# Patient Record
Sex: Female | Born: 1980 | Race: White | Hispanic: No | Marital: Married | Smoking: Never smoker
Health system: Southern US, Community
[De-identification: ages and names within clinical notes are randomized; demographics above are authoritative.]

## PROBLEM LIST (undated history)

## (undated) DIAGNOSIS — R0982 Postnasal drip: Secondary | ICD-10-CM

## (undated) DIAGNOSIS — E119 Type 2 diabetes mellitus without complications: Secondary | ICD-10-CM

## (undated) DIAGNOSIS — D219 Benign neoplasm of connective and other soft tissue, unspecified: Secondary | ICD-10-CM

## (undated) DIAGNOSIS — M7061 Trochanteric bursitis, right hip: Secondary | ICD-10-CM

## (undated) DIAGNOSIS — R1013 Epigastric pain: Secondary | ICD-10-CM

## (undated) DIAGNOSIS — G4700 Insomnia, unspecified: Secondary | ICD-10-CM

## (undated) DIAGNOSIS — Q782 Osteopetrosis: Secondary | ICD-10-CM

## (undated) DIAGNOSIS — R1012 Left upper quadrant pain: Secondary | ICD-10-CM

## (undated) DIAGNOSIS — J309 Allergic rhinitis, unspecified: Secondary | ICD-10-CM

## (undated) DIAGNOSIS — M545 Low back pain, unspecified: Secondary | ICD-10-CM

## (undated) DIAGNOSIS — R718 Other abnormality of red blood cells: Secondary | ICD-10-CM

## (undated) DIAGNOSIS — S92901A Unspecified fracture of right foot, initial encounter for closed fracture: Secondary | ICD-10-CM

## (undated) DIAGNOSIS — T7840XA Allergy, unspecified, initial encounter: Secondary | ICD-10-CM

## (undated) DIAGNOSIS — E282 Polycystic ovarian syndrome: Secondary | ICD-10-CM

## (undated) DIAGNOSIS — R519 Headache, unspecified: Secondary | ICD-10-CM

## (undated) DIAGNOSIS — Z6841 Body Mass Index (BMI) 40.0 and over, adult: Secondary | ICD-10-CM

## (undated) DIAGNOSIS — R7301 Impaired fasting glucose: Secondary | ICD-10-CM

## (undated) DIAGNOSIS — K219 Gastro-esophageal reflux disease without esophagitis: Secondary | ICD-10-CM

## (undated) HISTORY — DX: Polycystic ovarian syndrome: E28.2

## (undated) HISTORY — DX: Postnasal drip: R09.82

## (undated) HISTORY — DX: Allergic rhinitis, unspecified: J30.9

## (undated) HISTORY — DX: Unspecified fracture of right foot, initial encounter for closed fracture: S92.901A

## (undated) HISTORY — DX: Low back pain, unspecified: M54.50

## (undated) HISTORY — PX: NASAL SINUS SURGERY: SHX719

## (undated) HISTORY — PX: ABDOMINAL HYSTERECTOMY: SHX81

## (undated) HISTORY — DX: Other abnormality of red blood cells: R71.8

## (undated) HISTORY — DX: Body Mass Index (BMI) 40.0 and over, adult: Z684

## (undated) HISTORY — DX: Epigastric pain: R10.13

## (undated) HISTORY — DX: Benign neoplasm of connective and other soft tissue, unspecified: D21.9

## (undated) HISTORY — DX: Left upper quadrant pain: R10.12

## (undated) HISTORY — DX: Morbid (severe) obesity due to excess calories: E66.01

## (undated) HISTORY — DX: Osteopetrosis: Q78.2

## (undated) HISTORY — DX: Trochanteric bursitis, right hip: M70.61

## (undated) HISTORY — DX: Gastro-esophageal reflux disease without esophagitis: K21.9

## (undated) HISTORY — DX: Allergy, unspecified, initial encounter: T78.40XA

## (undated) HISTORY — DX: Impaired fasting glucose: R73.01

## (undated) HISTORY — DX: Low back pain: M54.5

## (undated) HISTORY — DX: Insomnia, unspecified: G47.00

---

## 2014-06-09 ENCOUNTER — Encounter: Payer: Self-pay | Admitting: Family Medicine

## 2014-06-25 ENCOUNTER — Encounter: Payer: Self-pay | Admitting: Family Medicine

## 2014-08-19 ENCOUNTER — Ambulatory Visit: Payer: Self-pay | Admitting: Family Medicine

## 2014-08-29 ENCOUNTER — Ambulatory Visit: Payer: Self-pay | Admitting: Unknown Physician Specialty

## 2015-02-11 ENCOUNTER — Ambulatory Visit: Payer: Self-pay | Admitting: Family Medicine

## 2015-04-08 DIAGNOSIS — G4700 Insomnia, unspecified: Secondary | ICD-10-CM | POA: Insufficient documentation

## 2015-04-08 DIAGNOSIS — M545 Low back pain, unspecified: Secondary | ICD-10-CM | POA: Insufficient documentation

## 2015-04-08 DIAGNOSIS — R7301 Impaired fasting glucose: Secondary | ICD-10-CM | POA: Insufficient documentation

## 2015-04-08 DIAGNOSIS — J309 Allergic rhinitis, unspecified: Secondary | ICD-10-CM | POA: Insufficient documentation

## 2015-04-08 DIAGNOSIS — E282 Polycystic ovarian syndrome: Secondary | ICD-10-CM | POA: Insufficient documentation

## 2015-07-06 ENCOUNTER — Ambulatory Visit (INDEPENDENT_AMBULATORY_CARE_PROVIDER_SITE_OTHER): Payer: 59 | Admitting: Family Medicine

## 2015-07-06 ENCOUNTER — Encounter: Payer: Self-pay | Admitting: Family Medicine

## 2015-07-06 VITALS — BP 105/74 | HR 85 | Wt 238.0 lb

## 2015-07-06 DIAGNOSIS — M545 Low back pain, unspecified: Secondary | ICD-10-CM

## 2015-07-06 DIAGNOSIS — J301 Allergic rhinitis due to pollen: Secondary | ICD-10-CM

## 2015-07-06 DIAGNOSIS — G4700 Insomnia, unspecified: Secondary | ICD-10-CM

## 2015-07-06 DIAGNOSIS — Q782 Osteopetrosis: Secondary | ICD-10-CM

## 2015-07-06 MED ORDER — BUDESONIDE 32 MCG/ACT NA SUSP
1.0000 | Freq: Every day | NASAL | Status: DC
Start: 2015-07-06 — End: 2017-06-08

## 2015-07-06 NOTE — Progress Notes (Signed)
BP 105/74 mmHg  Pulse 85  Wt 238 lb (107.956 kg)  SpO2 99%  LMP  (LMP Unknown)   Subjective:    Patient ID: Connie Sims, female    DOB: 10/14/1981, 34 y.o.   MRN: [id number]  HPI: Connie Sims is a 34 y.o. female  Chief Complaint  Patient presents with  . Insomnia   Not sleeping well; she goes to bed at 3:00 or 3:30 am and then goes to bed; lies there awake after talking to friend in [place]; tried Nyquil sleep aide which did not help; natural homeopathic relief has helped only a little bit; she works night shift, so that's part of the underlying problem; gets up 9 or 10 am; this has been long term, going to bed at 3 am and up at 10 am; just recently in the last week having trouble; total sleep has decreased; her back has been hurting; stayed home from work all last week because of her back; she was in bed most of last week with her back and did not feel safe going to work on 2 hours of sleep a night; she operates machinery at work; ball of pain in the left side; that comes and goes; moves one direction and there it is; lays there and waits for it to go away; she is going to chiropractor on Wednesday; she is not sure that the back pain is causing her to not sleep well; she has a new mattress just purchased in Sept; drinks caffeine; drinks two bottles of pepsi a day, 40 ounces total; she does not get jittery from caffeine; just likes soda; has last around 6 to 7 pm; tried melatonin but did not take at same time of night; not using much OTC medicine; no blood in the urine; pain level at worst 7 out of 10  Obesity; she is down 9 pounds from April, limiting intake some; not doing any walking; still drinking soda; no loose stools; tired right now; not routinely shaky or jittery  Relevant past medical, surgical, family and social history reviewed and updated as indicated. Interim medical history since our last visit reviewed. Allergies and medications reviewed and updated.  Review of Systems  Per  HPI unless specifically indicated above     Objective:    BP 105/74 mmHg  Pulse 85  Wt 238 lb (107.956 kg)  SpO2 99%  LMP  (LMP Unknown)  Wt Readings from Last 3 Encounters:  07/06/15 238 lb (107.956 kg)  04/08/15 247 lb (112.038 kg)    Physical Exam  Constitutional: She appears well-developed and well-nourished. No distress.  HENT:  Head: Normocephalic and atraumatic.  Eyes: EOM are normal. No scleral icterus.  Neck: No thyromegaly present.  Cardiovascular: Normal rate.   No murmur heard. Pulmonary/Chest: Effort normal. No respiratory distress. She has no wheezes.  Abdominal: Soft. She exhibits no distension.  Musculoskeletal: Normal range of motion. She exhibits no edema.       Lumbar back: She exhibits tenderness and pain. She exhibits normal range of motion, no bony tenderness, no swelling, no edema, no deformity and no spasm.  Neurological: She is alert. She displays normal reflexes. She exhibits normal muscle tone. Coordination normal.  Skin: Skin is warm and dry. She is not diaphoretic. No pallor.  Psychiatric: She has a normal mood and affect. Her behavior is normal. Judgment and thought content normal.    No results found for this or any previous visit.    Assessment & Plan:  Problem List Items Addressed This Visit      Respiratory   Allergic rhinitis    Trial of lower dose corticosteroid for symptoms at her request; see ENT        Musculoskeletal and Integument   Osteopetrosis    Noted; added to problem list        Other   Insomnia - Primary    Do not ever drive if tired; try melatonin 3 mg at bedtime exact same time of night for 3 weeks; avoid caffeine for 10 hours prior to bedtime; order sleep study for possible OSA      Relevant Orders   Ambulatory referral to ENT   Low back pain    Use aleve and see chiropractor          Follow up plan: Return in about 1 month (around 08/06/2015).

## 2015-07-06 NOTE — Assessment & Plan Note (Signed)
Trial of lower dose corticosteroid for symptoms at her request; see ENT

## 2015-07-06 NOTE — Assessment & Plan Note (Signed)
Do not ever drive if tired; try melatonin 3 mg at bedtime exact same time of night for 3 weeks; avoid caffeine for 10 hours prior to bedtime; order sleep study for possible OSA

## 2015-07-06 NOTE — Assessment & Plan Note (Signed)
Use aleve and see chiropractor

## 2015-07-06 NOTE — Patient Instructions (Addendum)
I'll recommend no caffeine within 10 hours of your anticipated bed time Try melatonin 3 mg at the exact same time of night for 3 weeks to reset your pineal gland Please do see your chiropractor for back pain I recommend over-the-counter naproxen (Aleve), 220 mg tablets and you can take one or two at a time every twelve hours; take with food, easier on the stomach I recommend no electronics in the bedroom Do not ever operate machinery within 8 hours of using muscle relaxants Proper lifting Keep up weight loss efforts I'll suggest cutting out soda completely or caffeine-free beverages GINA -- note for being out of work all last week and today Never drive when tired We'll have you see Dr. Tami Ribas for possible sleep apnea

## 2015-07-06 NOTE — Assessment & Plan Note (Signed)
Noted added to problem list.

## 2015-07-16 ENCOUNTER — Telehealth: Payer: Self-pay | Admitting: Family Medicine

## 2015-07-16 NOTE — Telephone Encounter (Signed)
Routing to provider  

## 2015-07-16 NOTE — Telephone Encounter (Signed)
Pt didn't want to come in for an appt she just basically wanted meds since we had seen her before for sleeping issues. She would like a call back at the number provided.

## 2015-07-17 MED ORDER — TRAZODONE HCL 50 MG PO TABS: 50.0000 mg | ORAL_TABLET | Freq: Every evening | ORAL | Status: DC | PRN

## 2015-07-17 NOTE — Telephone Encounter (Signed)
Rx for trazodone sent In addition to the medicine, avoid caffeine for 6-10 hours prior to bed, avoid electronics for 2 hours before bed, etc.

## 2015-07-17 NOTE — Telephone Encounter (Signed)
Patient notified

## 2015-07-30 ENCOUNTER — Encounter: Payer: Self-pay | Admitting: Family Medicine

## 2015-07-30 ENCOUNTER — Ambulatory Visit (INDEPENDENT_AMBULATORY_CARE_PROVIDER_SITE_OTHER): Payer: 59 | Admitting: Family Medicine

## 2015-07-30 VITALS — BP 119/78 | HR 66 | Temp 97.7°F | Wt 239.6 lb

## 2015-07-30 DIAGNOSIS — G4700 Insomnia, unspecified: Secondary | ICD-10-CM

## 2015-07-30 MED ORDER — TRAZODONE HCL 100 MG PO TABS: 100.0000 mg | ORAL_TABLET | Freq: Every evening | ORAL | Status: DC | PRN

## 2015-07-30 NOTE — Patient Instructions (Signed)
Insomnia Insomnia is frequent trouble falling and/or staying asleep. Insomnia can be a long term problem or a short term problem. Both are common. Insomnia can be a short term problem when the wakefulness is related to a certain stress or worry. Long term insomnia is often related to ongoing stress during waking hours and/or poor sleeping habits. Overtime, sleep deprivation itself can make the problem worse. Every little thing feels more severe because you are overtired and your ability to cope is decreased. CAUSES   Stress, anxiety, and depression.  Poor sleeping habits.  Distractions such as TV in the bedroom.  Naps close to bedtime.  Engaging in emotionally charged conversations before bed.  Technical reading before sleep.  Alcohol and other sedatives. They may make the problem worse. They can hurt normal sleep patterns and normal dream activity.  Stimulants such as caffeine for several hours prior to bedtime.  Pain syndromes and shortness of breath can cause insomnia.  Exercise late at night.  Changing time zones may cause sleeping problems (jet lag). It is sometimes helpful to have someone observe your sleeping patterns. They should look for periods of not breathing during the night (sleep apnea). They should also look to see how long those periods last. If you live alone or observers are uncertain, you can also be observed at a sleep clinic where your sleep patterns will be professionally monitored. Sleep apnea requires a checkup and treatment. Give your caregivers your medical history. Give your caregivers observations your family has made about your sleep.  SYMPTOMS   Not feeling rested in the morning.  Anxiety and restlessness at bedtime.  Difficulty falling and staying asleep. TREATMENT   Your caregiver may prescribe treatment for an underlying medical disorders. Your caregiver can give advice or help if you are using alcohol or other drugs for self-medication. Treatment  of underlying problems will usually eliminate insomnia problems.  Medications can be prescribed for short time use. They are generally not recommended for lengthy use.  Over-the-counter sleep medicines are not recommended for lengthy use. They can be habit forming.  You can promote easier sleeping by making lifestyle changes such as:  Using relaxation techniques that help with breathing and reduce muscle tension.  Exercising earlier in the day.  Changing your diet and the time of your last meal. No night time snacks.  Establish a regular time to go to bed.  Counseling can help with stressful problems and worry.  Soothing music and white noise may be helpful if there are background noises you cannot remove.  Stop tedious detailed work at least one hour before bedtime. HOME CARE INSTRUCTIONS   Keep a diary. Inform your caregiver about your progress. This includes any medication side effects. See your caregiver regularly. Take note of:  Times when you are asleep.  Times when you are awake during the night.  The quality of your sleep.  How you feel the next day. This information will help your caregiver care for you.  Get out of bed if you are still awake after 15 minutes. Read or do some quiet activity. Keep the lights down. Wait until you feel sleepy and go back to bed.  Keep regular sleeping and waking hours. Avoid naps.  Exercise regularly.  Avoid distractions at bedtime. Distractions include watching television or engaging in any intense or detailed activity like attempting to balance the household checkbook.  Develop a bedtime ritual. Keep a familiar routine of bathing, brushing your teeth, climbing into bed at the same   time each night, listening to soothing music. Routines increase the success of falling to sleep faster.  Use relaxation techniques. This can be using breathing and muscle tension release routines. It can also include visualizing peaceful scenes. You can  also help control troubling or intruding thoughts by keeping your mind occupied with boring or repetitive thoughts like the old concept of counting sheep. You can make it more creative like imagining planting one beautiful flower after another in your backyard garden.  During your day, work to eliminate stress. When this is not possible use some of the previous suggestions to help reduce the anxiety that accompanies stressful situations. MAKE SURE YOU:   Understand these instructions.  Will watch your condition.  Will get help right away if you are not doing well or get worse. Document Released: 12/09/2000 Document Revised: 03/05/2012 Document Reviewed: 01/09/2008 Exit[employer] Patient Information 2015 [employer]. This information is not intended to replace advice given to you by your health care provider. Make sure you discuss any questions you have with your health care provider.  

## 2015-07-30 NOTE — Assessment & Plan Note (Signed)
Discussed that 15-30 minutes after taking the medicine she should lay down in a dark room as the medicine will not act as a "sledge hammer", but will make it easier for her to fall asleep. We will increase her trazodone to 100mg  to see if that has an effect. She will see ENT for OSA evaluation. She will also follow up with PCP next week to see how increased dose is doing and if not doing well to discuss other options.

## 2015-07-30 NOTE — Progress Notes (Signed)
BP 119/78 mmHg  Pulse 66  Temp(Src) 97.7 F (36.5 C)  Wt 239 lb 9.6 oz (108.682 kg)  SpO2 99%  LMP 07/16/2015 (Approximate)   Subjective:    Patient ID: Connie Sims, female    DOB: [birth year], 34 y.o.   MRN: [id number]  HPI: Connie Sims is a 34 y.o. female  Chief Complaint  Patient presents with  . Insomnia   INSOMNIA- has not seen the ENT yet. Medicine is making her drowsy but not helping her sleep. Has been watching caffeine intake. Notes that she cannot get her mind to shut down. Denies any extra stress, notes that she has been feeling better and things are winding down. States that she is still only getting about 3-4 hours of sleep a night and that she is very frustrated. Wants something to help her sleep. She notes that she takes the trazodone and then has been doing quiet activities for about an hour while she waits for the medicine to "kick in". She notes that it doesn't feel like it does anything but make her mildly drowsy. She has taken it with benadryl, she has taken it with anti-histamines. Denies that these have ever had any type of effect on her.  Duration: 4-5 weeks Satisfied with sleep quality: no Difficulty falling asleep: yes Difficulty staying asleep: yes Waking a few hours after sleep onset: yes Early morning awakenings: no Daytime hypersomnolence: yes Wakes feeling refreshed: no Good sleep hygiene: no Depressed/anxious mood: no Recent stress: no Restless legs/nocturnal leg cramps: no Chronic pain/arthritis: no History of sleep study: no Treatments attempted: trazodone, melatonin, uinsom and benadryl   Relevant past medical, surgical, family and social history reviewed and updated as indicated. Interim medical history since our last visit reviewed. Allergies and medications reviewed and updated.  Review of Systems  Constitutional: Negative.   Respiratory: Negative.   Cardiovascular: Negative.   Psychiatric/Behavioral: Negative.     Per HPI unless  specifically indicated above     Objective:    BP 119/78 mmHg  Pulse 66  Temp(Src) 97.7 F (36.5 C)  Wt 239 lb 9.6 oz (108.682 kg)  SpO2 99%  LMP 07/16/2015 (Approximate)  Wt Readings from Last 3 Encounters:  07/30/15 239 lb 9.6 oz (108.682 kg)  07/06/15 238 lb (107.956 kg)  04/08/15 247 lb (112.038 kg)    Physical Exam  Constitutional: She is oriented to person, place, and time. She appears well-developed and well-nourished. No distress.  HENT:  Head: Normocephalic and atraumatic.  Right Ear: Hearing normal.  Left Ear: Hearing normal.  Nose: Nose normal.  Eyes: Conjunctivae and lids are normal. Right eye exhibits no discharge. Left eye exhibits no discharge. No scleral icterus.  Cardiovascular: Normal rate, regular rhythm and normal heart sounds.  Exam reveals no gallop and no friction rub.   No murmur heard. Pulmonary/Chest: Effort normal and breath sounds normal. No respiratory distress. She has no wheezes. She has no rales. She exhibits no tenderness.  Musculoskeletal: Normal range of motion.  Neurological: She is alert and oriented to person, place, and time.  Skin: Skin is warm, dry and intact. No rash noted. No erythema. No pallor.  Psychiatric: She has a normal mood and affect. Her speech is normal and behavior is normal. Judgment and thought content normal. Cognition and memory are normal.  Nursing note and vitals reviewed.   No results found for this or any previous visit.    Assessment & Plan:   Problem List Items Addressed This Visit  Other   Insomnia - Primary    Discussed that 15-30 minutes after taking the medicine she should lay down in a dark room as the medicine will not act as a "sledge hammer", but will make it easier for her to fall asleep. We will increase her trazodone to 100mg  to see if that has an effect. She will see ENT for OSA evaluation. She will also follow up with PCP next week to see how increased dose is doing and if not doing well to  discuss other options.           Follow up plan: Return for as scheduled with Dr. Sanda Klein.

## 2015-08-06 ENCOUNTER — Encounter: Payer: Self-pay | Admitting: Family Medicine

## 2015-08-06 ENCOUNTER — Ambulatory Visit (INDEPENDENT_AMBULATORY_CARE_PROVIDER_SITE_OTHER): Payer: 59 | Admitting: Family Medicine

## 2015-08-06 VITALS — BP 108/72 | HR 69 | Temp 97.0°F | Wt 244.0 lb

## 2015-08-06 DIAGNOSIS — G4700 Insomnia, unspecified: Secondary | ICD-10-CM

## 2015-08-06 MED ORDER — TRAZODONE HCL 100 MG PO TABS: 50.0000 mg | ORAL_TABLET | Freq: Every evening | ORAL | Status: DC | PRN

## 2015-08-06 MED ORDER — RAMELTEON 8 MG PO TABS: 8.0000 mg | ORAL_TABLET | Freq: Every evening | ORAL | Status: DC | PRN

## 2015-08-06 NOTE — Assessment & Plan Note (Addendum)
The trial of increased trazodone did not work; also having morning headaches; back that down to 50 mg at bedtime and add Rozerem; send to [place]; sleep hygiene discussed, see AVS for additional recommendations; she will call in about 10 days with report; we can try Belsomra or have her see sleep specialist if needed; she will consider

## 2015-08-06 NOTE — Progress Notes (Signed)
BP 108/72 mmHg  Pulse 69  Temp(Src) 97 F (36.1 C)  Wt 244 lb (110.678 kg)  SpO2 98%  LMP 07/16/2015 (Approximate)   Subjective:    Patient ID: Connie Sims, female    DOB: 1981/05/25, 34 y.o.   MRN: [id number]  HPI: Alvetta Hidrogo is a 34 y.o. female  Chief Complaint  Patient presents with  . Insomnia   She goes to bed at 3 am and gets up around 9 am; she is always more awake at night than in the morning; she saw Dr. Wynetta Emery last week and Dr. 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; same work schedule for ten years She used to go to bed at 3 am and sleep for 6-7 hours and have good sleep; this was an abrupt onset on a Friday There was no change in habits or work No headache, no slurred speech; no weakness in extremity, just numb in arm when she lays on it wrong  Duration: 6 weeks Satisfied with sleep quality: not ideal, not getting restorative sleep she is supposed to get; not staring at the ceiling now like she was Difficulty falling asleep: yes; she takes the pill every night at 2 am; some nights get to sleep at 3 am but some nights it is 4 am Difficulty staying asleep: yes Waking a few hours after sleep onset: yes Early morning awakenings: occasionally Daytime hypersomnolence: yes  Wakes feeling refreshed: no Good sleep hygiene: fair Depressed/anxious mood: no Recent stress: should be able to file for divorce next month Restless legs/nocturnal leg cramps: no Chronic pain/arthritis: no History of sleep study: no Treatments attempted: trazodone, melatonin, uinsom and benadryl  She has not heard from ENT yet She cannot shut her brain off sometimes; keeps pad of paper to keep list of things to get it out of her head She is interested in trying Rozerom  Relevant past medical, surgical, family and social history reviewed and updated as indicated. Interim medical  history since our last visit reviewed. Allergies and medications reviewed and updated.  Review of Systems  Constitutional: Positive for unexpected weight change (weight fluctuates by five to eight pounds pretty routinely).  Psychiatric/Behavioral: Positive for sleep disturbance. Negative for suicidal ideas, hallucinations, behavioral problems, confusion, self-injury, dysphoric mood, decreased concentration and agitation. The patient is not nervous/anxious and is not hyperactive.     Per HPI unless specifically indicated above     Objective:    BP 108/72 mmHg  Pulse 69  Temp(Src) 97 F (36.1 C)  Wt 244 lb (110.678 kg)  SpO2 98%  LMP 07/16/2015 (Approximate)  Wt Readings from Last 3 Encounters:  08/06/15 244 lb (110.678 kg)  07/30/15 239 lb 9.6 oz (108.682 kg)  07/06/15 238 lb (107.956 kg)    Physical Exam  Constitutional: She appears well-developed and well-nourished. No distress.  Eyes: EOM are normal. No scleral icterus.  Neck: No thyromegaly present.  Cardiovascular: Normal rate and regular rhythm.   Pulmonary/Chest: Effort normal and breath sounds normal.  Abdominal: She exhibits no distension.  Skin: No pallor.  Psychiatric: She has a normal mood and affect. Her behavior is normal. Judgment and thought content normal.    No results found for this or any previous visit.    Assessment & Plan:   Problem List Items Addressed This Visit      Other   Insomnia - Primary  The trial of increased trazodone did not work; also having morning headaches; back that down to 50 mg at bedtime and add Rozerem; send to [place]; sleep hygiene discussed, see AVS for additional recommendations; she will call in about 10 days with report; we can try Belsomra or have her see sleep specialist if needed; she will consider         Follow up plan: Return if symptoms worsen or fail to improve.  An after-visit summary was printed and given to the patient at [place].  Please see the  patient instructions which may contain other information and recommendations beyond what is mentioned above in the assessment and plan.  Patient was given info about ENT referral by our referral coordinator; she has an outstanding balance and will need to settle up with them  Meds ordered this encounter  Medications  . traZODone (DESYREL) 100 MG tablet    Sig: Take 0.5 tablets (50 mg total) by mouth at bedtime as needed for sleep.    Dispense:  15 tablet    Refill:  0    New lower dose; may leave on file if she does not need this filled yet  . ramelteon (ROZEREM) 8 MG tablet    Sig: Take 1 tablet (8 mg total) by mouth at bedtime as needed for sleep.    Dispense:  30 tablet    Refill:  0

## 2015-08-06 NOTE — Patient Instructions (Addendum)
Decrease trazodone back down to 50 mg at bedtime and we'll likely discontinue that in the near future Start Rozerem before bed Call me with an update in 10 days or so We can consider Belsomra We can also consider referral to a sleep specialist Avoid caffeine for 6-10 hours prior to sleep Continue good sleep hygiene  Insomnia Insomnia is frequent trouble falling and/or staying asleep. Insomnia can be a long term problem or a short term problem. Both are common. Insomnia can be a short term problem when the wakefulness is related to a certain stress or worry. Long term insomnia is often related to ongoing stress during waking hours and/or poor sleeping habits. Overtime, sleep deprivation itself can make the problem worse. Every little thing feels more severe because you are overtired and your ability to cope is decreased. CAUSES   Stress, anxiety, and depression.  Poor sleeping habits.  Distractions such as TV in the bedroom.  Naps close to bedtime.  Engaging in emotionally charged conversations before bed.  Technical reading before sleep.  Alcohol and other sedatives. They may make the problem worse. They can hurt normal sleep patterns and normal dream activity.  Stimulants such as caffeine for several hours prior to bedtime.  Pain syndromes and shortness of breath can cause insomnia.  Exercise late at night.  Changing time zones may cause sleeping problems (jet lag). It is sometimes helpful to have someone observe your sleeping patterns. They should look for periods of not breathing during the night (sleep apnea). They should also look to see how long those periods last. If you live alone or observers are uncertain, you can also be observed at a sleep clinic where your sleep patterns will be professionally monitored. Sleep apnea requires a checkup and treatment. Give your caregivers your medical history. Give your caregivers observations your family has made about your sleep.   SYMPTOMS   Not feeling rested in the morning.  Anxiety and restlessness at bedtime.  Difficulty falling and staying asleep. TREATMENT   Your caregiver may prescribe treatment for an underlying medical disorders. Your caregiver can give advice or help if you are using alcohol or other drugs for self-medication. Treatment of underlying problems will usually eliminate insomnia problems.  Medications can be prescribed for short time use. They are generally not recommended for lengthy use.  Over-the-counter sleep medicines are not recommended for lengthy use. They can be habit forming.  You can promote easier sleeping by making lifestyle changes such as:  Using relaxation techniques that help with breathing and reduce muscle tension.  Exercising earlier in the day.  Changing your diet and the time of your last meal. No night time snacks.  Establish a regular time to go to bed.  Counseling can help with stressful problems and worry.  Soothing music and white noise may be helpful if there are background noises you cannot remove.  Stop tedious detailed work at least one hour before bedtime. HOME CARE INSTRUCTIONS   Keep a diary. Inform your caregiver about your progress. This includes any medication side effects. See your caregiver regularly. Take note of:  Times when you are asleep.  Times when you are awake during the night.  The quality of your sleep.  How you feel the next day. This information will help your caregiver care for you.  Get out of bed if you are still awake after 15 minutes. Read or do some quiet activity. Keep the lights down. Wait until you feel sleepy and go back  to bed.  Keep regular sleeping and waking hours. Avoid naps.  Exercise regularly.  Avoid distractions at bedtime. Distractions include watching television or engaging in any intense or detailed activity like attempting to balance the household checkbook.  Develop a bedtime ritual. Keep a  familiar routine of bathing, brushing your teeth, climbing into bed at the same time each night, listening to soothing music. Routines increase the success of falling to sleep faster.  Use relaxation techniques. This can be using breathing and muscle tension release routines. It can also include visualizing peaceful scenes. You can also help control troubling or intruding thoughts by keeping your mind occupied with boring or repetitive thoughts like the old concept of counting sheep. You can make it more creative like imagining planting one beautiful flower after another in your backyard garden.  During your day, work to eliminate stress. When this is not possible use some of the previous suggestions to help reduce the anxiety that accompanies stressful situations. MAKE SURE YOU:   Understand these instructions.  Will watch your condition.  Will get help right away if you are not doing well or get worse. Document Released: 12/09/2000 Document Revised: 03/05/2012 Document Reviewed: 01/09/2008 [facility] Patient Information 2015 [place], [place]. This information is not intended to replace advice given to you by your health care provider. Make sure you discuss any questions you have with your health care provider.

## 2015-08-07 ENCOUNTER — Telehealth: Payer: Self-pay | Admitting: Family Medicine

## 2015-08-07 MED ORDER — ZALEPLON 5 MG PO CAPS: 5.0000 mg | ORAL_CAPSULE | Freq: Every evening | ORAL | Status: DC | PRN

## 2015-08-07 NOTE — Telephone Encounter (Signed)
Pt called stated Rozrem was declined by her insurance, wants to know if something different can be called in. Pharm is [facility] in [place]. Thanks.

## 2015-08-07 NOTE — Telephone Encounter (Signed)
Yes, we'll try another one called [facility] ready to manually fax to [place] Let her know we'll try this; call with any problems

## 2015-08-07 NOTE — Telephone Encounter (Signed)
Spoke with patient, informed her of medication Zaleplon (Sonata) 5 mg Capsule has been faxed to [place], patient verbalized understanding.

## 2015-08-07 NOTE — Telephone Encounter (Signed)
Forward to provider

## 2015-09-01 ENCOUNTER — Telehealth: Payer: Self-pay | Admitting: Family Medicine

## 2015-09-01 MED ORDER — ZALEPLON 10 MG PO CAPS: 10.0000 mg | ORAL_CAPSULE | Freq: Every evening | ORAL | Status: DC | PRN

## 2015-09-01 NOTE — Telephone Encounter (Signed)
Routing to provider  

## 2015-09-01 NOTE — Telephone Encounter (Signed)
Pt called stated the Trazadone is causing the pt to have headaches, would like to try something different. Pharm is [facility] in [place]. Thanks.

## 2015-09-01 NOTE — Telephone Encounter (Signed)
STOP the trazodone completely Increase sleeping pill from 5 mg to 10 mg at bedtime, new rx ready to send

## 2015-09-02 NOTE — Telephone Encounter (Signed)
Left message to call.

## 2015-09-02 NOTE — Telephone Encounter (Signed)
Patient notified

## 2015-10-23 ENCOUNTER — Ambulatory Visit (INDEPENDENT_AMBULATORY_CARE_PROVIDER_SITE_OTHER): Payer: 59 | Admitting: Family Medicine

## 2015-10-23 ENCOUNTER — Encounter: Payer: Self-pay | Admitting: Family Medicine

## 2015-10-23 ENCOUNTER — Telehealth: Payer: Self-pay

## 2015-10-23 VITALS — BP 111/77 | HR 69 | Temp 97.6°F | Ht 65.75 in | Wt 239.8 lb

## 2015-10-23 DIAGNOSIS — G4700 Insomnia, unspecified: Secondary | ICD-10-CM | POA: Diagnosis not present

## 2015-10-23 DIAGNOSIS — E781 Pure hyperglyceridemia: Secondary | ICD-10-CM

## 2015-10-23 DIAGNOSIS — M545 Low back pain, unspecified: Secondary | ICD-10-CM

## 2015-10-23 DIAGNOSIS — R7301 Impaired fasting glucose: Secondary | ICD-10-CM | POA: Diagnosis not present

## 2015-10-23 DIAGNOSIS — R5383 Other fatigue: Secondary | ICD-10-CM

## 2015-10-23 DIAGNOSIS — J301 Allergic rhinitis due to pollen: Secondary | ICD-10-CM

## 2015-10-23 DIAGNOSIS — L0293 Carbuncle, unspecified: Secondary | ICD-10-CM | POA: Diagnosis not present

## 2015-10-23 DIAGNOSIS — E669 Obesity, unspecified: Secondary | ICD-10-CM

## 2015-10-23 MED ORDER — LORATADINE 10 MG PO TABS: 10.0000 mg | ORAL_TABLET | Freq: Every day | ORAL | Status: DC

## 2015-10-23 MED ORDER — SULFAMETHOXAZOLE-TRIMETHOPRIM 800-160 MG PO TABS: 1.0000 | ORAL_TABLET | Freq: Two times a day (BID) | ORAL | Status: DC

## 2015-10-23 MED ORDER — CYCLOBENZAPRINE HCL 5 MG PO TABS: 5.0000 mg | ORAL_TABLET | Freq: Every day | ORAL | Status: DC

## 2015-10-23 NOTE — Assessment & Plan Note (Addendum)
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

## 2015-10-23 NOTE — Progress Notes (Signed)
BP 111/77 mmHg  Pulse 69  Temp(Src) 97.6 F (36.4 C)  Ht 5' 5.75" (1.67 m)  Wt 239 lb 12.8 oz (108.773 kg)  BMI 39.00 kg/m2  SpO2 99%  LMP 10/09/2015   Subjective:    Patient ID: Connie Sims, female    DOB: 1981/06/25, 34 y.o.   MRN: [id number]  HPI: Connie Sims is a 34 y.o. female  Chief Complaint  Patient presents with  . Follow-up  . Insomnia    Patient is still having trouble  . 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 She does get the myoclonic jerks as she is trying to fall asleep She wraps up like a burrito in her covers, not kicking them off No snoring; does not wake up with dry mouth or sore throat Does catch herself sputtering once in a great while Used to fall asleep and wake up just fine; then one day it just changed; does go to bed around 3 am, consistent; waking up at all different times; not set schedule at all She does not drink any caffeine after three o'clock in the afternoon She is active and moves a lot at work; no other regular exercise  Other meds reviewed She is not taking her birth control pills regularly; just takes one week prior to her period; she is getting that from gynecologist; they do her pap smears  She is taking metformin sometimes; she has not been taking that regularly; she says she knows she needs to; does not make her sick or nauseated  Her allergies have been bad; using nasal spray at night; using montelukast,  but needs to get back on that regularly, just got it refilled; she has an anti-histamine; with her insurance, she can get reimbursed if she has Rx; no potted plants in bedroom, just kitchen; she has pets but they don't get in her bedroom, stay in the garage  She is not taking her iron pills regularly; she needs to get her stuff sorted out she says as we finished reviewing her medicines  She gets boils, like nasty pimples in her groin; nasty and red and pus-filled; really getting tired of them; doesn't wax or shave, just trimmed with an electric razor; one just forming; sometimes gets size of a grape  PHQ-2 No flowsheet data found.  Relevant past medical, surgical, family and social history reviewed and updated as indicated. Interim medical history since our last visit reviewed. Allergies and medications reviewed and updated.  Review of Systems Per HPI unless specifically indicated above     Objective:    BP 111/77 mmHg  Pulse 69  Temp(Src) 97.6 F (36.4 C)  Ht 5' 5.75" (1.67 m)  Wt 239 lb 12.8 oz (108.773 kg)  BMI 39.00 kg/m2  SpO2 99%  LMP 10/09/2015  Wt Readings from Last 3 Encounters:  10/23/15 239 lb 12.8 oz (108.773 kg)  08/06/15 244 lb (110.678 kg)  07/30/15 239 lb 9.6 oz (  108.682 kg)    Physical Exam  Constitutional: She appears well-developed and well-nourished.  Eyes: EOM are normal.  Neck: No thyromegaly present.  Cardiovascular: Normal rate and regular rhythm.   Pulmonary/Chest: Effort normal and breath sounds normal.  Abdominal: She exhibits no distension.  Musculoskeletal: She exhibits no edema.  Neurological: She is alert.  Skin: No rash noted. No erythema.  Psychiatric: She has a normal mood and affect.   No results found for this or any previous visit.    Assessment & Plan:   Problem List Items Addressed This Visit      Respiratory   Allergic rhinitis    Get back on the singulair; rx for plain claritin; avoid D, explained why; already went through  testing; discussed environmental factors (allergen cover around mattress; pets in garage; no potted plants in the bedroom, etc.)        Endocrine   Impaired fasting glucose    Check fasting glucose and A1C today; weight loss encouraged; she declined referral to nutritionist      Relevant Orders   Comprehensive metabolic panel   Hgb U7O w/o eAG     Other   Obesity (BMI 30-39.9)    Patient declined nutrition referral; see AVS      Insomnia - Primary    Refer to sleep specialist; she has been on multiple medications; do not use muscle relaxant for sleep; use that for muscle spasms      Relevant Orders   Ambulatory referral to Neurology   Low back pain    Osteopetrosis; 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 for chronic back pain if this medicine helps, but I explained that the muscle relaxant is not meant to be used for sleep      Relevant Medications   cyclobenzaprine (FLEXERIL) 5 MG tablet   Hypertriglyceridemia    Last TG level reviewed, over 200; check today (fasting); weight loss would help; she is not interested in seeing nutritionist      Relevant Orders   Lipid Panel w/o Chol/HDL Ratio   Recurrent boils    Patient describes recurrent boils; nothing today to review; encouraged her to come in for evaluation the next time one comes up; I will send Rx to start in case it happens on a weekend or at night to hopefully prevent an expensive ER or urgent care visit; not sure if hidradenitis suppurativa; advised about danger of C diff, take probiotic or eat yogurt daily for a month to repopulate healthy germs in the gut      Relevant Medications   sulfamethoxazole-trimethoprim (BACTRIM DS,SEPTRA DS) 800-160 MG tablet    Other Visit Diagnoses    Other fatigue        Relevant Orders    CBC with Differential/Platelet    Vit D  25 hydroxy (rtn osteoporosis monitoring)       Follow up plan: Return in about 3 months (around 01/23/2016).  Meds ordered this encounter  Medications   . loratadine (CLARITIN) 10 MG tablet    Sig: Take 1 tablet (10 mg total) by mouth daily.    Dispense:  30 tablet    Refill:  11  . sulfamethoxazole-trimethoprim (BACTRIM DS,SEPTRA DS) 800-160 MG tablet    Sig: Take 1 tablet by mouth 2 (two) times daily.    Dispense:  14 tablet    Refill:  0  . cyclobenzaprine (FLEXERIL) 5 MG tablet    Sig: Take 1 tablet (5 mg total) by mouth at bedtime. PRN muscle  spasms    Dispense:  30 tablet    Refill:  0   Orders Placed This Encounter  Procedures  . CBC with Differential/Platelet  . Comprehensive metabolic panel  . Hgb A1c w/o eAG  . Lipid Panel w/o Chol/HDL Ratio  . Vit D  25 hydroxy (rtn osteoporosis monitoring)  . Ambulatory referral to Neurology    Face-to-face time with patient was more than 25 minutes, >50% time spent counseling and coordination of care

## 2015-10-23 NOTE — Assessment & Plan Note (Addendum)
Check fasting glucose and A1C today; weight loss encouraged; she declined referral to nutritionist

## 2015-10-23 NOTE — Assessment & Plan Note (Addendum)
Get back on the singulair; rx for plain claritin; avoid D, explained why; already went through testing; discussed environmental factors (allergen cover around mattress; pets in garage; no potted plants in the bedroom, etc.)

## 2015-10-23 NOTE — Assessment & Plan Note (Addendum)
Patient declined nutrition referral; see AVS

## 2015-10-23 NOTE — Patient Instructions (Addendum)
We will have you see the sleep specialist Use the antibiotics only if you get a bad boil on a weekend or night and can't come in; otherwise, please do let us evaluate and treat those in the office Use warm soaks or heating pad for 20 minutes, cloth between heating pad and skin We'll get labs today If you have not heard anything from my staff in a week about any orders/referrals/studies from today, please contact us here to follow-up [phone] Get back on regular medicines Check out the information at familydoctor.org entitled "What It Takes to Lose Weight" Try to lose between 1-2 pounds per week by taking in fewer calories and burning off more calories You can succeed by limiting portions, limiting foods dense in calories and fat, becoming more active, and drinking 8 glasses of water a day (64 ounces) Don't skip meals, especially breakfast, as skipping meals may alter your metabolism Do not use over-the-counter weight loss pills or gimmicks that claim rapid weight loss A healthy BMI (or body mass index) is between 18.5 and 24.9 You can calculate your ideal BMI at the [place] website [employer]fr Try to increase you activity level; being on your feet and active at work is wonderful, but you would also benefit from regular aerobic activity for at least 15 minutes at a time sustained, enough to get your heart rate up; build up gradually to 150 minutes per week

## 2015-10-23 NOTE — Assessment & Plan Note (Signed)
Last TG level reviewed, over 200; check today (fasting); weight loss would help; she is not interested in seeing nutritionist

## 2015-10-23 NOTE — Telephone Encounter (Signed)
Forward to Dr.Lada

## 2015-10-23 NOTE — Telephone Encounter (Signed)
Pharmacy notified to discontinue medication

## 2015-10-23 NOTE — Assessment & Plan Note (Addendum)
Refer to sleep specialist; she has been on multiple medications; do not use muscle relaxant for sleep; use that for muscle spasms

## 2015-10-23 NOTE — Assessment & Plan Note (Signed)
Patient describes recurrent boils; nothing today to review; encouraged her to come in for evaluation the next time one comes up; I will send Rx to start in case it happens on a weekend or at night to hopefully prevent an expensive ER or urgent care visit; not sure if hidradenitis suppurativa; advised about danger of C diff, take probiotic or eat yogurt daily for a month to repopulate healthy germs in the gut

## 2015-10-24 LAB — COMPREHENSIVE METABOLIC PANEL
ALT: 36 IU/L — ABNORMAL HIGH (ref 0–32)
AST: 21 IU/L (ref 0–40)
Albumin/Globulin Ratio: 1.6 (ref 1.1–2.5)
Albumin: 4.1 g/dL (ref 3.5–5.5)
Alkaline Phosphatase: 105 IU/L (ref 39–117)
BUN/Creatinine Ratio: 15 (ref 8–20)
BUN: 11 mg/dL (ref 6–20)
Bilirubin Total: 0.2 mg/dL (ref 0.0–1.2)
CO2: 24 mmol/L (ref 18–29)
Calcium: 9.1 mg/dL (ref 8.7–10.2)
Chloride: 100 mmol/L (ref 97–106)
Creatinine, Ser: 0.73 mg/dL (ref 0.57–1.00)
GFR calc Af Amer: 123 mL/min/{1.73_m2} (ref >59)
GFR calc non Af Amer: 107 mL/min/{1.73_m2} (ref >59)
Globulin, Total: 2.5 g/dL (ref 1.5–4.5)
Glucose: 92 mg/dL (ref 65–99)
Potassium: 4.5 mmol/L (ref 3.5–5.2)
Sodium: 139 mmol/L (ref 136–144)
Total Protein: 6.6 g/dL (ref 6.0–8.5)

## 2015-10-24 LAB — LIPID PANEL W/O CHOL/HDL RATIO
Cholesterol, Total: 169 mg/dL (ref 100–199)
HDL: 42 mg/dL (ref >39)
LDL Calculated: 102 mg/dL — ABNORMAL HIGH (ref 0–99)
Triglycerides: 127 mg/dL (ref 0–149)
VLDL Cholesterol Cal: 25 mg/dL (ref 5–40)

## 2015-10-24 LAB — CBC WITH DIFFERENTIAL/PLATELET
Basophils Absolute: 0 10*3/uL (ref 0.0–0.2)
Basos: 1 %
EOS (ABSOLUTE): 0.3 10*3/uL (ref 0.0–0.4)
Eos: 3 %
Hematocrit: 37.8 % (ref 34.0–46.6)
Hemoglobin: 12 g/dL (ref 11.1–15.9)
Immature Grans (Abs): 0 10*3/uL (ref 0.0–0.1)
Immature Granulocytes: 0 %
Lymphocytes Absolute: 1.8 10*3/uL (ref 0.7–3.1)
Lymphs: 21 %
MCH: 26.4 pg — ABNORMAL LOW (ref 26.6–33.0)
MCHC: 31.7 g/dL (ref 31.5–35.7)
MCV: 83 fL (ref 79–97)
Monocytes Absolute: 0.5 10*3/uL (ref 0.1–0.9)
Monocytes: 6 %
Neutrophils Absolute: 6.1 10*3/uL (ref 1.4–7.0)
Neutrophils: 69 %
Platelets: 313 10*3/uL (ref 150–379)
RBC: 4.55 x10E6/uL (ref 3.77–5.28)
RDW: 16.1 % — ABNORMAL HIGH (ref 12.3–15.4)
WBC: 8.8 10*3/uL (ref 3.4–10.8)

## 2015-10-24 LAB — HGB A1C W/O EAG: Hgb A1c MFr Bld: 6 % — ABNORMAL HIGH (ref 4.8–5.6)

## 2015-10-24 LAB — VITAMIN D 25 HYDROXY (VIT D DEFICIENCY, FRACTURES): Vit D, 25-Hydroxy: 17.6 ng/mL — ABNORMAL LOW (ref 30.0–100.0)

## 2015-10-27 ENCOUNTER — Telehealth: Payer: Self-pay | Admitting: Family Medicine

## 2015-10-27 MED ORDER — VITAMIN D (ERGOCALCIFEROL) 1.25 MG (50000 UNIT) PO CAPS: 50000.0000 [IU] | ORAL_CAPSULE | ORAL | Status: DC

## 2015-10-27 NOTE — Telephone Encounter (Signed)
Please let patient know that her vitamin D level is quite low; start Rx vitamin D once a week for four weeks, then OTC vitamin D3 2,000 iu daily Her A1C is 6, exactly the same as  August 2015; no worse; that's good news Kidney function is fine Cholesterol panel has improved since the last check One liver enzyme up just a few points, but nothing worrisome; just limit tylenol, avoid alcohol; keep working on weight loss

## 2015-10-28 NOTE — Telephone Encounter (Signed)
Left message to call.

## 2015-10-29 NOTE — Telephone Encounter (Signed)
Left message to call.

## 2015-10-30 NOTE — Telephone Encounter (Signed)
Left message to call.

## 2015-11-02 NOTE — Telephone Encounter (Signed)
Left message to call.

## 2015-11-03 NOTE — Telephone Encounter (Signed)
Left message to call.

## 2015-11-06 NOTE — Telephone Encounter (Signed)
Left message to call.

## 2015-11-09 NOTE — Telephone Encounter (Signed)
Dr. Sanda Klein, will you write her a lab results letter? Ive not been able to reach her by phone. Thanks!

## 2015-11-11 NOTE — Telephone Encounter (Signed)
Sending copy of phone note, with handwritten note at top to call with any questions

## 2015-11-25 ENCOUNTER — Encounter: Payer: Self-pay | Admitting: Neurology

## 2015-11-25 ENCOUNTER — Ambulatory Visit (INDEPENDENT_AMBULATORY_CARE_PROVIDER_SITE_OTHER): Payer: 59 | Admitting: Neurology

## 2015-11-25 VITALS — BP 110/80 | HR 70 | Resp 16 | Ht 65.75 in | Wt 237.0 lb

## 2015-11-25 DIAGNOSIS — E669 Obesity, unspecified: Secondary | ICD-10-CM

## 2015-11-25 DIAGNOSIS — G478 Other sleep disorders: Secondary | ICD-10-CM

## 2015-11-25 DIAGNOSIS — G4726 Circadian rhythm sleep disorder, shift work type: Secondary | ICD-10-CM | POA: Diagnosis not present

## 2015-11-25 NOTE — Patient Instructions (Addendum)
Based on your symptoms and your exam I believe you are at risk for obstructive sleep apnea or OSA, and I think we should proceed with a sleep study to determine whether you do or do not have OSA and how severe it is. If you have more than mild OSA, I want you to consider treatment with CPAP. Please remember, the risks and ramifications of moderate to severe obstructive sleep apnea or OSA are: Cardiovascular disease, including congestive heart failure, stroke, difficult to control hypertension, arrhythmias, and even type 2 diabetes has been linked to untreated OSA. Sleep apnea causes disruption of sleep and sleep deprivation in most cases, which, in turn, can cause recurrent headaches, problems with memory, mood, concentration, focus, and vigilance. Most people with untreated sleep apnea report excessive daytime sleepiness, which can affect their ability to drive. Please do not drive if you feel sleepy.   I will likely see you back after your sleep study to go over the test results and where to go from there. We will call you after your sleep study to advise about the results (most likely, you will hear from Beverlee Nims, my nurse) and to set up an appointment at the time, as necessary.    Our sleep lab administrative assistant, Arrie Aran will meet with you or call you to schedule your sleep study. If you don't hear back from her by next week please feel free to call her at [phone]. This is her direct line and please leave a message with your phone number to call back if you get the voicemail box. She will call back as soon as possible.    You can try Melatonin at night for sleep: take 3 to 5 mg one to two hours before your bedtime.   Please remember to try to maintain good sleep hygiene, which means: Keep a regular sleep and wake schedule, try not to exercise or have a meal within 2 hours of your bedtime, try to keep your bedroom conducive for sleep, that is, cool and dark, without light distractors such as an  illuminated alarm clock, and refrain from watching TV right before sleep or in the middle of the night and do not keep the TV or radio on during the night. Also, try not to use or play on electronic devices at bedtime, such as your cell phone, tablet PC or laptop. If you like to read at bedtime on an electronic device, try to dim the background light as much as possible. Do not eat in the middle of the night.

## 2015-11-25 NOTE — Progress Notes (Signed)
Subjective:    Patient ID: Connie Sims is a 34 y.o. female.  HPI     Star Age, MD, PhD [facility] [address], [address] P.O. [place], [place] [address]  Dear Dr. Sanda Klein,   I saw your patient, Connie Sims, upon your kind request in my neurologic clinic today for initial consultation of her sleep disturbance, in particular, difficulty initiating sleep for the past few months. The patient is unaccompanied today. As you know, Connie Sims is a 34 year old right-handed woman with an underlying medical history of dyslipidemia, impaired fasting glucose, allergic rhinitis, low back pain (on prn Flexeril), and obesity, who reports trouble going to sleep symptoms started 2 or 3 months ago. She works 2:30 PM to 11 PM. Her bedtime is typically around 2 AM. She has primarily difficulty falling asleep. She does not know if she snores because she lives alone. She does not watch TV in bed but does have background noise which she feels helps her go to sleep. She has no pets in her bedroom. Her Epworth sleepiness score is 4 out of 24 today, her fatigue score is 24 out of 63.  She lives alone, is divorced and has been separated for over a year, and has no children. She works full-time and is on her feet a lot at work. She has had the same schedule and the same line of work for the past 10 years. She drinks caffeine in the form of Cola, about 40 ounces per day. She is a nonsmoker and does not drink alcohol. She tried trazodone when she first started having trouble with her sleep. She took it for about a month but had recurrent headaches and therefore stopped it. More recently she tried sonata up to the maximum dose which did not help. Over-the-counter medications tried include Advil PM and Zquil. She also tried melatonin briefly for a few days but did not take it on a schedule and did not think it was helpful. She has no family history of obstructive sleep apnea. She denies restless leg  symptoms but does have back pain sometimes and takes Flexeril as needed. She does not take it every day. She denies morning headaches or nocturia.   Her Past Medical History Is Significant For: Past Medical History  Diagnosis Date  . Congenital osteopetrosis     previously managed by specialist at [facility], periodic hearing tests  . PCOS (polycystic ovarian syndrome)   . Allergic rhinitis   . Morbid obesity with BMI of 40.0-44.9, adult ([place])   . Insomnia   . Low back pain   . Foot fracture, right     Jones fx 5th metatarsal  . Trochanteric bursitis of right hip   . Microcytosis   . Allergic rhinitis with postnasal drip   . Impaired fasting glucose   . Epigastric pain   . LUQ pain     Her Past Surgical History Is Significant For: Past Surgical History  Procedure Laterality Date  . Nasal sinus surgery      Her Family History Is Significant For: Family History  Problem Relation Age of Onset  . Liver disease Mother     fatty liver, s/p liver biopsy  . Alcohol abuse Mother   . Hypertension Maternal Grandmother   . Glaucoma Maternal Grandmother   . Diabetes Maternal Grandfather   . Heart disease Maternal Grandfather     Her Social History Is Significant For: Social History   Social History  . Marital Status: Divorced  Spouse Name: N/A  . Number of Children: 0  . Years of Education: Masters   Social History Main Topics  . Smoking status: Never Smoker   . Smokeless tobacco: Never Used  . Alcohol Use: No  . Drug Use: No  . Sexual Activity: Yes    Birth Control/ Protection: Condom, Pill   Other Topics Concern  . None   Social History Narrative   Drinks 2- 20oz pepsi a day    Her Allergies Are:  No Known Allergies:   Her Current Medications Are:  Outpatient Encounter Prescriptions as of 11/25/2015  Medication Sig  . Biotin 10 MG CAPS Take by mouth.  . budesonide (RHINOCORT AQUA) 32 MCG/ACT nasal spray Place 1 spray into both nostrils daily.  .  cholecalciferol (VITAMIN D) 400 UNITS TABS tablet Take 400 Units by mouth.  . cyclobenzaprine (FLEXERIL) 5 MG tablet Take 1 tablet (5 mg total) by mouth at bedtime. PRN muscle spasms  . Ferrous Sulfate (IRON) 325 (65 FE) MG TABS Take by mouth daily as needed.   . loratadine (CLARITIN) 10 MG tablet Take 1 tablet (10 mg total) by mouth daily.  . metFORMIN (GLUCOPHAGE) 500 MG tablet Take 1,000 mg by mouth at bedtime.  . montelukast (SINGULAIR) 10 MG tablet Take 10 mg by mouth daily.  . norethindrone-ethinyl estradiol (MICROGESTIN,JUNEL,LOESTRIN) 1-20 MG-MCG tablet Take 1 tablet by mouth daily.  Marland Kitchen sulfamethoxazole-trimethoprim (BACTRIM DS,SEPTRA DS) 800-160 MG tablet Take 1 tablet by mouth 2 (two) times daily.  . Vitamin D, Ergocalciferol, (DRISDOL) 50000 UNITS CAPS capsule Take 1 capsule (50,000 Units total) by mouth every 7 (seven) days.   No facility-administered encounter medications on file as of 11/25/2015.  :  Review of Systems:  Out of a complete 14 point review of systems, all are reviewed and negative with the exception of these symptoms as listed below:   Review of Systems  HENT: Positive for rhinorrhea and tinnitus.        Spinning sensation   Allergic/Immunologic: Positive for environmental allergies.  Neurological: Positive for headaches.       Sometimes wakes up choking, wakes up feeling tired, headaches, works 2:30pm-11pm, feels tired before work, has trouble falling asleep after work.    Epworth Sleepiness Scale 0= would never doze 1= slight chance of dozing 2= moderate chance of dozing 3= high chance of dozing  Sitting and reading:1 Watching TV:1 Sitting inactive in a public place (ex. Theater or meeting):0 As a passenger in a car for an hour without a break:1 Lying down to rest in the afternoon:1 Sitting and talking to someone:0 Sitting quietly after lunch (no alcohol):0 In a car, while stopped in traffic:0 Total:4 Objective:  Neurologic Exam  Physical  Exam Physical Examination:   Filed Vitals:   11/25/15 1011  BP: 110/80  Pulse: 70  Resp: 16   General Examination: The patient is a very pleasant 34 y.o. female in no acute distress. She appears well-developed and well-nourished and well groomed.   HEENT: Normocephalic, atraumatic, pupils are equal, round and reactive to light and accommodation. Funduscopic exam is normal with sharp disc margins noted. Extraocular tracking is good without limitation to gaze excursion or nystagmus noted. Normal smooth pursuit is noted. Hearing is grossly intact. Tympanic membranes are clear bilaterally. Face is symmetric with normal facial animation and normal facial sensation. Speech is clear with no dysarthria noted. There is no hypophonia. There is no lip, neck/head, jaw or voice tremor. Neck is supple with full range of passive and  active motion. There are no carotid bruits on auscultation. Oropharynx exam reveals: mild mouth dryness, good dental hygiene and mild airway crowding, duthicker tongue, narrow airway entry and tonsils in place, these are 1+ bilaterally. Mallampati is class II. Tongue protrudes centrally and palate elevates symmetrically. Neck size is 15.25 inches. She has a Mild overbite. Nasal inspection reveals no significant nasal mucosal bogginess or redness and no septal deviation.   Chest: Clear to auscultation without wheezing, rhonchi or crackles noted.  Heart: S1+S2+0, regular and normal without murmurs, rubs or gallops noted.   Abdomen: Soft, non-tender and non-distended with normal bowel sounds appreciated on auscultation.  Extremities: There is no pitting edema in the distal lower extremities bilaterally. Pedal pulses are intact.  Skin: Warm and dry without trophic changes noted. There are no varicose veins.  Musculoskeletal: exam reveals no obvious joint deformities, tenderness or joint swelling or erythema.   Neurologically:  Mental status: The patient is awake, alert and  oriented in all 4 spheres. Her immediate and remote memory, attention, language skills and fund of knowledge are appropriate. There is no evidence of aphasia, agnosia, apraxia or anomia. Speech is clear with normal prosody and enunciation. Thought process is linear. Mood is normal and affect is normal.  Cranial nerves II - XII are as described above under HEENT exam. In addition: shoulder shrug is normal with equal shoulder height noted. Motor exam: Normal bulk, strength and tone is noted. There is no drift, tremor or rebound. Romberg is negative. Reflexes are 2+ throughout. Babinski: Toes are flexor bilaterally. Fine motor skills and coordination: intact with normal finger taps, normal hand movements, normal rapid alternating patting, normal foot taps and normal foot agility.  Cerebellar testing: No dysmetria or intention tremor on finger to nose testing. Heel to shin is unremarkable bilaterally. There is no truncal or gait ataxia.  Sensory exam: intact to light touch, pinprick, vibration, temperature sense in the upper and lower extremities.  Gait, station and balance: She stands easily. No veering to one side is noted. No leaning to one side is noted. Posture is age-appropriate and stance is narrow based. Gait shows normal stride length and normal pace. No problems turning are noted. She turns en bloc. Tandem walk is unremarkable.   Assessment and Plan:   In summary, Connie Sims is a very pleasant 34 y.o.-year old female with an underlying medical history of dyslipidemia, impaired fasting glucose, allergic rhinitis, low back pain, and obesity, who reports difficulty with her sleep. For the past 2 or 3 months. Her history and physical exam are somewhat concerning for obstructive sleep apnea (OSA). I had a long chat with the patient about my findings and the diagnosis of OSA, its prognosis and treatment options. We talked about medical treatments, surgical interventions and non-pharmacological approaches.  I explained in particular the risks and ramifications of untreated moderate to severe OSA, especially with respect to developing cardiovascular disease down the Road, including congestive heart failure, difficult to treat hypertension, cardiac arrhythmias, or stroke. Even type 2 diabetes has, in part, been linked to untreated OSA. Symptoms of untreated OSA include daytime sleepiness, memory problems, mood irritability and mood disorder such as depression and anxiety, lack of energy, as well as recurrent headaches, especially morning headaches. We talked about trying to maintain a healthy lifestyle in general, as well as the importance of weight control. I encouraged the patient to eat healthy, exercise daily and keep well hydrated, to keep a scheduled bedtime and wake time routine, to not skip  any meals and eat healthy snacks in between meals. I advised the patient not to drive when feeling sleepy. She is encouraged to reduce her caffeine intake and increase her water intake. She is at this point advised to try melatonin again, 3-5 mg, 1-2 hours before bedtime. She is reminded to try to keep a schedule.  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 With time, and with age related changes, this can start affecting sleep. I recommended the following at this time: sleep study (with potential positive airway pressure titration. We will score hypopneas at 4% and split the sleep study into diagnostic and treatment portion, if the estimated. 2 hour AHI is >20/h).  I explained to her that my role is primarily to look for an underlying organic sleep disorder.   I explained the sleep test procedure to the patient and also outlined possible surgical and non-surgical treatment options of OSA, including the use of a custom-made dental device (which would require a referral to a specialist dentist or oral surgeon), upper airway surgical options, such as pillar implants,  radiofrequency surgery, tongue base surgery, and UPPP (which would involve a referral to an ENT surgeon). Rarely, jaw surgery such as mandibular advancement may be considered.  I also explained the CPAP treatment option to the patient, who indicated that she would be willing to try CPAP if the need arises. I explained the importance of being compliant with PAP treatment, not only for insurance purposes but primarily to improve Her symptoms, and for the patient's long term health benefit, including to reduce Her cardiovascular risks. I answered all her questions today and the patient was in agreement. I would like to see her back after the sleep study is completed and encouraged her to call with any interim questions, concerns, problems or updates.   Thank you very much for allowing me to participate in the care of this nice patient. If I can be of any further assistance to you please do not hesitate to call me at [phone].  Sincerely,   Star Age, MD, PhD

## 2016-01-15 ENCOUNTER — Ambulatory Visit (INDEPENDENT_AMBULATORY_CARE_PROVIDER_SITE_OTHER): Payer: 59 | Admitting: Neurology

## 2016-01-15 DIAGNOSIS — G478 Other sleep disorders: Secondary | ICD-10-CM | POA: Diagnosis not present

## 2016-01-15 DIAGNOSIS — G4761 Periodic limb movement disorder: Secondary | ICD-10-CM

## 2016-01-15 DIAGNOSIS — R0683 Snoring: Secondary | ICD-10-CM

## 2016-01-15 DIAGNOSIS — G4720 Circadian rhythm sleep disorder, unspecified type: Secondary | ICD-10-CM

## 2016-01-15 DIAGNOSIS — G479 Sleep disorder, unspecified: Secondary | ICD-10-CM

## 2016-01-16 NOTE — Sleep Study (Signed)
Please see the scanned sleep study interpretation located in the procedure tab within the chart review section.   

## 2016-01-21 ENCOUNTER — Telehealth: Payer: Self-pay | Admitting: Neurology

## 2016-01-21 NOTE — Telephone Encounter (Signed)
Patient referred by Dr. Sanda Klein, seen by me on 11/25/15, diagnostic PSG on 01/15/16.   Please call and notify the patient that the recent sleep study did not show any significant obstructive sleep apnea, but she had significant leg twitching, which was somewhat disruptive to her sleep. This can be seen in patients with RLS. Please inform patient that I would like to go over the details of the study during a follow up appointment. Arrange a followup appointment. Also, route or fax report to PCP and referring MD, if other than PCP.  Once you have spoken to patient, you can close this encounter.   Thanks,  Star Age, MD, PhD [facility])

## 2016-01-25 ENCOUNTER — Ambulatory Visit: Payer: 59 | Admitting: Family Medicine

## 2016-01-25 ENCOUNTER — Encounter: Payer: Self-pay | Admitting: Family Medicine

## 2016-01-25 NOTE — Telephone Encounter (Signed)
I spoke to patient and she is aware of results and recommendations. She voiced understanding and states that she has to move due to lower back pain. She declined a f/u appt at this time but was advised to call us back if she changed her mind. Patient is aware that we will send copy to PCP.

## 2016-02-01 ENCOUNTER — Encounter: Payer: Self-pay | Admitting: Family Medicine

## 2016-02-01 ENCOUNTER — Ambulatory Visit (INDEPENDENT_AMBULATORY_CARE_PROVIDER_SITE_OTHER): Payer: 59 | Admitting: Family Medicine

## 2016-02-01 VITALS — BP 103/69 | HR 81 | Temp 97.7°F | Ht 64.75 in | Wt 239.0 lb

## 2016-02-01 DIAGNOSIS — R740 Nonspecific elevation of levels of transaminase and lactic acid dehydrogenase [LDH]: Secondary | ICD-10-CM

## 2016-02-01 DIAGNOSIS — J301 Allergic rhinitis due to pollen: Secondary | ICD-10-CM

## 2016-02-01 DIAGNOSIS — E559 Vitamin D deficiency, unspecified: Secondary | ICD-10-CM

## 2016-02-01 DIAGNOSIS — R718 Other abnormality of red blood cells: Secondary | ICD-10-CM | POA: Insufficient documentation

## 2016-02-01 DIAGNOSIS — R7301 Impaired fasting glucose: Secondary | ICD-10-CM | POA: Diagnosis not present

## 2016-02-01 DIAGNOSIS — G4700 Insomnia, unspecified: Secondary | ICD-10-CM

## 2016-02-01 DIAGNOSIS — E282 Polycystic ovarian syndrome: Secondary | ICD-10-CM

## 2016-02-01 DIAGNOSIS — R7401 Elevation of levels of liver transaminase levels: Secondary | ICD-10-CM | POA: Insufficient documentation

## 2016-02-01 DIAGNOSIS — Z1239 Encounter for other screening for malignant neoplasm of breast: Secondary | ICD-10-CM | POA: Insufficient documentation

## 2016-02-01 DIAGNOSIS — G4761 Periodic limb movement disorder: Secondary | ICD-10-CM

## 2016-02-01 DIAGNOSIS — Z114 Encounter for screening for human immunodeficiency virus [HIV]: Secondary | ICD-10-CM | POA: Diagnosis not present

## 2016-02-01 NOTE — Assessment & Plan Note (Signed)
-

## 2016-02-01 NOTE — Progress Notes (Signed)
BP 103/69 mmHg  Pulse 81  Temp(Src) 97.7 F (36.5 C)  Ht 5' 4.75" (1.645 m)  Wt 239 lb (108.41 kg)  BMI 40.06 kg/m2  SpO2 98%  LMP 12/19/2015 (Approximate)   Subjective:    Patient ID: Connie Sims, female    DOB: Nov 11, 1981, 35 y.o.   MRN: [id number]  HPI: Connie Sims is a 35 y.o. female  Chief Complaint  Patient presents with  . Hyperlipidemia    routine follow up  . Obesity    routine follow up  . IFG    routine follow up and labs  . Sleep Study    she had it done at the of Jan.  . Other    she is willing to have HIV checked today   She had her sleep study; she has PLMD; no recommendations per patient; she did not schedule f/u with the sleep specialist  Checking HIV today for screening per USPSTF guidelines  Impaired fasting glucose; last A1c was in October and it was 6; sometimes gets; dry mouth; astigmatism; last eye visit was a year ago, due; she'll call and will schedule her own appt Lab Results  Component Value Date   HGBA1C 6.0* 10/23/2015   Sinuses stopped up and breathes through mouth at night; tastes draingage in mouth  Cholesterol; she does not buy much bacon, tries to use ground [place]; 2-3 eggs per week  Slightly elevated SGPT on previous labs; reviewed with patient Lab Results  Component Value Date   ALT 36* 10/23/2015   Vitamin D deficiency; she bought the supplement, has it, trying to take, but misses some doses; not much sunlight  Relevant past medical, surgical, family and social history reviewed and updated as indicated. Interim medical history since our last visit reviewed. Allergies and medications reviewed and updated.  Review of Systems  Per HPI unless specifically indicated above     Objective:    BP 103/69 mmHg  Pulse 81  Temp(Src) 97.7 F (36.5 C)  Ht 5' 4.75" (1.645 m)  Wt 239 lb (108.41 kg)  BMI 40.06 kg/m2  SpO2 98%  LMP 12/19/2015 (Approximate)  Wt Readings from Last 3 Encounters:  02/01/16 239 lb (108.41  kg)  11/25/15 237 lb (107.502 kg)  10/23/15 239 lb 12.8 oz (108.773 kg)    Physical Exam  Constitutional: She appears well-developed and well-nourished.  HENT:  Mouth/Throat: Mucous membranes are normal.  Eyes: EOM are normal. No scleral icterus.  Cardiovascular: Normal rate and regular rhythm.   Pulmonary/Chest: Effort normal and breath sounds normal.  Psychiatric: She has a normal mood and affect. Her behavior is normal.   Results for orders placed or performed in visit on 10/23/15  CBC with Differential/Platelet  Result Value Ref Range   WBC 8.8 3.4 - 10.8 x10E3/uL   RBC 4.55 3.77 - 5.28 x10E6/uL   Hemoglobin 12.0 11.1 - 15.9 g/dL   Hematocrit 37.8 34.0 - 46.6 %   MCV 83 79 - 97 fL   MCH 26.4 (L) 26.6 - 33.0 pg   MCHC 31.7 31.5 - 35.7 g/dL   RDW 16.1 (H) 12.3 - 15.4 %   Platelets 313 150 - 379 x10E3/uL   Neutrophils 69 %   Lymphs 21 %   Monocytes 6 %   Eos 3 %   Basos 1 %   Neutrophils Absolute 6.1 1.4 - 7.0 x10E3/uL   Lymphocytes Absolute 1.8 0.7 - 3.1 x10E3/uL   Monocytes Absolute 0.5 0.1 - 0.9 x10E3/uL  EOS (ABSOLUTE) 0.3 0.0 - 0.4 x10E3/uL   Basophils Absolute 0.0 0.0 - 0.2 x10E3/uL   Immature Granulocytes 0 %   Immature Grans (Abs) 0.0 0.0 - 0.1 x10E3/uL  Comprehensive metabolic panel  Result Value Ref Range   Glucose 92 65 - 99 mg/dL   BUN 11 6 - 20 mg/dL   Creatinine, Ser 0.73 0.57 - 1.00 mg/dL   GFR calc non Af Amer 107 >59 mL/min/1.73   GFR calc Af Amer 123 >59 mL/min/1.73   BUN/Creatinine Ratio 15 8 - 20   Sodium 139 136 - 144 mmol/L   Potassium 4.5 3.5 - 5.2 mmol/L   Chloride 100 97 - 106 mmol/L   CO2 24 18 - 29 mmol/L   Calcium 9.1 8.7 - 10.2 mg/dL   Total Protein 6.6 6.0 - 8.5 g/dL   Albumin 4.1 3.5 - 5.5 g/dL   Globulin, Total 2.5 1.5 - 4.5 g/dL   Albumin/Globulin Ratio 1.6 1.1 - 2.5   Bilirubin Total 0.2 0.0 - 1.2 mg/dL   Alkaline Phosphatase 105 39 - 117 IU/L   AST 21 0 - 40 IU/L   ALT 36 (H) 0 - 32 IU/L  Hgb A1c w/o eAG  Result Value Ref  Range   Hgb A1c MFr Bld 6.0 (H) 4.8 - 5.6 %  Lipid Panel w/o Chol/HDL Ratio  Result Value Ref Range   Cholesterol, Total 169 100 - 199 mg/dL   Triglycerides 127 0 - 149 mg/dL   HDL 42 >39 mg/dL   VLDL Cholesterol Cal 25 5 - 40 mg/dL   LDL Calculated 102 (H) 0 - 99 mg/dL  Vit D  25 hydroxy (rtn osteoporosis monitoring)  Result Value Ref Range   Vit D, 25-Hydroxy 17.6 (L) 30.0 - 100.0 ng/mL      Assessment & Plan:   Problem List Items Addressed This Visit      Respiratory   Allergic rhinitis    It is possible that the antihistamine is making the PLMD worse, but patient says she needs it to breathe        Endocrine   PCOS (polycystic ovarian syndrome)    On metformin for PCOS and IFG      Impaired fasting glucose    Not time for A1c yet; will check on or after April 23, 2016        Other   Insomnia    Sleep study done recently; no OSA; she has PLMD; managed by sleep specialist      Screening for HIV (human immunodeficiency virus)    One-time check today per USPSTF guidelines      Relevant Orders   HIV antibody   PLMD (periodic limb movement disorder)    Hand-out printed from [facility]; suggested she contact the doctor who is managing her sleep to see a dopamine agonist might be helpful; I'll also check a ferritin today as well as a CBC, as low iron can worsen symptoms      Microcytosis - Primary    Check CBC and ferritin today      Relevant Orders   CBC with Differential/Platelet   Ferritin   Vitamin D deficiency    Check level today; will continue or increase supplement as labs necessitate      Relevant Orders   VITAMIN D 25 Hydroxy (Vit-D Deficiency, Fractures)   Elevated alanine aminotransferase (ALT) level    Mild elevation; likely fatty liver; will recheck today; work on modest weight loss, limit fats  Relevant Orders   ALT      Follow up plan: Return in about 3 months (around 04/25/2016) for thirty minute follow-up with fasting  labs.  Orders Placed This Encounter  Procedures  . HIV antibody  . VITAMIN D 25 Hydroxy (Vit-D Deficiency, Fractures)  . CBC with Differential/Platelet  . Ferritin  . ALT   An after-visit summary was printed and given to the patient at [place].  Please see the patient instructions which may contain other information and recommendations beyond what is mentioned above in the assessment and plan.

## 2016-02-01 NOTE — Assessment & Plan Note (Signed)
On metformin for PCOS and IFG

## 2016-02-01 NOTE — Assessment & Plan Note (Signed)
Mild elevation; likely fatty liver; will recheck today; work on modest weight loss, limit fats

## 2016-02-01 NOTE — Assessment & Plan Note (Signed)
Check level today; will continue or increase supplement as labs necessitate

## 2016-02-01 NOTE — Assessment & Plan Note (Signed)
Sleep study done recently; no OSA; she has PLMD; managed by sleep specialist

## 2016-02-01 NOTE — Assessment & Plan Note (Signed)
Hand-out printed from [facility]; suggested she contact the doctor who is managing her sleep to see a dopamine agonist might be helpful; I'll also check a ferritin today as well as a CBC, as low iron can worsen symptoms

## 2016-02-01 NOTE — Assessment & Plan Note (Signed)
It is possible that the antihistamine is making the PLMD worse, but patient says she needs it to breathe

## 2016-02-01 NOTE — Assessment & Plan Note (Signed)
Not time for A1c yet; will check on or after April 23, 2016

## 2016-02-01 NOTE — Assessment & Plan Note (Signed)
One-time check today per USPSTF guidelines

## 2016-02-01 NOTE — Patient Instructions (Addendum)
Your next A1c and cholesterol check will be due on or after April 29th  Let's check labs today and then others in late April Please do call the sleep specialist about medicine for the periodic limb movement disorder if you are interested  Try to limit saturated fats in your diet (bologna, hot dogs, barbeque, cheeseburgers, hamburgers, steak, bacon, sausage, cheese, etc.) and get more fresh fruits, vegetables, and whole grains  Check out the information at familydoctor.org entitled "What It Takes to Lose Weight" Try to lose between 1-2 pounds per week by taking in fewer calories and burning off more calories You can succeed by limiting portions, limiting foods dense in calories and fat, becoming more active, and drinking 8 glasses of water a day (64 ounces) Don't skip meals, especially breakfast, as skipping meals may alter your metabolism Do not use over-the-counter weight loss pills or gimmicks that claim rapid weight loss A healthy BMI (or body mass index) is between 18.5 and 24.9 You can calculate your ideal BMI at the Ranger website ClubMonetize.fr  Try to get 1000 iu of vitamin D daily, or more as we recommend after today's labs

## 2016-02-02 ENCOUNTER — Encounter: Payer: Self-pay | Admitting: Family Medicine

## 2016-02-02 LAB — HIV ANTIBODY (ROUTINE TESTING W REFLEX): HIV Screen 4th Generation wRfx: NONREACTIVE

## 2016-02-02 LAB — CBC WITH DIFFERENTIAL/PLATELET
Basophils Absolute: 0 10*3/uL (ref 0.0–0.2)
Basos: 0 %
EOS (ABSOLUTE): 0.2 10*3/uL (ref 0.0–0.4)
Eos: 2 %
Hematocrit: 37.3 % (ref 34.0–46.6)
Hemoglobin: 12 g/dL (ref 11.1–15.9)
Immature Grans (Abs): 0 10*3/uL (ref 0.0–0.1)
Immature Granulocytes: 0 %
Lymphocytes Absolute: 2.2 10*3/uL (ref 0.7–3.1)
Lymphs: 24 %
MCH: 26.6 pg (ref 26.6–33.0)
MCHC: 32.2 g/dL (ref 31.5–35.7)
MCV: 83 fL (ref 79–97)
Monocytes Absolute: 0.9 10*3/uL (ref 0.1–0.9)
Monocytes: 10 %
Neutrophils Absolute: 5.7 10*3/uL (ref 1.4–7.0)
Neutrophils: 64 %
Platelets: 297 10*3/uL (ref 150–379)
RBC: 4.51 x10E6/uL (ref 3.77–5.28)
RDW: 16.9 % — ABNORMAL HIGH (ref 12.3–15.4)
WBC: 9 10*3/uL (ref 3.4–10.8)

## 2016-02-02 LAB — FERRITIN: Ferritin: 40 ng/mL (ref 15–150)

## 2016-02-02 LAB — ALT: ALT: 36 IU/L — ABNORMAL HIGH (ref 0–32)

## 2016-02-02 LAB — VITAMIN D 25 HYDROXY (VIT D DEFICIENCY, FRACTURES): Vit D, 25-Hydroxy: 20.8 ng/mL — ABNORMAL LOW (ref 30.0–100.0)

## 2016-03-28 LAB — HM PAP SMEAR: HM Pap smear: NEGATIVE

## 2016-05-02 ENCOUNTER — Ambulatory Visit: Payer: 59 | Admitting: Family Medicine

## 2016-06-02 ENCOUNTER — Ambulatory Visit: Payer: 59 | Admitting: Family Medicine

## 2016-08-15 ENCOUNTER — Ambulatory Visit
Admission: EM | Admit: 2016-08-15 | Discharge: 2016-08-15 | Disposition: A | Discharge status: Home / Self Care | Payer: 59 | Attending: Family Medicine | Admitting: Family Medicine

## 2016-08-15 DIAGNOSIS — L723 Sebaceous cyst: Secondary | ICD-10-CM | POA: Diagnosis not present

## 2016-08-15 MED ORDER — SULFAMETHOXAZOLE-TRIMETHOPRIM 800-160 MG PO TABS: 1.0000 | ORAL_TABLET | Freq: Two times a day (BID) | ORAL | 0 refills | Status: AC

## 2016-08-15 MED ORDER — TRAMADOL HCL 50 MG PO TABS: 50.0000 mg | ORAL_TABLET | Freq: Three times a day (TID) | ORAL | 0 refills | Status: DC | PRN

## 2016-08-15 NOTE — ED Triage Notes (Signed)
Patient presents with a water cyst last year, she had a CT Scan done last June and it was dx as a water cyst. Patient is here today because she says it has grown in size and it is bothering her.

## 2016-08-15 NOTE — Discharge Instructions (Signed)
Take medication as prescribed. Avoid rubbing the area.  Follow up with surgery this week as discussed. See above to call to schedule.   Follow up with your primary care physician this week as needed. Return to Urgent care for new or worsening concerns.

## 2016-08-15 NOTE — ED Provider Notes (Signed)
[facility] ____________________________________________  Time seen: Approximately 10:28 AM  I have reviewed the triage vital signs and the nursing notes.   HISTORY  Chief Complaint Cyst (under left breast)   HPI Connie Sims is a 35 y.o. female presents for evaluation of swollen area beneath left breast. Patient reports she had a CT scan performed last year and noted an incidental finding of a sebaceous cyst. Patient reports at that time the area was much smaller and she had known about it as she could feel it for several years. Patient reports over the last week the area has become more swollen. Patient reports it is also now tender and previously it was not.  Patient denies any fall or injury. Patient reports her underwire bra does rub the area causing some irritation. Denies fevers. Reports feels well otherwise. Denies any history of similar. Patient reports mild pain in this same area. Denies any other pain, surrounding pain or abdominal pain. Reports continues to eat and drink well. Denies chest pain, shortness of breath, abdominal pain, fevers, nausea, vomiting or diarrhea.   Patient's last menstrual period was 07/12/2016. Patient reports her menstruals are normally irregular. Reports history of PCOS and declines any chance of pregnancy.  Enid Derry, MD:PCP.    Past Medical History:  Diagnosis Date  . Allergic rhinitis   . Allergic rhinitis with postnasal drip   . Congenital osteopetrosis    previously managed by specialist at [facility], periodic hearing tests  . Epigastric pain   . Foot fracture, right    Jones fx 5th metatarsal  . Impaired fasting glucose   . Insomnia   . Low back pain   . LUQ pain   . Microcytosis   . Morbid obesity with BMI of 40.0-44.9, adult ([place])   . PCOS (polycystic ovarian syndrome)   . Trochanteric bursitis of right hip     Patient Active Problem List   Diagnosis Date Noted  . Screening for HIV (human immunodeficiency virus)  02/01/2016  . PLMD (periodic limb movement disorder) 02/01/2016  . Microcytosis 02/01/2016  . Vitamin D deficiency 02/01/2016  . Elevated alanine aminotransferase (ALT) level 02/01/2016  . Hypertriglyceridemia 10/23/2015  . Recurrent boils 10/23/2015  . 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  . PCOS (polycystic ovarian syndrome)   . Allergic rhinitis   . Obesity (BMI 30-39.9)   . Insomnia   . Low back pain   . Impaired fasting glucose     Past Surgical History:  Procedure Laterality Date  . NASAL SINUS SURGERY      No current facility-administered medications for this encounter.   Current Outpatient Prescriptions:  .  cholecalciferol (VITAMIN D) 400 UNITS TABS tablet, Take 400 Units by mouth., Disp: , Rfl:  .  Biotin 10 MG CAPS, Take by mouth., Disp: , Rfl:  .  budesonide (RHINOCORT AQUA) 32 MCG/ACT nasal spray, Place 1 spray into both nostrils daily., Disp: 1 Bottle, Rfl: 11 .  cyclobenzaprine (FLEXERIL) 5 MG tablet, Take 1 tablet (5 mg total) by mouth at bedtime. PRN muscle spasms, Disp: 30 tablet, Rfl: 0 .  Ferrous Sulfate (IRON) 325 (65 FE) MG TABS, Take by mouth daily as needed. , Disp: , Rfl:  .  loratadine (CLARITIN) 10 MG tablet, Take 1 tablet (10 mg total) by mouth daily., Disp: 30 tablet, Rfl: 11 .  metFORMIN (GLUCOPHAGE) 500 MG tablet, Take 1,000 mg by mouth at bedtime., Disp: , Rfl:  .  montelukast (SINGULAIR) 10 MG tablet, Take 10 mg by mouth daily., Disp: ,  Rfl: 11 .  norethindrone-ethinyl estradiol (MICROGESTIN,JUNEL,LOESTRIN) 1-20 MG-MCG tablet, Take 1 tablet by mouth daily., Disp: , Rfl:  .  sulfamethoxazole-trimethoprim (BACTRIM DS,SEPTRA DS) 800-160 MG tablet, Take 1 tablet by mouth 2 (two) times daily., Disp: 20 tablet, Rfl: 0 .  traMADol (ULTRAM) 50 MG tablet, Take 1 tablet (50 mg total) by mouth every 8 (eight) hours as needed (Do not drive or operate machinery while taking as can cause drowsiness.)., Disp: 10 tablet, Rfl: 0  Allergies Review of patient's allergies  indicates no known allergies.  Family History  Problem Relation Age of Onset  . Liver disease Mother     fatty liver, s/p liver biopsy  . Alcohol abuse Mother   . Hypertension Maternal Grandmother   . Glaucoma Maternal Grandmother   . Diabetes Maternal Grandfather   . Heart disease Maternal Grandfather     Social History Social History  Substance Use Topics  . Smoking status: Never Smoker  . Smokeless tobacco: Never Used  . Alcohol use No    Review of Systems Constitutional: No fever/chills Eyes: No visual changes. ENT: No sore throat. Cardiovascular: Denies chest pain. Respiratory: Denies shortness of breath. Gastrointestinal: No abdominal pain.  No nausea, no vomiting.  No diarrhea.  No constipation. Genitourinary: Negative for dysuria. Musculoskeletal: Negative for back pain. Skin: Negative for rash. as above.  Neurological: Negative for headaches, focal weakness or numbness.  10-point ROS otherwise negative.  ____________________________________________   PHYSICAL EXAM:  VITAL SIGNS: ED Triage Vitals  Enc Vitals Group     BP 08/15/16 0940 119/76     Pulse Rate 08/15/16 0940 73     Resp 08/15/16 0940 18     Temp 08/15/16 0940 98 F (36.7 C)     Temp Source 08/15/16 0940 Oral     SpO2 08/15/16 0940 100 %     Weight 08/15/16 0941 240 lb (108.9 kg)     Height 08/15/16 0941 5\' 5"  (1.651 m)     Head Circumference --      Peak Flow --      Pain Score 08/15/16 0943 8     Pain Loc --      Pain Edu? --      Excl. in [place]? --     Constitutional: Alert and oriented. Well appearing and in no acute distress. Eyes: Conjunctivae are normal. PERRL. EOMI. ENT      Head: Normocephalic and atraumatic.      Nose: No congestion/rhinnorhea.      Mouth/Throat: Mucous membranes are moist.Oropharynx non-erythematous. Neck: No stridor. Supple without meningismus.  Hematological/Lymphatic/Immunilogical: No cervical lymphadenopathy. Cardiovascular: Normal rate, regular  rhythm. Grossly normal heart sounds.  Good peripheral circulation. Respiratory: Normal respiratory effort without tachypnea nor retractions. Breath sounds are clear and equal bilaterally. No wheezes/rales/rhonchi.. Gastrointestinal: Soft and nontender. No distention. Musculoskeletal:  Ambulatory with steady gait. No midline cervical, thoracic or lumbar tenderness to palpation.  Neurologic:  Normal speech and language. No gross focal neurologic deficits are appreciated. Speech is normal. No gait instability.  Skin:  Skin is warm, dry and intact. No rash noted. Except: Left subxiphoid region round 3.5 x 3.5 mobile tender, mildly erythematous indurated area palpated, no fluctuance, mild tenderness to direct palpation, no surrounding tenderness, abdomen soft and nontender, chest nontender, no bony tenderness.  Psychiatric: Mood and affect are normal. Speech and behavior are normal. Patient exhibits appropriate insight and judgment   ___________________________________________   LABS (all labs ordered are listed, but only abnormal results are displayed)  Labs Reviewed - No data to display ____________________________________________   RADIOLOGY CLINICAL DATA:  Left-sided abdominal pain for years. No acute  injury, previous relevant surgery or history of malignancy. Initial  encounter.   EXAM:  CT ABDOMEN WITH CONTRAST   TECHNIQUE:  Multidetector CT imaging of the abdomen was performed using the  standard protocol following bolus administration of intravenous  contrast.   CONTRAST:  100 ml Omnipaque 350.   COMPARISON:  Abdominal ultrasound 08/19/2014.   FINDINGS:  Lower chest: Clear lung bases. No significant pleural or pericardial  effusion.   Hepatobiliary: The liver demonstrates decreased density consistent  with steatosis. No focal lesions or abnormal enhancement  demonstrated. No evidence of gallstones, gallbladder wall thickening  or biliary dilatation.   Pancreas:  Unremarkable. No pancreatic ductal dilatation or  surrounding inflammatory changes.   Spleen: Normal in size without focal abnormality.   Adrenals/Urinary Tract: Both adrenal glands appear normal.The  kidneys appear normal without evidence of urinary tract calculus or  hydronephrosis. Bladder not imaged.   Stomach/Bowel: No evidence of bowel wall thickening, distention or  surrounding inflammatory change.   Vascular/Lymphatic: There are no enlarged abdominal lymph nodes. No  significant vascular findings are present.   Other: Subcutaneous nodule on the left subxiphoid region measures 15  mm and 11 HU, probably a retention/sebaceous cyst.   Musculoskeletal: There is diffuse heterogeneous osteosclerosis  throughout the visualized spine, sacrum and iliac bones. No bone  destruction, lytic lesion or pathologic fracture identified.   IMPRESSION:  1. No acute abdominal findings demonstrated.  2. Hepatic steatosis.  3. Probable retention/ sebaceous cyst in the left subxiphoid region.  Correlate clinically.  4. Nonspecific, diffusely heterogeneous osteosclerosis. This  appearance could be secondary to hematologic causes such as sickle  cell anemia, metabolic bone disorder, osseous dysplasia and  metastatic disease. If the etiology for this is not known, further  clinical workup recommended.  5. These results will be called to the ordering clinician or  representative by the Radiologist Assistant, and communication  documented in the PACS or zVision Dashboard.    Electronically Signed    By: Richardean Sale M.D.    On: 02/11/2015 14:56   Obtained via epic ____________________________________________   PROCEDURES Procedures     INITIAL IMPRESSION / ASSESSMENT AND PLAN / ED COURSE  Pertinent labs & imaging results that were available during my care of the patient were reviewed by me and considered in my medical decision making (see chart for  details).  Well-appearing patient. No acute distress. Presents for evaluation of area beneath the left breast of swelling and tender area. Patient reports history of similar, but smaller, for several years. Patient reports CT previously picked up the area as sebaceous cyst. CT of the 02/11/2015 via epic reviewed-per radiologist probable retention/sebaceous cyst in the left subxiphoid region. As patient reports no previous change in the size, but reports recent increased size as well as mild erythema noted, discussed in detail with patient concerned for possible infection of sebaceous cyst. Also discussed in detail with patient as area is not superficial nor pointing, recommend follow-up with surgery for removal. Will place patient on oral Bactrim, when necessary over-the-counter Tylenol or ibuprofen when necessary tramadol is a for breakthrough pain. Work note for today given. Patient declines any chance of pregnancy.Discussed indication, risks and benefits of medications with patient. Information for surgery given, patient to call today to schedule.   ssed follow up with Primary care physician this week. Discussed follow up and return  parameters including no resolution or any worsening concerns. Patient verbalized understanding and agreed to plan.   ____________________________________________   FINAL CLINICAL IMPRESSION(S) / ED DIAGNOSES  Final diagnoses:  Sebaceous cyst     New Prescriptions   SULFAMETHOXAZOLE-TRIMETHOPRIM (BACTRIM DS,SEPTRA DS) 800-160 MG TABLET    Take 1 tablet by mouth 2 (two) times daily.   TRAMADOL (ULTRAM) 50 MG TABLET    Take 1 tablet (50 mg total) by mouth every 8 (eight) hours as needed (Do not drive or operate machinery while taking as can cause drowsiness.).    Note: This dictation was prepared with Dragon dictation along with smaller phrase technology. Any transcriptional errors that result from this process are unintentional.    Clinical Course       Marylene Land, NP 08/15/16 1048

## 2016-08-16 ENCOUNTER — Ambulatory Visit (INDEPENDENT_AMBULATORY_CARE_PROVIDER_SITE_OTHER): Payer: 59 | Admitting: General Surgery

## 2016-08-16 ENCOUNTER — Encounter: Payer: Self-pay | Admitting: General Surgery

## 2016-08-16 VITALS — BP 129/80 | HR 72 | Temp 98.0°F | Ht 65.0 in | Wt 242.0 lb

## 2016-08-16 DIAGNOSIS — L723 Sebaceous cyst: Secondary | ICD-10-CM | POA: Diagnosis not present

## 2016-08-16 MED ORDER — OXYCODONE-ACETAMINOPHEN 5-325 MG PO TABS: 1.0000 | ORAL_TABLET | ORAL | 0 refills | Status: DC | PRN

## 2016-08-16 NOTE — Progress Notes (Signed)
Patient ID: Connie Sims, female   DOB: Apr 05, 1981, 35 y.o.   MRN: [id number]  CC: Infected cyst  HPI Connie Sims is a 35 y.o. female who presents to clinic today for evaluation of an infected cyst below her left breast. Patient reports that she started no serious on Saturday and from that time gradually became more painful, red, warm. It has never drained or been infected like this before. She sought care in the urgent care on Monday and was started on antibiotics. Patient reports that a has not worsened since being started on antibiotics. She has been told she is prediabetic but otherwise has no significant comorbidities for this. She was also told that she had a sebaceous cyst of the area that was incidentally seen on a CT scan of the abdomen that was performed for something else. She currently denies any fevers, chills, nausea, vomiting, chest pain, shortness of breath, diarrhea, constipation. She primarily complains of pain that is worsened with activity as her breast rubs against the area with movement. She is been attempting to treat the pain with tramadol that was provided by the urgent care without significant relief.  HPI  Past Medical History:  Diagnosis Date  . Allergic rhinitis   . Allergic rhinitis with postnasal drip   . Congenital osteopetrosis    previously managed by specialist at [facility], periodic hearing tests  . Epigastric pain   . Foot fracture, right    Jones fx 5th metatarsal  . Impaired fasting glucose   . Insomnia   . Low back pain   . LUQ pain   . Microcytosis   . Morbid obesity with BMI of 40.0-44.9, adult ([place])   . PCOS (polycystic ovarian syndrome)   . Trochanteric bursitis of right hip     Past Surgical History:  Procedure Laterality Date  . NASAL SINUS SURGERY      Family History  Problem Relation Age of Onset  . Liver disease Mother     fatty liver, s/p liver biopsy  . Alcohol abuse Mother   . Hypertension Maternal Grandmother   . Glaucoma  Maternal Grandmother   . Diabetes Maternal Grandfather   . Heart disease Maternal Grandfather     Social History Social History  Substance Use Topics  . Smoking status: Never Smoker  . Smokeless tobacco: Never Used  . Alcohol use No    No Known Allergies  Current Outpatient Prescriptions  Medication Sig Dispense Refill  . Biotin 10 MG CAPS Take by mouth.    . budesonide (RHINOCORT AQUA) 32 MCG/ACT nasal spray Place 1 spray into both nostrils daily. 1 Bottle 11  . cholecalciferol (VITAMIN D) 400 UNITS TABS tablet Take 400 Units by mouth.    . cyclobenzaprine (FLEXERIL) 5 MG tablet Take 1 tablet (5 mg total) by mouth at bedtime. PRN muscle spasms 30 tablet 0  . Ferrous Sulfate (IRON) 325 (65 FE) MG TABS Take by mouth daily as needed.     . loratadine (CLARITIN) 10 MG tablet Take 1 tablet (10 mg total) by mouth daily. 30 tablet 11  . metFORMIN (GLUCOPHAGE) 500 MG tablet Take 1,000 mg by mouth at bedtime.    . montelukast (SINGULAIR) 10 MG tablet Take 10 mg by mouth daily.  11  . norethindrone-ethinyl estradiol (MICROGESTIN,JUNEL,LOESTRIN) 1-20 MG-MCG tablet Take 1 tablet by mouth daily.    Marland Kitchen sulfamethoxazole-trimethoprim (BACTRIM DS,SEPTRA DS) 800-160 MG tablet Take 1 tablet by mouth 2 (two) times daily. 20 tablet 0  . traMADol (  ULTRAM) 50 MG tablet Take 1 tablet (50 mg total) by mouth every 8 (eight) hours as needed (Do not drive or operate machinery while taking as can cause drowsiness.). 10 tablet 0  . oxyCODONE-acetaminophen (ROXICET) 5-325 MG tablet Take 1 tablet by mouth every 4 (four) hours as needed for severe pain. 30 tablet 0   No current facility-administered medications for this visit.      Review of Systems A Multi-point review of systems was asked and was negative except for the findings documented in the history of present illness  Physical Exam Blood pressure 129/80, pulse 72, temperature 98 F (36.7 C), temperature source Oral, height 5\' 5"  (1.651 m), weight 109.8  kg (242 lb), last menstrual period 07/12/2016. CONSTITUTIONAL: No acute distress. EYES: Pupils are equal, round, and reactive to light, Sclera are non-icteric. EARS, NOSE, MOUTH AND THROAT: The oropharynx is clear. The oral mucosa is pink and moist. Hearing is intact to voice. LYMPH NODES:  Lymph nodes in the neck are normal. RESPIRATORY:  Lungs are clear. There is normal respiratory effort, with equal breath sounds bilaterally, and without pathologic use of accessory muscles. CARDIOVASCULAR: Heart is regular without murmurs, gallops, or rubs. GI: The abdomen is soft, nontender, and nondistended. There are no palpable masses. There is no hepatosplenomegaly. There are normal bowel sounds in all quadrants. GU: Rectal deferred.   MUSCULOSKELETAL: Normal muscle strength and tone. No cyanosis or edema.   SKIN: Turgor is good. There is an obvious area that is erythematous, warm and tender area below the left breast that measures approximately 5 cm in greatest diameter. There is blanching erythema and induration but no obvious fluctuance. NEUROLOGIC: Motor and sensation is grossly normal. Cranial nerves are grossly intact. PSYCH:  Oriented to person, place and time. Affect is normal.  Data Reviewed There are no new labs her images associated with this problem. I have personally reviewed the patient's imaging, laboratory findings and medical records.    Assessment    Infected sebaceous cyst    Plan    35 year old female with an infected sebaceous cyst was started on oral antibiotics yesterday. Given my exam findings discussed the option of performing an incision and drainage today in the clinic. Discussed that I would not be able to remove the entire cyst due to its infected state. Patient voiced understanding. She voiced that she would like to try to give the antibiotics that she started yesterday afternoon more time. Counseled her as to appropriate use of warm compresses and we'll provide her with  a short course of stronger pain medications. Discussed that should it worsen that she is to return to clinic immediately. Also stated that should not be any better by Thursday that she should then return to clinic for incision and drainage. She voiced understanding and desires to wait on drainage and will either follow up on Thursday for incision and drainage or in several weeks for a definitive excision.     Time spent with the patient was 30 minutes, with more than 50% of the time spent in face-to-face education, counseling and care coordination.     Clayburn Pert, MD FACS General Surgeon 08/16/2016, 11:10 AM

## 2016-08-16 NOTE — Patient Instructions (Signed)
Please see your follow up appointment listed below. Please call our office if you have questions or concerns. 

## 2016-08-17 ENCOUNTER — Encounter: Payer: Self-pay | Admitting: Family Medicine

## 2016-08-17 ENCOUNTER — Ambulatory Visit (INDEPENDENT_AMBULATORY_CARE_PROVIDER_SITE_OTHER): Payer: 59 | Admitting: Family Medicine

## 2016-08-17 VITALS — BP 112/72 | HR 100 | Temp 98.0°F | Resp 14 | Wt 239.0 lb

## 2016-08-17 DIAGNOSIS — E282 Polycystic ovarian syndrome: Secondary | ICD-10-CM

## 2016-08-17 DIAGNOSIS — M545 Low back pain, unspecified: Secondary | ICD-10-CM

## 2016-08-17 DIAGNOSIS — E559 Vitamin D deficiency, unspecified: Secondary | ICD-10-CM | POA: Diagnosis not present

## 2016-08-17 DIAGNOSIS — E669 Obesity, unspecified: Secondary | ICD-10-CM

## 2016-08-17 DIAGNOSIS — R740 Nonspecific elevation of levels of transaminase and lactic acid dehydrogenase [LDH]: Secondary | ICD-10-CM | POA: Diagnosis not present

## 2016-08-17 DIAGNOSIS — J301 Allergic rhinitis due to pollen: Secondary | ICD-10-CM

## 2016-08-17 DIAGNOSIS — R7301 Impaired fasting glucose: Secondary | ICD-10-CM

## 2016-08-17 DIAGNOSIS — L0291 Cutaneous abscess, unspecified: Secondary | ICD-10-CM

## 2016-08-17 DIAGNOSIS — E781 Pure hyperglyceridemia: Secondary | ICD-10-CM

## 2016-08-17 DIAGNOSIS — R7401 Elevation of levels of liver transaminase levels: Secondary | ICD-10-CM

## 2016-08-17 DIAGNOSIS — Q782 Osteopetrosis: Secondary | ICD-10-CM

## 2016-08-17 DIAGNOSIS — L0390 Cellulitis, unspecified: Secondary | ICD-10-CM

## 2016-08-17 MED ORDER — METFORMIN HCL ER 500 MG PO TB24: 500.0000 mg | ORAL_TABLET | Freq: Every day | ORAL | 5 refills | Status: DC

## 2016-08-17 MED ORDER — CYCLOBENZAPRINE HCL 5 MG PO TABS: 5.0000 mg | ORAL_TABLET | Freq: Every day | ORAL | 6 refills | Status: DC

## 2016-08-17 NOTE — Patient Instructions (Addendum)
Please do eat yogurt daily or take a probiotic daily for the next month or two We want to replace the healthy germs in the gut If you notice foul, watery diarrhea in the next two months, schedule an appointment RIGHT AWAY  Take 2,000 iu of vitamin D3 once a day for the next 4-6 months, then 1,000 iu daily  Start the new longer-acting metformin for PCOS  Return in 3 months for fasting labs to check average blood sugar and cholesterol  Try to limit saturated fats in your diet (bologna, hot dogs, barbeque, cheeseburgers, hamburgers, steak, bacon, sausage, cheese, etc.) and get more fresh fruits, vegetables, and whole grains

## 2016-08-17 NOTE — Progress Notes (Signed)
BP 112/72   Pulse 100   Temp 98 F (36.7 C) (Oral)   Resp 14   Wt 239 lb (108.4 kg)   LMP 07/12/2016   SpO2 97%   BMI 39.77 kg/m    Subjective:    Patient ID: Connie Sims, female    DOB: 05-29-1981, 35 y.o.   MRN: [id number]  HPI: Connie Sims is a 35 y.o. female  Chief Complaint  Patient presents with  . Medication Refill    Infected cyst on the upper left side abdomen; low grade elevation in temperature; not sure about sweats at night, not waking up wet; taking antibiotics; will start probiotics after finishing   Allergic rhinitis; using rhinocort occasionally; doesn't use it if not needed, year round, but not really bad  Not taking metformin any more because GI upset; no problems with hirsutism  She has had chronic lower back pain; always tender, has osteopetrosis; pain going on for 15 years; muscle relaxer helps; she is not interested in ortho or rheum or PT referrals  She does not take iron regularly  Low vitamin D; not taking right now; she'll start back  Depression screen [facility] 2/9 08/17/2016  Decreased Interest 0  Down, Depressed, Hopeless 0  PHQ - 2 Score 0   Relevant past medical, surgical, family and social history reviewed Past Medical History:  Diagnosis Date  . Allergic rhinitis   . Allergic rhinitis with postnasal drip   . Congenital osteopetrosis    previously managed by specialist at [facility], periodic hearing tests  . Epigastric pain   . Foot fracture, right    Jones fx 5th metatarsal  . Impaired fasting glucose   . Insomnia   . Low back pain   . LUQ pain   . Microcytosis   . Morbid obesity with BMI of 40.0-44.9, adult ([place])   . PCOS (polycystic ovarian syndrome)   . Trochanteric bursitis of right hip    Past Surgical History:  Procedure Laterality Date  . NASAL SINUS SURGERY     Family History  Problem Relation Age of Onset  . Liver disease Mother     fatty liver, s/p liver biopsy  . Alcohol abuse Mother   . Hypertension  Maternal Grandmother   . Glaucoma Maternal Grandmother   . Diabetes Maternal Grandfather   . Heart disease Maternal Grandfather    Social History  Substance Use Topics  . Smoking status: Never Smoker  . Smokeless tobacco: Never Used  . Alcohol use No   Interim medical history since last visit reviewed. Allergies and medications reviewed  Review of Systems Per HPI unless specifically indicated above     Objective:    BP 112/72   Pulse 100   Temp 98 F (36.7 C) (Oral)   Resp 14   Wt 239 lb (108.4 kg)   LMP 07/12/2016   SpO2 97%   BMI 39.77 kg/m   Wt Readings from Last 3 Encounters:  08/18/16 242 lb (109.8 kg)  08/17/16 239 lb (108.4 kg)  08/16/16 242 lb (109.8 kg)    Physical Exam  Constitutional: She appears well-developed and well-nourished. No distress.  Obese female  HENT:  Head: Normocephalic and atraumatic.  Eyes: EOM are normal. No scleral icterus.  Neck: No thyromegaly present.  Cardiovascular: Normal rate, regular rhythm and normal heart sounds.   No murmur heard. Pulmonary/Chest: Effort normal and breath sounds normal. No respiratory distress. She has no wheezes.  Abdominal: Soft. Bowel sounds are normal. She  exhibits no distension.  Musculoskeletal: Normal range of motion. She exhibits no edema.  Neurological: She is alert. She exhibits normal muscle tone.  Skin: Skin is warm and dry. Lesion (upper abdomen left of midline, large (maybe 5 cm across) indurated erythematous area; no focal pore or purulent collection; does not appear amenabale to I&D at this time; tender to palpation) noted. She is not diaphoretic. No pallor.  No hirsutism  Psychiatric: She has a normal mood and affect. Her behavior is normal. Judgment and thought content normal. Her mood appears not anxious. She does not exhibit a depressed mood.   Results for orders placed or performed during the hospital encounter of 08/15/16  Aerobic Culture (superficial specimen)  Result Value Ref  Range   Specimen Description ABDOMEN    Special Requests NONE left abdomen    Gram Stain      ABUNDANT WBC PRESENT,BOTH PMN AND MONONUCLEAR RARE GRAM POSITIVE COCCI IN PAIRS    Culture      NO GROWTH 2 DAYS Performed at [facility]    Report Status 08/21/2016 FINAL       Assessment & Plan:   Problem List Items Addressed This Visit      Respiratory   Allergic rhinitis    Sx are mild; may use nasal corticosteroid        Endocrine   PCOS (polycystic ovarian syndrome)    Will use metformin, long-acting version to see if better tolerated      Impaired fasting glucose    Will use metformin to see if that helps with weight loss; check A1c and glucose every 6-12 months; she'll return in about 3 months after making some dietary changes      Relevant Orders   Hemoglobin A1c     Musculoskeletal and Integument   Osteopetrosis    And chronic back pain; chronic condition, unchanged        Other   Vitamin D deficiency    Start back on vit D supplementation      Obesity (BMI 30-39.9)    Use long-acting metformin to see if this will help with modest weight loss      Relevant Medications   metFORMIN (GLUCOPHAGE XR) 500 MG 24 hr tablet   Low back pain - Primary    Chronic condition; okay to use PRN muscle relaxants      Relevant Medications   cyclobenzaprine (FLEXERIL) 5 MG tablet   Hypertriglyceridemia    Return for fasting labs on another day in about 3 months, after she has a chance to try some dietary changes      Relevant Orders   Lipid panel   Elevated alanine aminotransferase (ALT) level    Check liver transaminase with other fasting labs      Relevant Orders   COMPLETE METABOLIC PANEL WITH GFR    Other Visit Diagnoses    Cellulitis and abscess       on antibiotics per surgeon; she will return to that office later this week for expected I&D; cautioned about C diff, use yogurt or probiotics       Follow up plan: Return in about 3 months  (around 11/17/2016) for visit with fasting labs.  An after-visit summary was printed and given to the patient at [place].  Please see the patient instructions which may contain other information and recommendations beyond what is mentioned above in the assessment and plan.  Meds ordered this encounter  Medications  . metFORMIN (GLUCOPHAGE XR) 500 MG  24 hr tablet    Sig: Take 1 tablet (500 mg total) by mouth daily with breakfast.    Dispense:  30 tablet    Refill:  5    Switching from short-acting to long-acting  . cyclobenzaprine (FLEXERIL) 5 MG tablet    Sig: Take 1 tablet (5 mg total) by mouth at bedtime. PRN muscle spasms    Dispense:  30 tablet    Refill:  6    Orders Placed This Encounter  Procedures  . COMPLETE METABOLIC PANEL WITH GFR  . Hemoglobin A1c  . Lipid panel

## 2016-08-18 ENCOUNTER — Ambulatory Visit: Payer: Self-pay | Admitting: General Surgery

## 2016-08-18 ENCOUNTER — Ambulatory Visit (INDEPENDENT_AMBULATORY_CARE_PROVIDER_SITE_OTHER): Payer: 59 | Admitting: General Surgery

## 2016-08-18 ENCOUNTER — Encounter: Payer: Self-pay | Admitting: General Surgery

## 2016-08-18 VITALS — BP 120/69 | HR 69 | Temp 98.2°F | Ht 65.0 in | Wt 242.0 lb

## 2016-08-18 DIAGNOSIS — L723 Sebaceous cyst: Secondary | ICD-10-CM | POA: Diagnosis not present

## 2016-08-18 MED ORDER — SULFAMETHOXAZOLE-TRIMETHOPRIM 800-160 MG PO TABS: 1.0000 | ORAL_TABLET | Freq: Two times a day (BID) | ORAL | 0 refills | Status: DC

## 2016-08-18 NOTE — Progress Notes (Signed)
Outpatient Surgical Follow Up  08/18/2016  Connie Sims is an 35 y.o. female.   Chief Complaint  Patient presents with  . Follow-up    Sebaceous Cyst-seen in urgent care/08/15/16    HPI: 35 year old female returns to clinic today stating that her infected sebaceous cyst has not improved since she was last seen. She denies any current fevers, chills, nausea, vomiting, chest pain, short of breath, diarrhea, constipation. Her primary complaint is of pain to the site and inability to touch. She strongly desires to have the area lanced now.  Past Medical History:  Diagnosis Date  . Allergic rhinitis   . Allergic rhinitis with postnasal drip   . Congenital osteopetrosis    previously managed by specialist at [facility], periodic hearing tests  . Epigastric pain   . Foot fracture, right    Jones fx 5th metatarsal  . Impaired fasting glucose   . Insomnia   . Low back pain   . LUQ pain   . Microcytosis   . Morbid obesity with BMI of 40.0-44.9, adult ([place])   . PCOS (polycystic ovarian syndrome)   . Trochanteric bursitis of right hip     Past Surgical History:  Procedure Laterality Date  . NASAL SINUS SURGERY      Family History  Problem Relation Age of Onset  . Liver disease Mother     fatty liver, s/p liver biopsy  . Alcohol abuse Mother   . Hypertension Maternal Grandmother   . Glaucoma Maternal Grandmother   . Diabetes Maternal Grandfather   . Heart disease Maternal Grandfather     Social History:  reports that she has never smoked. She has never used smokeless tobacco. She reports that she does not drink alcohol or use drugs.  Allergies: No Known Allergies  Medications reviewed.    ROS A multipoint review of systems was completed. All pertinent positives and negatives were documented in the history of present illness remainder negative.   BP 120/69 (BP Location: Right Arm, Patient Position: Sitting, Cuff Size: Normal)   Pulse 69   Temp 98.2 F (36.8 C) (Oral)    Ht 5\' 5"  (1.651 m)   Wt 109.8 kg (242 lb)   LMP 07/12/2016   BMI 40.27 kg/m   Physical Exam Gen.: No acute distress Neck: Supple and nontender Chest: Clear to auscultation Heart: Regular rate and rhythm Abdomen: Soft, nondistended Skin: 6 x 5 cm area of induration that is tender to palpation at the costal margin in the midclavicular line on the left.    No results found for this or any previous visit (from the past 48 hour(s)). No results found.  Assessment/Plan:  1. Sebaceous cyst 35 year old female with an infected sebaceous cyst. It is not improved. She is here desiring to have the area incised and drained.  Procedure note: After obtaining of informed consent the area was sterilized with Betadine and sterilely draped. A time out was performed which identified the patient and procedure. We then proceeded to localize the area with 1% lidocaine with epinephrine. Once anesthesia was confirmed and the skin was incised with a 15 blade scalpel and copious amount of infected material was then immediately returned. Culture was taken of this. The incision was made cruciate and probed and irrigated until all infected material was thought to be returned. The area was re-localized the aforementioned local anesthetic. He was then packed with quarter inch iodoform packing and a sterile dressing was placed over this.  Patient will return to clinic for  follow-up in one week. She'll be called at the culture results yielded a microbe not covered by her current antibiotics. Her antibiotic therapy will be extended 1 week for total for two-week course of Bactrim.     Clayburn Pert, MD FACS General Surgeon  08/18/2016,3:32 PM

## 2016-08-18 NOTE — Addendum Note (Signed)
Addended by: Celene Kras on: 08/18/2016 04:09 PM   Modules accepted: Orders

## 2016-08-18 NOTE — Patient Instructions (Signed)
We have made an incision into the sebaceous cyst to allow it to drain. You will need to pack this daily and cover with a bandage. Please see your follow up appointment listed below. Please call our office if you have any questions or concerns.

## 2016-08-21 LAB — AEROBIC CULTURE W GRAM STAIN (SUPERFICIAL SPECIMEN): Culture: NO GROWTH

## 2016-08-21 LAB — AEROBIC CULTURE  (SUPERFICIAL SPECIMEN)

## 2016-08-21 NOTE — Assessment & Plan Note (Signed)
Use long-acting metformin to see if this will help with modest weight loss

## 2016-08-21 NOTE — Assessment & Plan Note (Signed)
And chronic back pain; chronic condition, unchanged

## 2016-08-21 NOTE — Assessment & Plan Note (Addendum)
Return for fasting labs on another day in about 3 months, after she has a chance to try some dietary changes

## 2016-08-21 NOTE — Assessment & Plan Note (Signed)
Start back on vit D supplementation

## 2016-08-21 NOTE — Assessment & Plan Note (Signed)
Sx are mild; may use nasal corticosteroid

## 2016-08-21 NOTE — Assessment & Plan Note (Signed)
Will use metformin, long-acting version to see if better tolerated

## 2016-08-21 NOTE — Assessment & Plan Note (Signed)
Chronic condition; okay to use PRN muscle relaxants

## 2016-08-21 NOTE — Assessment & Plan Note (Signed)
Check liver transaminase with other fasting labs

## 2016-08-21 NOTE — Assessment & Plan Note (Addendum)
Will use metformin to see if that helps with weight loss; check A1c and glucose every 6-12 months; she'll return in about 3 months after making some dietary changes

## 2016-08-22 NOTE — Telephone Encounter (Signed)
Error

## 2016-08-23 ENCOUNTER — Telehealth: Payer: Self-pay

## 2016-08-23 NOTE — Telephone Encounter (Signed)
Patient returned your call regarding results °

## 2016-08-23 NOTE — Telephone Encounter (Signed)
Spoke with Connie Sims at this time regarding culture results. She was instructed to continue taking current antibiotic and keep her follow up appointment this week with Dr.Pabon.

## 2016-08-23 NOTE — Telephone Encounter (Signed)
LVM for patient to call office regarding Culture results.

## 2016-08-25 ENCOUNTER — Encounter: Payer: Self-pay | Admitting: Surgery

## 2016-08-25 ENCOUNTER — Ambulatory Visit (INDEPENDENT_AMBULATORY_CARE_PROVIDER_SITE_OTHER): Payer: 59 | Admitting: Surgery

## 2016-08-25 VITALS — BP 120/77 | HR 64 | Temp 97.9°F | Ht 65.0 in | Wt 245.0 lb

## 2016-08-25 DIAGNOSIS — Z09 Encounter for follow-up examination after completed treatment for conditions other than malignant neoplasm: Secondary | ICD-10-CM

## 2016-08-25 NOTE — Progress Notes (Signed)
S/p I/D infected EIC 8/24 by Dr. Adonis Huguenin Doing well, no issues  PE NAD, open wound w good granulation tissue, no infectin  A/p doing very well after I/d Continue wound care No complications F/U prn

## 2016-08-25 NOTE — Patient Instructions (Signed)
Please continue to change your dressing/bandage twice a day.  You are able to shower and let soap and water run on your wound.

## 2016-11-16 ENCOUNTER — Ambulatory Visit: Payer: 59 | Admitting: Family Medicine

## 2016-11-16 ENCOUNTER — Encounter: Payer: Self-pay | Admitting: Family Medicine

## 2017-01-25 ENCOUNTER — Encounter: Payer: Self-pay | Admitting: *Deleted

## 2017-01-25 ENCOUNTER — Ambulatory Visit
Admission: EM | Admit: 2017-01-25 | Discharge: 2017-01-25 | Disposition: A | Discharge status: Home / Self Care | Payer: 59 | Attending: Family Medicine | Admitting: Family Medicine

## 2017-01-25 DIAGNOSIS — R0981 Nasal congestion: Secondary | ICD-10-CM

## 2017-01-25 DIAGNOSIS — H6591 Unspecified nonsuppurative otitis media, right ear: Secondary | ICD-10-CM

## 2017-01-25 HISTORY — DX: Type 2 diabetes mellitus without complications: E11.9

## 2017-01-25 MED ORDER — AMOXICILLIN 875 MG PO TABS: 875.0000 mg | ORAL_TABLET | Freq: Two times a day (BID) | ORAL | 0 refills | Status: DC

## 2017-01-25 NOTE — ED Provider Notes (Signed)
[facility] ____________________________________________  Time seen: Approximately 12:24 PM  I have reviewed the triage vital signs and the nursing notes.   HISTORY  Chief Complaint Otalgia; Nasal Congestion; and Facial Pain  HPI Connie Sims is a 36 y.o. female present for the complaints of 2-3 days of nasal congestion, and reports slightly worse nasal congestion comparatively to her baseline allergies. Patient reports some postnasal drainage and occasional cough. Denies fevers, chills or body aches. Patient reports over the last 2 days she has also had accompanying right ear discomfort prompting her to be evaluated today. Patient reports she has not been taking over the counter medications for the same complaints. Patient states right ear has mild discomfort and seems clogged with muffled hearing. Denies any loss of hearing, ear drainage, tinnitus, trauma or injury. Reports left ear feels fine. Patient reports continues to eat and drink well.  Denies chest pain, shortness of breath, abdominal pain, dysuria, extremity pain, extremity swelling or rash. Denies recent sickness. Denies recent antibiotic use.   Patient's last menstrual period was 11/09/2016 (approximate). Patient reports history of PCO S with irregular periods. Denies pregnancy. Enid Derry, MD: PCP    Past Medical History:  Diagnosis Date  . Allergic rhinitis   . Allergic rhinitis with postnasal drip   . Congenital osteopetrosis    previously managed by specialist at [facility], periodic hearing tests  . Diabetes mellitus without complication ([place])   . Epigastric pain   . Foot fracture, right    Jones fx 5th metatarsal  . Impaired fasting glucose   . Insomnia   . Low back pain   . LUQ pain   . Microcytosis   . Morbid obesity with BMI of 40.0-44.9, adult ([place])   . PCOS (polycystic ovarian syndrome)   . Trochanteric bursitis of right hip     Patient Active Problem List   Diagnosis Date Noted  .  Sebaceous cyst 08/16/2016  . Screening for HIV (human immunodeficiency virus) 02/01/2016  . PLMD (periodic limb movement disorder) 02/01/2016  . Vitamin D deficiency 02/01/2016  . Elevated alanine aminotransferase (ALT) level 02/01/2016  . Hypertriglyceridemia 10/23/2015  . Recurrent boils 10/23/2015  . 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  . PCOS (polycystic ovarian syndrome)   . Allergic rhinitis   . Obesity (BMI 30-39.9)   . Insomnia   . Low back pain   . Impaired fasting glucose     Past Surgical History:  Procedure Laterality Date  . NASAL SINUS SURGERY       No current facility-administered medications for this encounter.   Current Outpatient Prescriptions:  .  ibuprofen (ADVIL,MOTRIN) 200 MG tablet, Take 200 mg by mouth every 6 (six) hours as needed., Disp: , Rfl:  .  amoxicillin (AMOXIL) 875 MG tablet, Take 1 tablet (875 mg total) by mouth 2 (two) times daily., Disp: 20 tablet, Rfl: 0 .  Biotin 10 MG CAPS, Take by mouth., Disp: , Rfl:  .  budesonide (RHINOCORT AQUA) 32 MCG/ACT nasal spray, Place 1 spray into both nostrils daily., Disp: 1 Bottle, Rfl: 11 .  cyclobenzaprine (FLEXERIL) 5 MG tablet, Take 1 tablet (5 mg total) by mouth at bedtime. PRN muscle spasms, Disp: 30 tablet, Rfl: 6 .  Ferrous Sulfate (IRON) 325 (65 FE) MG TABS, Take by mouth daily as needed. , Disp: , Rfl:  .  metFORMIN (GLUCOPHAGE XR) 500 MG 24 hr tablet, Take 1 tablet (500 mg total) by mouth daily with breakfast., Disp: 30 tablet, Rfl: 5 .  sulfamethoxazole-trimethoprim (BACTRIM DS,SEPTRA  DS) 800-160 MG tablet, Take 1 tablet by mouth 2 (two) times daily., Disp: 14 tablet, Rfl: 0  Allergies Patient has no known allergies.  Family History  Problem Relation Age of Onset  . Liver disease Mother     fatty liver, s/p liver biopsy  . Alcohol abuse Mother   . Hypertension Maternal Grandmother   . Glaucoma Maternal Grandmother   . Diabetes Maternal Grandfather   . Heart disease Maternal Grandfather     Social  History Social History  Substance Use Topics  . Smoking status: Never Smoker  . Smokeless tobacco: Never Used  . Alcohol use No    Review of Systems Constitutional: No fever/chills Eyes: No visual changes. ENT: No sore throat. Cardiovascular: Denies chest pain. Respiratory: Denies shortness of breath. Gastrointestinal: No abdominal pain.  No nausea, no vomiting.  No diarrhea.  No constipation. Genitourinary: Negative for dysuria. Musculoskeletal: Negative for back pain. Skin: Negative for rash. Neurological: Negative for headaches, focal weakness or numbness.  10-point ROS otherwise negative.  ____________________________________________   PHYSICAL EXAM:  VITAL SIGNS: ED Triage Vitals  Enc Vitals Group     BP 01/25/17 1140 119/80     Pulse Rate 01/25/17 1140 77     Resp 01/25/17 1140 16     Temp 01/25/17 1140 97.4 F (36.3 C)     Temp Source 01/25/17 1140 Oral     SpO2 01/25/17 1140 99 %     Weight 01/25/17 1142 250 lb (113.4 kg)     Height 01/25/17 1142 5\' 6"  (1.676 m)     Head Circumference --      Peak Flow --      Pain Score --      Pain Loc --      Pain Edu? --      Excl. in [place]? --    Constitutional: Alert and oriented. Well appearing and in no acute distress. Eyes: Conjunctivae are normal. PERRL. EOMI. Head: Atraumatic. No sinus tenderness to palpation. No swelling. No erythema.  Ears:Left: Nontender, no erythema, mild effusion present, TM otherwise normal. Right: Nontender, moderate erythema with effusion present, otherwise TM appears normal. No surrounding erythema, swelling or tenderness bilaterally.  Nose:Nasal congestion with clear rhinorrhea  Mouth/Throat: Mucous membranes are moist. No pharyngeal erythema. No tonsillar swelling or exudate.  Neck: No stridor.  No cervical spine tenderness to palpation. Hematological/Lymphatic/Immunilogical: No cervical lymphadenopathy. Cardiovascular: Normal rate, regular rhythm. Grossly normal heart sounds.  Good  peripheral circulation. Respiratory: Normal respiratory effort.  No retractions. No wheezes, rales or rhonchi. Good air movement.  Gastrointestinal: Soft and nontender.  Musculoskeletal: Ambulatory with steady gait. No cervical, thoracic or lumbar tenderness to palpation. Neurologic:  Normal speech and language. No gait instability. Skin:  Skin appears warm, dry and intact. No rash noted. Psychiatric: Mood and affect are normal. Speech and behavior are normal. ___________________________________________   LABS (all labs ordered are listed, but only abnormal results are displayed)  Labs Reviewed - No data to display  PROCEDURES Procedures    INITIAL IMPRESSION / ASSESSMENT AND PLAN / ED COURSE  Pertinent labs & imaging results that were available during my care of the patient were reviewed by me and considered in my medical decision making (see chart for details).  well-appearing patient. No acute distress. Patient with right otitis medial with effusion and nasal congestion. Will treat with oral amoxicillin and supportive care. Encouraged rest, fluids, over-the-counter Sudafed or antihistamine use.Discussed indication, risks and benefits of medications with patient.  Discussed follow  up with Primary care physician this week. Discussed follow up and return parameters including no resolution or any worsening concerns. Patient verbalized understanding and agreed to plan.   ____________________________________________   FINAL CLINICAL IMPRESSION(S) / ED DIAGNOSES  Final diagnoses:  Right otitis media with effusion  Nasal congestion     Discharge Medication List as of 01/25/2017 12:36 PM    START taking these medications   Details  amoxicillin (AMOXIL) 875 MG tablet Take 1 tablet (875 mg total) by mouth 2 (two) times daily., Starting Wed 01/25/2017, Normal        Note: This dictation was prepared with Dragon dictation along with smaller phrase technology. Any transcriptional  errors that result from this process are unintentional.         Marylene Land, NP 01/25/17 1428

## 2017-01-25 NOTE — ED Triage Notes (Signed)
Right side facial pain, runny nose, right ear pain and fullness, since Monday.

## 2017-01-25 NOTE — Discharge Instructions (Signed)
Take medication as prescribed. Rest. Drink plenty of fluids.  ° °Follow up with your primary care physician this week as needed. Return to Urgent care for new or worsening concerns.  ° °

## 2017-06-08 ENCOUNTER — Telehealth: Payer: Self-pay

## 2017-06-08 ENCOUNTER — Ambulatory Visit (INDEPENDENT_AMBULATORY_CARE_PROVIDER_SITE_OTHER): Payer: 59 | Admitting: Family Medicine

## 2017-06-08 ENCOUNTER — Encounter: Payer: Self-pay | Admitting: Family Medicine

## 2017-06-08 VITALS — BP 110/74 | HR 91 | Temp 97.8°F | Resp 16 | Wt 249.4 lb

## 2017-06-08 DIAGNOSIS — R197 Diarrhea, unspecified: Secondary | ICD-10-CM

## 2017-06-08 DIAGNOSIS — E282 Polycystic ovarian syndrome: Secondary | ICD-10-CM | POA: Diagnosis not present

## 2017-06-08 DIAGNOSIS — N926 Irregular menstruation, unspecified: Secondary | ICD-10-CM

## 2017-06-08 LAB — CBC WITH DIFFERENTIAL/PLATELET
Basophils Absolute: 62 cells/uL (ref 0–200)
Basophils Relative: 1 %
Eosinophils Absolute: 124 cells/uL (ref 15–500)
Eosinophils Relative: 2 %
HCT: 35.9 % (ref 35.0–45.0)
Hemoglobin: 11.4 g/dL — ABNORMAL LOW (ref 11.7–15.5)
Lymphocytes Relative: 30 %
Lymphs Abs: 1860 cells/uL (ref 850–3900)
MCH: 25.1 pg — ABNORMAL LOW (ref 27.0–33.0)
MCHC: 31.8 g/dL — ABNORMAL LOW (ref 32.0–36.0)
MCV: 79.1 fL — ABNORMAL LOW (ref 80.0–100.0)
MPV: 8.9 fL (ref 7.5–12.5)
Monocytes Absolute: 558 cells/uL (ref 200–950)
Monocytes Relative: 9 %
Neutro Abs: 3596 cells/uL (ref 1500–7800)
Neutrophils Relative %: 58 %
Platelets: 285 10*3/uL (ref 140–400)
RBC: 4.54 MIL/uL (ref 3.80–5.10)
RDW: 17.6 % — ABNORMAL HIGH (ref 11.0–15.0)
WBC: 6.2 10*3/uL (ref 3.8–10.8)

## 2017-06-08 LAB — COMPLETE METABOLIC PANEL WITH GFR
ALT: 49 U/L — ABNORMAL HIGH (ref 6–29)
AST: 28 U/L (ref 10–30)
Albumin: 4 g/dL (ref 3.6–5.1)
Alkaline Phosphatase: 112 U/L (ref 33–115)
BUN: 16 mg/dL (ref 7–25)
CO2: 24 mmol/L (ref 20–31)
Calcium: 9 mg/dL (ref 8.6–10.2)
Chloride: 104 mmol/L (ref 98–110)
Creat: 0.75 mg/dL (ref 0.50–1.10)
GFR, Est African American: >89 mL/min (ref >=60)
GFR, Est Non African American: >89 mL/min (ref >=60)
Glucose, Bld: 94 mg/dL (ref 65–99)
Potassium: 4.2 mmol/L (ref 3.5–5.3)
Sodium: 138 mmol/L (ref 135–146)
Total Bilirubin: 0.4 mg/dL (ref 0.2–1.2)
Total Protein: 6.6 g/dL (ref 6.1–8.1)

## 2017-06-08 MED ORDER — METFORMIN HCL ER 500 MG PO TB24: 1000.0000 mg | ORAL_TABLET | Freq: Every day | ORAL | 5 refills | Status: DC

## 2017-06-08 MED ORDER — NORETHINDRONE ACET-ETHINYL EST 1-20 MG-MCG PO TABS: 1.0000 | ORAL_TABLET | Freq: Every day | ORAL | 1 refills | Status: DC

## 2017-06-08 NOTE — Assessment & Plan Note (Signed)
Encouraged pt to lose weight, start back on metformin, then increase dose from 500 mg daily to 1000 mg daily after one week

## 2017-06-08 NOTE — Progress Notes (Signed)
BP 110/74   Pulse 91   Temp 97.8 F (36.6 C) (Oral)   Resp 16   Wt 249 lb 6.4 oz (113.1 kg)   LMP 05/15/2017   SpO2 96%   BMI 40.25 kg/m    Subjective:    Patient ID: Connie Sims, female    DOB: [birth year], 36 y.o.   MRN: [id number]  HPI: Connie Sims is a 36 y.o. female  Chief Complaint  Patient presents with  . Diarrhea    Comes and go's, no gas or abdominal pain. POSS from antibiotic treatmemt   . Dysmenorrhea    Discuss irregular periods     HPI Patient is here for an acute visit She has had diarrhea 6 on the scale Going on for months She had been on antibiotic No really unusual odor No travel No new pets No turtle exposure Well water; dog at home not sick No fever No blood or mucous Varies in color, sometimes dark or light [place] makes it totally worse, beef and broccoli and rice; bread is totally fine No oysters, seafood Imodium helps for a few days, comes right back No abdominal pain; gets some sudden urge but no accidents stooling 2-3 x day; no cramps or palpitations Not even taking metformin; just quit it, no sx  She has irregular periods Has PCOS Last period 2 weeks duration; not heavy; can get by with just a tampon Thought it stopped and just older blood, then bright red again Ran out of birth control; last pap 2014 per staff; she is not sure that's true; [facility] (staff left message with them asking to send [place] copy of last pap with date and path) [facility]  Depression screen [facility] 2/9 06/08/2017 08/17/2016  Decreased Interest 0 0  Down, Depressed, Hopeless 0 0  PHQ - 2 Score 0 0   Relevant past medical, surgical, family and social history reviewed Past Medical History:  Diagnosis Date  . Allergic rhinitis   . Allergic rhinitis with postnasal drip   . Congenital osteopetrosis    previously managed by specialist at [facility], periodic hearing tests  . Diabetes mellitus without complication ([place])   . Epigastric pain   . Foot fracture,  right    Jones fx 5th metatarsal  . Impaired fasting glucose   . Insomnia   . Low back pain   . LUQ pain   . Microcytosis   . Morbid obesity with BMI of 40.0-44.9, adult ([place])   . PCOS (polycystic ovarian syndrome)   . Trochanteric bursitis of right hip    Past Surgical History:  Procedure Laterality Date  . NASAL SINUS SURGERY     Family History  Problem Relation Age of Onset  . Liver disease Mother        fatty liver, s/p liver biopsy  . Alcohol abuse Mother   . Hypertension Maternal Grandmother   . Glaucoma Maternal Grandmother   . Diabetes Maternal Grandfather   . Heart disease Maternal Grandfather    Social History   Social History  . Marital status: Divorced    Spouse name: N/A  . Number of children: 0  . Years of education: Masters   Occupational History  . Not on file.   Social History Main Topics  . Smoking status: Never Smoker  . Smokeless tobacco: Never Used  . Alcohol use No  . Drug use: No  . Sexual activity: Yes    Birth control/ protection: Condom, Pill   Other Topics Concern  .  Not on file   Social History Narrative   Drinks 2- 20oz pepsi a day    Interim medical history since last visit reviewed. Allergies and medications reviewed  Review of Systems Per HPI unless specifically indicated above     Objective:    BP 110/74   Pulse 91   Temp 97.8 F (36.6 C) (Oral)   Resp 16   Wt 249 lb 6.4 oz (113.1 kg)   LMP 05/15/2017   SpO2 96%   BMI 40.25 kg/m   Wt Readings from Last 3 Encounters:  06/08/17 249 lb 6.4 oz (113.1 kg)  01/25/17 250 lb (113.4 kg)  08/25/16 245 lb (111.1 kg)    Physical Exam  Constitutional: She appears well-developed and well-nourished. No distress.  Morbidly obese  HENT:  Head: Normocephalic and atraumatic.  Eyes: EOM are normal. No scleral icterus.  Neck: No thyromegaly present.  Cardiovascular: Normal rate, regular rhythm and normal heart sounds.   No murmur heard. Pulmonary/Chest: Effort normal and  breath sounds normal. No respiratory distress. She has no wheezes.  Abdominal: Soft. Bowel sounds are normal. She exhibits no distension.  Musculoskeletal: She exhibits no edema.  Neurological: She is alert. She exhibits normal muscle tone.  Skin: Skin is warm and dry. She is not diaphoretic. No pallor.  Psychiatric: She has a normal mood and affect. Her behavior is normal. Judgment and thought content normal.   Results for orders placed or performed during the hospital encounter of 08/15/16  Aerobic Culture (superficial specimen)  Result Value Ref Range   Specimen Description ABDOMEN    Special Requests NONE left abdomen    Gram Stain      ABUNDANT WBC PRESENT,BOTH PMN AND MONONUCLEAR RARE GRAM POSITIVE COCCI IN PAIRS    Culture      NO GROWTH 2 DAYS Performed at [facility]    Report Status 08/21/2016 FINAL       Assessment & Plan:   Problem List Items Addressed This Visit      Endocrine   PCOS (polycystic ovarian syndrome)    Start back on metformin, one a day for one week, then two a day; start back on OCP        Other   Morbid obesity ([place])    Encouraged pt to lose weight, start back on metformin, then increase dose from 500 mg daily to 1000 mg daily after one week      Relevant Medications   metFORMIN (GLUCOPHAGE XR) 500 MG 24 hr tablet    Other Visit Diagnoses    Diarrhea, unspecified type    -  Primary   doubt hypertrhyroid, though her GM had that; will check stool studies first   Relevant Orders   Gastrointestinal Pathogen Panel PCR   CBC with Differential/Platelet   COMPLETE METABOLIC PANEL WITH GFR   Magnesium   Irregular periods       with PCOS, off OCP; start back on OCP and metformin       Follow up plan: No Follow-up on file.  An after-visit summary was printed and given to the patient at [place].  Please see the patient instructions which may contain other information and recommendations beyond what is mentioned above in the  assessment and plan.  Meds ordered this encounter  Medications  . Cholecalciferol (VITAMIN D-3) 1000 units CAPS    Sig: Take 1,000 capsules by mouth daily.  . norethindrone-ethinyl estradiol (MICROGESTIN,JUNEL,LOESTRIN) 1-20 MG-MCG tablet    Sig: Take 1 tablet by mouth  daily.    Dispense:  1 Package    Refill:  1  . metFORMIN (GLUCOPHAGE XR) 500 MG 24 hr tablet    Sig: Take 2 tablets (1,000 mg total) by mouth daily with breakfast.    Dispense:  60 tablet    Refill:  5    Switching from short-acting to long-acting    Orders Placed This Encounter  Procedures  . Gastrointestinal Pathogen Panel PCR  . CBC with Differential/Platelet  . COMPLETE METABOLIC PANEL WITH GFR  . Magnesium

## 2017-06-08 NOTE — Patient Instructions (Addendum)
Let's get stool studies and blood work today Start back on the metformin, just one a day for one week, then two a day (okay to take at same time) Start back on the birth control pill Let me know if any unresolved samples

## 2017-06-08 NOTE — Telephone Encounter (Signed)
Jamie from [facility] called for pt's last pap date and the results.  Information given.

## 2017-06-08 NOTE — Assessment & Plan Note (Signed)
Start back on metformin, one a day for one week, then two a day; start back on OCP

## 2017-06-09 ENCOUNTER — Telehealth: Payer: Self-pay

## 2017-06-09 LAB — MAGNESIUM: Magnesium: 2.2 mg/dL (ref 1.5–2.5)

## 2017-06-09 NOTE — Telephone Encounter (Signed)
Connie Sims to inform me they canceled her stool culture order due to not receiving the stool. I mention to them she just received the kit yesterday and she most likely  will not bring the stool sample in in till today or early next week. She states they still have to cancel the order since they did not receive the sample.

## 2017-06-09 NOTE — Telephone Encounter (Signed)
Thank you Maybe you could ask the best way for [place] to handle these types of orders, either to pend them or something so we don't have them cancel them and have to re-enter them later When she returns the stool, please re-order Thank you for letting me know what happened

## 2017-06-12 ENCOUNTER — Encounter: Payer: Self-pay | Admitting: Family Medicine

## 2017-06-12 ENCOUNTER — Other Ambulatory Visit: Payer: Self-pay | Admitting: Family Medicine

## 2017-06-12 DIAGNOSIS — R740 Nonspecific elevation of levels of transaminase and lactic acid dehydrogenase [LDH]: Secondary | ICD-10-CM

## 2017-06-12 DIAGNOSIS — R7401 Elevation of levels of liver transaminase levels: Secondary | ICD-10-CM

## 2017-06-12 DIAGNOSIS — D649 Anemia, unspecified: Secondary | ICD-10-CM | POA: Insufficient documentation

## 2017-06-12 NOTE — Progress Notes (Signed)
Start iron daily; recheck labs in 1 month, orders entered

## 2017-06-12 NOTE — Assessment & Plan Note (Signed)
Check CBC and ferritin in one month; start iron therapy

## 2017-06-12 NOTE — Assessment & Plan Note (Signed)
Check labs in one month after weight loss and low fat diet

## 2017-06-14 ENCOUNTER — Encounter: Payer: Self-pay | Admitting: Family Medicine

## 2017-06-14 ENCOUNTER — Ambulatory Visit (INDEPENDENT_AMBULATORY_CARE_PROVIDER_SITE_OTHER): Payer: 59 | Admitting: Family Medicine

## 2017-06-14 VITALS — BP 118/74 | HR 97 | Temp 98.1°F | Resp 14 | Wt 244.7 lb

## 2017-06-14 DIAGNOSIS — R197 Diarrhea, unspecified: Secondary | ICD-10-CM | POA: Diagnosis not present

## 2017-06-14 DIAGNOSIS — R110 Nausea: Secondary | ICD-10-CM | POA: Diagnosis not present

## 2017-06-14 DIAGNOSIS — R1011 Right upper quadrant pain: Secondary | ICD-10-CM

## 2017-06-14 LAB — CBC WITH DIFFERENTIAL/PLATELET
Basophils Absolute: 0 cells/uL (ref 0–200)
Basophils Relative: 0 %
Eosinophils Absolute: 150 cells/uL (ref 15–500)
Eosinophils Relative: 2 %
HCT: 37.5 % (ref 35.0–45.0)
Hemoglobin: 11.8 g/dL (ref 11.7–15.5)
Lymphocytes Relative: 22 %
Lymphs Abs: 1650 cells/uL (ref 850–3900)
MCH: 24.5 pg — ABNORMAL LOW (ref 27.0–33.0)
MCHC: 31.5 g/dL — ABNORMAL LOW (ref 32.0–36.0)
MCV: 78 fL — ABNORMAL LOW (ref 80.0–100.0)
MPV: 9.3 fL (ref 7.5–12.5)
Monocytes Absolute: 450 cells/uL (ref 200–950)
Monocytes Relative: 6 %
Neutro Abs: 5250 cells/uL (ref 1500–7800)
Neutrophils Relative %: 70 %
Platelets: 328 10*3/uL (ref 140–400)
RBC: 4.81 MIL/uL (ref 3.80–5.10)
RDW: 17.8 % — ABNORMAL HIGH (ref 11.0–15.0)
WBC: 7.5 10*3/uL (ref 3.8–10.8)

## 2017-06-14 LAB — COMPLETE METABOLIC PANEL WITH GFR
ALT: 44 U/L — ABNORMAL HIGH (ref 6–29)
AST: 26 U/L (ref 10–30)
Albumin: 4.3 g/dL (ref 3.6–5.1)
Alkaline Phosphatase: 102 U/L (ref 33–115)
BUN: 15 mg/dL (ref 7–25)
CO2: 24 mmol/L (ref 20–31)
Calcium: 9.6 mg/dL (ref 8.6–10.2)
Chloride: 101 mmol/L (ref 98–110)
Creat: 0.89 mg/dL (ref 0.50–1.10)
GFR, Est African American: >89 mL/min (ref >=60)
GFR, Est Non African American: 83 mL/min (ref >=60)
Glucose, Bld: 96 mg/dL (ref 65–99)
Potassium: 4.7 mmol/L (ref 3.5–5.3)
Sodium: 136 mmol/L (ref 135–146)
Total Bilirubin: 0.4 mg/dL (ref 0.2–1.2)
Total Protein: 7.4 g/dL (ref 6.1–8.1)

## 2017-06-14 NOTE — Patient Instructions (Addendum)
HOLD the metformin for now until stomach symptoms are resolved We'll get an ultrasound today of your liver and gallbladder We'll get stool samples tomorrow if nothing today needs to be addressed urgently Please do go to urgent care or the ER if symptoms get severe

## 2017-06-14 NOTE — Progress Notes (Signed)
BP 118/74   Pulse 97   Temp 98.1 F (36.7 C) (Oral)   Resp 14   Wt 244 lb 11.2 oz (111 kg)   LMP 05/15/2017   SpO2 98%   BMI 39.50 kg/m    Subjective:    Patient ID: Connie Sims, female    DOB: 10/08/1981, 36 y.o.   MRN: [id number]  HPI: Connie Sims is a 36 y.o. female  Chief Complaint  Patient presents with  . Abdominal Pain    diarrhea, and nausea    HPI Patient is here for f/u visit, symptoms have returned Diarrhea returned on Saturday It was like someone just turned a faucet open wide She took one Imodium and that stopped the stooling No rectal pain She has pain in the lower abdomen, not up in RUQ No mucous and no blood Usually 97 degrees; no cold chills No travel since 2014 Appetite is poor, making herself eat; scrambled eggs and toast No abnormal vaginal bleeding since starting the medicine No burning with urination Reviewed prior US and CT scan; fatty liver; no gallstones on previous scan SGPT had risen slightly on last labs  Depression screen [facility] 2/9 06/08/2017 08/17/2016  Decreased Interest 0 0  Down, Depressed, Hopeless 0 0  PHQ - 2 Score 0 0   Relevant past medical, surgical, family and social history reviewed Past Medical History:  Diagnosis Date  . Allergic rhinitis   . Allergic rhinitis with postnasal drip   . Congenital osteopetrosis    previously managed by specialist at [facility], periodic hearing tests  . Diabetes mellitus without complication ([place])   . Epigastric pain   . Foot fracture, right    Jones fx 5th metatarsal  . Impaired fasting glucose   . Insomnia   . Low back pain   . LUQ pain   . Microcytosis   . Morbid obesity with BMI of 40.0-44.9, adult ([place])   . PCOS (polycystic ovarian syndrome)   . Trochanteric bursitis of right hip    Past Surgical History:  Procedure Laterality Date  . NASAL SINUS SURGERY     Family History  Problem Relation Age of Onset  . Liver disease Mother        fatty liver, s/p liver biopsy    . Alcohol abuse Mother   . Hypertension Maternal Grandmother   . Glaucoma Maternal Grandmother   . Diabetes Maternal Grandfather   . Heart disease Maternal Grandfather    Social History   Social History  . Marital status: Divorced    Spouse name: N/A  . Number of children: 0  . Years of education: Masters   Occupational History  . Not on file.   Social History Main Topics  . Smoking status: Never Smoker  . Smokeless tobacco: Never Used  . Alcohol use No  . Drug use: No  . Sexual activity: Yes    Birth control/ protection: Condom, Pill   Other Topics Concern  . Not on file   Social History Narrative   Drinks 2- 20oz pepsi a day   Interim medical history since last visit reviewed. Allergies and medications reviewed  Review of Systems Per HPI unless specifically indicated above     Objective:    BP 118/74   Pulse 97   Temp 98.1 F (36.7 C) (Oral)   Resp 14   Wt 244 lb 11.2 oz (111 kg)   LMP 05/15/2017   SpO2 98%   BMI 39.50 kg/m   Wt  Readings from Last 3 Encounters:  06/14/17 244 lb 11.2 oz (111 kg)  06/08/17 249 lb 6.4 oz (113.1 kg)  01/25/17 250 lb (113.4 kg)    Physical Exam  Constitutional: She appears well-developed and well-nourished. No distress.  Obese; weight loss 4+ pounds since last week  HENT:  Head: Normocephalic and atraumatic.  Eyes: EOM are normal. No scleral icterus.  Neck: No thyromegaly present.  Cardiovascular: Normal rate, regular rhythm and normal heart sounds.   No murmur heard. Pulmonary/Chest: Effort normal and breath sounds normal.  Abdominal: Soft. Normal appearance and bowel sounds are normal. She exhibits no distension. There is tenderness. There is positive Murphy's sign. There is no rigidity and no guarding.  Musculoskeletal: She exhibits no edema.  Neurological: She is alert. She exhibits normal muscle tone.  Skin: Skin is warm and dry. She is not diaphoretic. No pallor.  Psychiatric: She has a normal mood and affect.  Her behavior is normal. Judgment and thought content normal.    Results for orders placed or performed in visit on 06/08/17  HM PAP SMEAR  Result Value Ref Range   HM Pap smear neg, no HPV done       Assessment & Plan:   Problem List Items Addressed This Visit    None    Visit Diagnoses    RUQ pain    -  Primary   stop metformin for now; STAT RUQ [place] and CBC and CMP; to ER if worse   Relevant Orders   US Abdomen Limited RUQ   CBC with Differential/Platelet   COMPLETE METABOLIC PANEL WITH GFR   Nausea       STAT RUQ [place], labs; stop metformin for now; discussed ddx, if acute cholecystitis, then will need surgery   Relevant Orders   US Abdomen Limited RUQ   Diarrhea, unspecified type       encouraged patient to return the stool studies at her earliest convenience       Follow up plan: No Follow-up on file.  An after-visit summary was printed and given to the patient at [place].  Please see the patient instructions which may contain other information and recommendations beyond what is mentioned above in the assessment and plan.  Meds ordered this encounter  Medications  . Fe Bisgly-Succ-C-Thre-B12-FA (IRON-150 PO)    Sig: Take by mouth.    Orders Placed This Encounter  Procedures  . US Abdomen Limited RUQ  . CBC with Differential/Platelet  . COMPLETE METABOLIC PANEL WITH GFR

## 2017-06-15 ENCOUNTER — Other Ambulatory Visit: Payer: Self-pay | Admitting: Family Medicine

## 2017-06-15 ENCOUNTER — Ambulatory Visit
Admission: RE | Admit: 2017-06-15 | Discharge: 2017-06-15 | Disposition: A | Discharge status: Home / Self Care | Payer: 59 | Source: Ambulatory Visit | Attending: Family Medicine | Admitting: Family Medicine

## 2017-06-15 DIAGNOSIS — R932 Abnormal findings on diagnostic imaging of liver and biliary tract: Secondary | ICD-10-CM | POA: Diagnosis not present

## 2017-06-15 DIAGNOSIS — R1011 Right upper quadrant pain: Secondary | ICD-10-CM | POA: Insufficient documentation

## 2017-06-15 DIAGNOSIS — R110 Nausea: Secondary | ICD-10-CM | POA: Diagnosis not present

## 2017-06-15 DIAGNOSIS — R718 Other abnormality of red blood cells: Secondary | ICD-10-CM

## 2017-06-15 DIAGNOSIS — R7401 Elevation of levels of liver transaminase levels: Secondary | ICD-10-CM

## 2017-06-15 DIAGNOSIS — R740 Nonspecific elevation of levels of transaminase and lactic acid dehydrogenase [LDH]: Principal | ICD-10-CM

## 2017-06-15 NOTE — Assessment & Plan Note (Signed)
Refer to GI 

## 2017-06-19 ENCOUNTER — Encounter: Payer: Self-pay | Admitting: Gastroenterology

## 2017-06-19 LAB — GASTROINTESTINAL PATHOGEN PANEL PCR
C. difficile Tox A/B, PCR: NOT DETECTED
Campylobacter, PCR: NOT DETECTED
Cryptosporidium, PCR: NOT DETECTED
E coli (ETEC) LT/ST PCR: NOT DETECTED
E coli (STEC) stx1/stx2, PCR: NOT DETECTED
E coli 0157, PCR: NOT DETECTED
Giardia lamblia, PCR: NOT DETECTED
Norovirus, PCR: NOT DETECTED
Rotavirus A, PCR: NOT DETECTED
Salmonella, PCR: NOT DETECTED
Shigella, PCR: NOT DETECTED

## 2017-06-28 ENCOUNTER — Encounter: Payer: Self-pay | Admitting: Family Medicine

## 2017-06-28 DIAGNOSIS — N938 Other specified abnormal uterine and vaginal bleeding: Secondary | ICD-10-CM

## 2017-06-29 ENCOUNTER — Other Ambulatory Visit: Payer: Self-pay | Admitting: Family Medicine

## 2017-06-29 DIAGNOSIS — N938 Other specified abnormal uterine and vaginal bleeding: Secondary | ICD-10-CM | POA: Insufficient documentation

## 2017-06-29 DIAGNOSIS — R197 Diarrhea, unspecified: Secondary | ICD-10-CM | POA: Insufficient documentation

## 2017-06-29 NOTE — Assessment & Plan Note (Signed)
Check TSH, CBC, POCT urine pregnancy

## 2017-06-29 NOTE — Assessment & Plan Note (Signed)
Refer to GI, persistent diarrhea

## 2017-07-04 ENCOUNTER — Ambulatory Visit (INDEPENDENT_AMBULATORY_CARE_PROVIDER_SITE_OTHER): Payer: 59 | Admitting: Advanced Practice Midwife

## 2017-07-04 ENCOUNTER — Encounter: Payer: Self-pay | Admitting: Advanced Practice Midwife

## 2017-07-04 ENCOUNTER — Ambulatory Visit
Admission: RE | Admit: 2017-07-04 | Discharge: 2017-07-04 | Disposition: A | Discharge status: Home / Self Care | Payer: 59 | Source: Ambulatory Visit | Attending: Family Medicine | Admitting: Family Medicine

## 2017-07-04 VITALS — BP 118/74 | HR 88 | Ht 65.0 in | Wt 246.0 lb

## 2017-07-04 DIAGNOSIS — R938 Abnormal findings on diagnostic imaging of other specified body structures: Secondary | ICD-10-CM | POA: Insufficient documentation

## 2017-07-04 DIAGNOSIS — Z01419 Encounter for gynecological examination (general) (routine) without abnormal findings: Secondary | ICD-10-CM | POA: Diagnosis not present

## 2017-07-04 DIAGNOSIS — Z3041 Encounter for surveillance of contraceptive pills: Secondary | ICD-10-CM

## 2017-07-04 DIAGNOSIS — N938 Other specified abnormal uterine and vaginal bleeding: Secondary | ICD-10-CM | POA: Insufficient documentation

## 2017-07-04 DIAGNOSIS — Z124 Encounter for screening for malignant neoplasm of cervix: Secondary | ICD-10-CM

## 2017-07-04 MED ORDER — NORGESTIMATE-ETH ESTRADIOL 0.25-35 MG-MCG PO TABS: 1.0000 | ORAL_TABLET | Freq: Every day | ORAL | 11 refills | Status: DC

## 2017-07-04 NOTE — Progress Notes (Signed)
Patient ID: Connie Sims, female   DOB: 04-10-1981, 36 y.o.   MRN: [id number]     Gynecology Annual Exam  PCP: Arnetha Courser, MD  Chief Complaint:  Chief Complaint  Patient presents with  . Gynecologic Exam    Bleeding since May    History of Present Illness: Patient is a 36 y.o. No obstetric history on file. presents for annual exam. The patient has complaints today of irregular periods and bleeding since 05/19/17. She usually has a period every 1-3 months that lasts for 3-5 days and is light. Since May of this year she has had an ongoing period that has been sometimes heavy. She has been to her PCP about this and was started on a low dose OCP. She is requesting a higher dose OCP today for period regulation.    LMP: Patient's last menstrual period was 05/19/2017. Average Interval: irregular, 30-90 days Duration of flow: 4 days Heavy Menses: no Clots: no Intermenstrual Bleeding: no Postcoital Bleeding: no Dysmenorrhea: no  The patient is sexually active. She currently uses OCP (estrogen/progesterone) for contraception. She denies dyspareunia.  The patient does not perform self breast exams.  There is no notable family history of breast or ovarian cancer in her family.  The patient wears seatbelts: yes.   The patient has regular exercise: she is active at work but denies vigorous physical activity. She states she is trying to improve her diet. She admits to regular intake of sodas.    The patient denies current symptoms of depression.    Review of Systems: Review of Systems  Constitutional: Negative.   HENT: Negative.   Eyes: Negative.   Respiratory: Negative.   Cardiovascular: Negative.   Gastrointestinal: Negative.   Genitourinary: Negative.   Musculoskeletal: Negative.   Skin: Negative.   Neurological: Negative.   Endo/Heme/Allergies: Negative.   Psychiatric/Behavioral: Negative.     Past Medical History:  Past Medical History:  Diagnosis Date  . Allergic  rhinitis   . Allergic rhinitis with postnasal drip   . Congenital osteopetrosis    previously managed by specialist at [facility], periodic hearing tests  . Diabetes mellitus without complication ([place])   . Epigastric pain   . Foot fracture, right    Jones fx 5th metatarsal  . Impaired fasting glucose   . Insomnia   . Low back pain   . LUQ pain   . Microcytosis   . Morbid obesity with BMI of 40.0-44.9, adult ([place])   . PCOS (polycystic ovarian syndrome)   . Trochanteric bursitis of right hip     Past Surgical History:  Past Surgical History:  Procedure Laterality Date  . NASAL SINUS SURGERY      Gynecologic History:  Patient's last menstrual period was 05/19/2017. Contraception: OCP (estrogen/progesterone) Last Pap: Results were: no abnormalities   Obstetric History: No obstetric history on file.  Family History:  Family History  Problem Relation Age of Onset  . Liver disease Mother        fatty liver, s/p liver biopsy  . Alcohol abuse Mother   . Hypertension Maternal Grandmother   . Glaucoma Maternal Grandmother   . Diabetes Maternal Grandfather   . Heart disease Maternal Grandfather     Social History:  Social History   Social History  . Marital status: Divorced    Spouse name: N/A  . Number of children: 0  . Years of education: Masters   Occupational History  . Not on file.   Social History Main Topics  .  Smoking status: Never Smoker  . Smokeless tobacco: Never Used  . Alcohol use No  . Drug use: No  . Sexual activity: Yes    Birth control/ protection: Condom, Pill   Other Topics Concern  . Not on file   Social History Narrative   Drinks 2- 20oz pepsi a day    Allergies:  No Known Allergies  Medications: Prior to Admission medications   Medication Sig Start Date End Date Taking? Authorizing Provider  Cholecalciferol (VITAMIN D-3) 1000 units CAPS Take 1,000 capsules by mouth daily.   Yes [provider]  cyclobenzaprine (FLEXERIL) 5 MG  tablet Take 1 tablet (5 mg total) by mouth at bedtime. PRN muscle spasms 08/17/16  Yes Lada, Satira Anis, MD  Fe Bisgly-Succ-C-Thre-B12-FA (IRON-150 PO) Take by mouth.   Yes [provider]  metFORMIN (GLUCOPHAGE XR) 500 MG 24 hr tablet Take 2 tablets (1,000 mg total) by mouth daily with breakfast. 06/08/17  Yes Lada, Satira Anis, MD  norethindrone-ethinyl estradiol (MICROGESTIN,JUNEL,LOESTRIN) 1-20 MG-MCG tablet Take 1 tablet by mouth daily. 06/08/17  Yes Lada, Satira Anis, MD    Physical Exam Vitals: Blood pressure 118/74, pulse 88, height 5\' 5"  (1.651 m), weight 246 lb (111.6 kg), last menstrual period 05/19/2017.  General: NAD HEENT: normocephalic, anicteric Thyroid: no enlargement, no palpable nodules Pulmonary: No increased work of breathing, CTAB Cardiovascular: RRR, distal pulses 2+ Breast: Breast symmetrical, no tenderness, no palpable nodules or masses, no skin or nipple retraction present, no nipple discharge.  No axillary or supraclavicular lymphadenopathy. Abdomen: NABS, soft, non-tender, non-distended.  Umbilicus without lesions.  No hepatomegaly, splenomegaly or masses palpable. No evidence of hernia  Genitourinary:  External: Normal external female genitalia.  Normal urethral meatus, normal  Bartholin's and Skene's glands.    Vagina: Normal vaginal mucosa, no evidence of prolapse.    Cervix: Grossly normal in appearance, no bleeding, no CMT  Uterus: Non-enlarged, mobile, normal contour.    Adnexa: ovaries non-enlarged, no adnexal masses  Rectal: deferred  Lymphatic: no evidence of inguinal lymphadenopathy Extremities: no edema, erythema, or tenderness Neurologic: Grossly intact Psychiatric: mood appropriate, affect full    Assessment: 36 y.o. No obstetric history on file. Well woman exam with PAP smear  Plan: Problem List Items Addressed This Visit    None    Visit Diagnoses    Well woman exam with routine gynecological exam    -  Primary      1) STI screening  was offered and declined  2) ASCCP guidelines and rational discussed.  Patient opts for every 3 years screening interval  3) Contraception - Education given regarding options for contraception, including LARCs, Depo, Nexplanon, Pills. Patient chooses higher dose OCPs for now  4) Routine healthcare maintenance including cholesterol, diabetes screening discussed managed by PCP   5) Increase healthy lifestyle diet and exercise  6) Follow up 1 year for routine annual exam   Rod Can, CNM

## 2017-07-06 ENCOUNTER — Encounter: Payer: Self-pay | Admitting: Family Medicine

## 2017-07-06 LAB — IGP, APTIMA HPV
HPV Aptima: NEGATIVE
PAP Smear Comment: 0

## 2017-07-06 NOTE — Telephone Encounter (Signed)
Connie Sims, please contact radiologist and ask them to check measurements on the right ovary; thank you

## 2017-08-11 ENCOUNTER — Encounter: Payer: Self-pay | Admitting: Gastroenterology

## 2017-08-11 ENCOUNTER — Ambulatory Visit (INDEPENDENT_AMBULATORY_CARE_PROVIDER_SITE_OTHER): Payer: 59 | Admitting: Gastroenterology

## 2017-08-11 VITALS — BP 106/69 | HR 75 | Temp 97.9°F | Ht 65.0 in | Wt 245.0 lb

## 2017-08-11 DIAGNOSIS — R194 Change in bowel habit: Secondary | ICD-10-CM | POA: Diagnosis not present

## 2017-08-11 NOTE — Progress Notes (Signed)
Connie Darby, MD [address]  [place]  [place], [place] [address]  Main: [phone]  Fax: [phone]    Gastroenterology Consultation  Referring Provider:     Arnetha Courser, MD Primary Care Physician:  Arnetha Courser, MD Primary Gastroenterologist:  Dr. Cephas Sims Reason for Consultation:     Altered bowel habits        HPI:   Connie Sims is a 36 y.o. y/o female referred for consultation & management  by Dr. Arnetha Courser, MD of altered bowel habits, loose stools. She has no major medical problems. She has been experiencing anywhere from loose, soft stools to formed and hard stools over 1 year. She has about 2 BMs per day. She denies urgency, bloating, gas, abdominal pain, rectal bleeding, nausea, vomiting, constipation. She denies weight loss or loss of appetite. She is physically active at work, consumes Sod[place] 2/day. She denies upper GI symptoms. She denies any food triggers. She had mild anemia secondary to menorrhagia which lasted for about 7 weeks recently which is under control after changing her oral contraceptive medication.   Workup done thus far: GI pathogen panel negative, C. difficile negative, HIV negative, normal LFTs, ultrasound abdomen revealed fatty liver, ultrasound pelvis showed small posterior wall uterine leiomyoma 1 cm in diameter.  She denies smoking, alcohol, NSAID use, other over-the-counter meds She denies family history of any GI conditions including malignancy or IBD  GI Procedures: None  Past Medical History:  Diagnosis Date  . Allergic rhinitis   . Allergic rhinitis with postnasal drip   . Congenital osteopetrosis    previously managed by specialist at [facility], periodic hearing tests  . Diabetes mellitus without complication ([place])   . Epigastric pain   . Foot fracture, right    Jones fx 5th metatarsal  . Impaired fasting glucose   . Insomnia   . Low back pain   . LUQ pain   . Microcytosis   . Morbid obesity with BMI  of 40.0-44.9, adult ([place])   . PCOS (polycystic ovarian syndrome)   . Trochanteric bursitis of right hip     Past Surgical History:  Procedure Laterality Date  . NASAL SINUS SURGERY      Prior to Admission medications   Medication Sig Start Date End Date Taking? Authorizing Provider  Cholecalciferol (VITAMIN D-3) 1000 units CAPS Take 1,000 capsules by mouth daily.    [provider]  cyclobenzaprine (FLEXERIL) 5 MG tablet Take 1 tablet (5 mg total) by mouth at bedtime. PRN muscle spasms 08/17/16   Lada, Satira Anis, MD  Fe Bisgly-Succ-C-Thre-B12-FA (IRON-150 PO) Take by mouth.    [provider]  metFORMIN (GLUCOPHAGE XR) 500 MG 24 hr tablet Take 2 tablets (1,000 mg total) by mouth daily with breakfast. 06/08/17   Lada, Satira Anis, MD  norgestimate-ethinyl estradiol (ORTHO-CYCLEN,SPRINTEC,PREVIFEM) 0.25-35 MG-MCG tablet Take 1 tablet by mouth daily. 07/04/17   Rod Can, CNM    Family History  Problem Relation Age of Onset  . Liver disease Mother        fatty liver, s/p liver biopsy  . Alcohol abuse Mother   . Hypertension Maternal Grandmother   . Glaucoma Maternal Grandmother   . Diabetes Maternal Grandfather   . Heart disease Maternal Grandfather      Social History  Substance Use Topics  . Smoking status: Never Smoker  . Smokeless tobacco: Never Used  . Alcohol use No    Allergies as of 08/11/2017  . (  No Known Allergies)    Review of Systems:    All systems reviewed and negative except where noted in HPI.   Physical Exam:  BP 106/69   Pulse 75   Temp 97.9 F (36.6 C) (Oral)   Ht 5\' 5"  (1.651 m)   Wt 111.1 kg (245 lb)   BMI 40.77 kg/m  No LMP recorded. Patient is not currently having periods (Reason: Irregular Periods).  General:   Alert,  Well-developed, well-nourished, pleasant and cooperative in NAD Head:  Normocephalic and atraumatic. Eyes:  Sclera clear, no icterus.   Conjunctiva pink. Ears:  Normal auditory acuity. Nose:  No  deformity, discharge, or lesions. Mouth:  No deformity or lesions,oropharynx pink & moist. Neck:  Supple; no masses or thyromegaly. Lungs:  Respirations even and unlabored.  Clear throughout to auscultation.   No wheezes, crackles, or rhonchi. No acute distress. Heart:  Regular rate and rhythm; no murmurs, clicks, rubs, or gallops. Abdomen:  Normal bowel sounds.  No bruits.  Soft, non-tender and non-distended without masses, hepatosplenomegaly or hernias noted.  No guarding or rebound tenderness.  Rectal: Not performed   Msk:  Symmetrical without gross deformities. Good, equal movement & strength bilaterally. Pulses:  Normal pulses noted. Extremities:  No clubbing or edema.  No cyanosis. Neurologic:  Alert and oriented x3;  grossly normal neurologically. Skin:  Intact without significant lesions or rashes. No jaundice. Lymph Nodes:  No significant cervical adenopathy. Psych:  Alert and cooperative. Normal mood and affect.  Imaging Studies: No results found.  Assessment and Plan:   HALEN MOSSBARGER is a 36 y.o. y/o female with approximately 1 year history of loose stools but occurring only 2 times per day and these were nonbloody, not associated with any other lower GI symptoms. She does not have any constitutional symptoms, no family history of colon cancer. This is probably diet related or functional. I will rule out celiac disease, check CRP. If these are negative, then I will manage symptomatically. I have encouraged her to avoid soda, low-carb, low-fat diet, loose some weight. I will defer colonoscopy for now.  Follow up in 3 months   Connie Darby, MD

## 2017-08-11 NOTE — Patient Instructions (Addendum)
1. Check for celiac disease 2. Low fat, low carb diet 3. Avoid sodas  Please call our office to speak with my nurse Driscilla Grammes at [phone] during business hours from 8am to 4pm if you have any questions/concerns. During after hours, you will be redirected to on call GI physician. For any emergency please call 911 or go the nearest emergency room.    Cephas Darby, MD [address]  [place]  [place], [place] [address]  Main: [phone]  Fax: [phone]

## 2017-11-22 ENCOUNTER — Telehealth: Payer: Self-pay | Admitting: Family Medicine

## 2017-11-22 NOTE — Telephone Encounter (Signed)
Patient never had her labs rechecked Last visit almost six months ago Please ask pt to schedule an appt with me in the next few weeks We have some unresolved issues to go over (anemia, liver tests, vit D deficiency, prediabetes) Please ask her to come fasting for labs that day

## 2017-11-24 ENCOUNTER — Ambulatory Visit: Payer: 59 | Admitting: Family Medicine

## 2017-12-05 NOTE — Telephone Encounter (Signed)
Left voice message for patient to schedule appt with dr lada

## 2017-12-06 NOTE — Telephone Encounter (Signed)
appt made for 12/22/17

## 2017-12-22 ENCOUNTER — Ambulatory Visit: Payer: 59 | Admitting: Family Medicine

## 2018-01-28 ENCOUNTER — Encounter: Payer: Self-pay | Admitting: Family Medicine

## 2018-02-15 ENCOUNTER — Encounter: Payer: Self-pay | Admitting: Family Medicine

## 2018-02-15 ENCOUNTER — Ambulatory Visit (INDEPENDENT_AMBULATORY_CARE_PROVIDER_SITE_OTHER): Payer: 59 | Admitting: Family Medicine

## 2018-02-15 VITALS — BP 126/74 | HR 96 | Temp 98.5°F | Resp 16 | Wt 239.0 lb

## 2018-02-15 DIAGNOSIS — R197 Diarrhea, unspecified: Secondary | ICD-10-CM | POA: Diagnosis not present

## 2018-02-15 DIAGNOSIS — R2231 Localized swelling, mass and lump, right upper limb: Secondary | ICD-10-CM | POA: Diagnosis not present

## 2018-02-15 DIAGNOSIS — N6323 Unspecified lump in the left breast, lower outer quadrant: Secondary | ICD-10-CM

## 2018-02-15 DIAGNOSIS — K9041 Non-celiac gluten sensitivity: Secondary | ICD-10-CM | POA: Diagnosis not present

## 2018-02-15 NOTE — Progress Notes (Signed)
BP 126/74 (BP Location: Right Arm)   Pulse 96   Temp 98.5 F (36.9 C) (Oral)   Resp 16   Wt 239 lb (108.4 kg)   LMP 12/04/2017   SpO2 97%   BMI 39.77 kg/m    Subjective:    Patient ID: Connie Sims, female    DOB: [birth year], 37 y.o.   MRN: [id number]  HPI: Connie Sims is a 37 y.o. female  Chief Complaint  Patient presents with  . Arm Problem    right arm pit sore spot, pt states that over the last week it has gotten better and went down.    HPI Patient is here for an acute visit Intolerant to soy sauce, onion, sausage, granny smith apples (other apples okay) Saw GI; really changed diet, diarrhea is gone Lost weight with healthier eating  She had an issue last week; washing and felt a tender spot in the arm; turned out to be a pimple; back down Would like to have lymph nodes checked  Tender to sit down, thinks it is her fibroid; goes to [facility]; no treatment option July 2018 [place]  Depression screen [facility] 2/9 02/15/2018 06/08/2017 08/17/2016  Decreased Interest 0 0 0  Down, Depressed, Hopeless 0 0 0  PHQ - 2 Score 0 0 0    Relevant past medical, surgical, family and social history reviewed Past Medical History:  Diagnosis Date  . Allergic rhinitis   . Allergic rhinitis with postnasal drip   . Congenital osteopetrosis    previously managed by specialist at [facility], periodic hearing tests  . Diabetes mellitus without complication ([place])   . Epigastric pain   . Foot fracture, right    Jones fx 5th metatarsal  . Impaired fasting glucose   . Insomnia   . Low back pain   . LUQ pain   . Microcytosis   . Morbid obesity with BMI of 40.0-44.9, adult ([place])   . PCOS (polycystic ovarian syndrome)   . Trochanteric bursitis of right hip    Past Surgical History:  Procedure Laterality Date  . NASAL SINUS SURGERY     Family History  Problem Relation Age of Onset  . Liver disease Mother        fatty liver, s/p liver biopsy  . Alcohol abuse Mother   . Hypertension  Maternal Grandmother   . Glaucoma Maternal Grandmother   . Diabetes Maternal Grandfather   . Heart disease Maternal Grandfather    Social History   Tobacco Use  . Smoking status: Never Smoker  . Smokeless tobacco: Never Used  Substance Use Topics  . Alcohol use: No    Alcohol/week: 0.0 oz  . Drug use: No    Interim medical history since last visit reviewed. Allergies and medications reviewed  Review of Systems Per HPI unless specifically indicated above     Objective:    BP 126/74 (BP Location: Right Arm)   Pulse 96   Temp 98.5 F (36.9 C) (Oral)   Resp 16   Wt 239 lb (108.4 kg)   LMP 12/04/2017   SpO2 97%   BMI 39.77 kg/m   Wt Readings from Last 3 Encounters:  02/15/18 239 lb (108.4 kg)  08/11/17 245 lb (111.1 kg)  07/04/17 246 lb (111.6 kg)    Physical Exam  Constitutional: She appears well-developed and well-nourished. No distress.  Eyes: EOM are normal. No scleral icterus.  Neck: No thyromegaly present.  Cardiovascular: Normal rate.  Pulmonary/Chest: Effort normal. Right breast  exhibits no inverted nipple, no mass, no nipple discharge, no skin change and no tenderness. Left breast exhibits mass (5 o'clock position). Left breast exhibits no inverted nipple, no nipple discharge, no skin change and no tenderness.  Solitary oblong subcutaneous swelling under the RIGHT axilla; no overlying skin changes  Abdominal: She exhibits no distension.  Skin: No lesion noted. No pallor.  Psychiatric: She has a normal mood and affect. Her behavior is normal. Judgment and thought content normal.      Assessment & Plan:   Problem List Items Addressed This Visit      Other   Wheat intolerance    Reviewed the food intolerance      Diarrhea    Much improved with changes to diet       Other Visit Diagnoses    Breast lump on left side at 5 o'clock position    -  Primary   will get US of the left breast   Relevant Orders   US BREAST LTD UNI LEFT INC AXILLA   MM  Digital Diagnostic Bilat   Axillary lump, right       I do not see anything to suggest recent boil or abscess; will get [place]   Relevant Orders   US BREAST LTD UNI RIGHT INC AXILLA   MM Digital Diagnostic Bilat       Follow up plan: Return if symptoms worsen or fail to improve.  An after-visit summary was printed and given to the patient at [facility].  Please see the patient instructions which may contain other information and recommendations beyond what is mentioned above in the assessment and plan.  No orders of the defined types were placed in this encounter.   Orders Placed This Encounter  Procedures  . US BREAST LTD UNI LEFT INC AXILLA  . US BREAST LTD UNI RIGHT INC AXILLA  . MM Digital Diagnostic Bilat

## 2018-02-15 NOTE — Assessment & Plan Note (Signed)
Reviewed the food intolerance

## 2018-02-15 NOTE — Patient Instructions (Addendum)
We'll get ultrasounds of your breasts and underarms If you have not heard anything from my staff in a week about any orders/referrals/studies from today, please contact us here to follow-up [phone] Please do contact your gynecologist about your pelvic discomfort Keep up the great job with your weight loss

## 2018-02-20 NOTE — Assessment & Plan Note (Signed)
Much improved with changes to diet

## 2018-03-09 ENCOUNTER — Encounter: Payer: Self-pay | Admitting: Family Medicine

## 2018-03-12 ENCOUNTER — Telehealth: Payer: Self-pay

## 2018-03-12 DIAGNOSIS — R2231 Localized swelling, mass and lump, right upper limb: Secondary | ICD-10-CM

## 2018-03-12 DIAGNOSIS — N6323 Unspecified lump in the left breast, lower outer quadrant: Secondary | ICD-10-CM

## 2018-03-12 NOTE — Telephone Encounter (Signed)
Signed, thank you

## 2018-03-12 NOTE — Telephone Encounter (Signed)
I called to see if we could get this imaging scheduled and they stated that it needed to be corrected first.  New order has been placed.

## 2018-03-29 ENCOUNTER — Ambulatory Visit
Admission: RE | Admit: 2018-03-29 | Discharge: 2018-03-29 | Disposition: A | Discharge status: Home / Self Care | Payer: 59 | Source: Ambulatory Visit | Attending: Family Medicine | Admitting: Family Medicine

## 2018-03-29 DIAGNOSIS — N6323 Unspecified lump in the left breast, lower outer quadrant: Secondary | ICD-10-CM | POA: Diagnosis present

## 2018-03-29 DIAGNOSIS — R2231 Localized swelling, mass and lump, right upper limb: Secondary | ICD-10-CM | POA: Diagnosis not present

## 2018-03-30 ENCOUNTER — Telehealth: Payer: Self-pay

## 2018-03-30 DIAGNOSIS — N6323 Unspecified lump in the left breast, lower outer quadrant: Secondary | ICD-10-CM

## 2018-03-30 NOTE — Telephone Encounter (Signed)
-----   Message from Arnetha Courser, MD sent at 03/29/2018  4:48 PM EDT ----- Please let pt know results; recommended referral to surgeon for evaluation and order referral to surgeon of choice; thank you

## 2018-03-30 NOTE — Telephone Encounter (Signed)
Called pt, no answer. LM for pt informing pt of the need to to f/u with surgeon regarding breast mass per Dr.Lada's recommendation. Referral placed. Pt to call back for questions or concerns. CRM created. Labs routed to [facility].

## 2018-04-02 ENCOUNTER — Encounter: Payer: Self-pay | Admitting: *Deleted

## 2018-04-24 ENCOUNTER — Other Ambulatory Visit: Payer: Self-pay | Admitting: Family Medicine

## 2018-04-24 NOTE — Telephone Encounter (Signed)
Meds ordered this encounter  Medications  . cyclobenzaprine (FLEXERIL) 5 MG tablet    Sig: TAKE 1 TABLET (5 MG TOTAL) BY MOUTH AT BEDTIME. PRN MUSCLE SPASMS    Dispense:  30 tablet    Refill:  1

## 2018-04-27 ENCOUNTER — Other Ambulatory Visit: Payer: Self-pay | Admitting: Family Medicine

## 2018-05-03 ENCOUNTER — Ambulatory Visit: Payer: 59 | Admitting: General Surgery

## 2018-05-09 ENCOUNTER — Encounter: Payer: Self-pay | Admitting: *Deleted

## 2018-05-10 ENCOUNTER — Encounter: Payer: Self-pay | Admitting: Family Medicine

## 2018-05-10 DIAGNOSIS — D219 Benign neoplasm of connective and other soft tissue, unspecified: Secondary | ICD-10-CM

## 2018-05-10 DIAGNOSIS — R102 Pelvic and perineal pain: Secondary | ICD-10-CM

## 2018-05-21 ENCOUNTER — Other Ambulatory Visit: Payer: Self-pay | Admitting: Advanced Practice Midwife

## 2018-05-21 DIAGNOSIS — Z3041 Encounter for surveillance of contraceptive pills: Secondary | ICD-10-CM

## 2018-05-30 ENCOUNTER — Encounter: Payer: 59 | Admitting: Obstetrics and Gynecology

## 2018-06-14 ENCOUNTER — Ambulatory Visit (INDEPENDENT_AMBULATORY_CARE_PROVIDER_SITE_OTHER): Payer: 59 | Admitting: Obstetrics and Gynecology

## 2018-06-14 ENCOUNTER — Encounter: Payer: Self-pay | Admitting: Obstetrics and Gynecology

## 2018-06-14 VITALS — BP 95/63 | HR 69 | Ht 66.0 in | Wt 245.8 lb

## 2018-06-14 DIAGNOSIS — D259 Leiomyoma of uterus, unspecified: Secondary | ICD-10-CM | POA: Diagnosis not present

## 2018-06-14 DIAGNOSIS — R1032 Left lower quadrant pain: Secondary | ICD-10-CM | POA: Insufficient documentation

## 2018-06-14 DIAGNOSIS — E282 Polycystic ovarian syndrome: Secondary | ICD-10-CM

## 2018-06-14 NOTE — Progress Notes (Signed)
GYN ENCOUNTER NOTE  Subjective:       Connie Sims is a 37 y.o. G0P0 female is here for gynecologic evaluation of the following issues:  1.  Left lower quadrant pain.   2.  Uterine fibroid  37 year old single white female para 0, LMP 06/03/2018, using oral contraceptives for birth control and cycle regulation, presents in referral from Dr. Theresa Duty for evaluation of cyclic left lower quadrant pain and uterine fibroid. July 2018 demonstrates patient to have a 10 mm uterine fibroid. Patient has been experiencing cyclic left lower quadrant discomfort occurring approximately 2 to 7 days prior to menses.  The pain would last for hours up to as long as 2 days.  The pain is characterized as severe and constant ache.  The pain has been severe enough to cause the patient to miss work on occasion.  The patient does not report any GI or GU symptoms.  Pain often well impaired walking as it takes her breath away.  There are no obvious exacerbating factors.  Alleviating factors include Advil 400 mg.  There is no low backache or radiation of pain into the buttocks or legs.  She is not currently sexually active and has not experienced any significant deep thrusting dyspareunia.  There is no family history of endometriosis. She does not report any history of STDs, any history of abnormal Pap smears. Other significant comorbidities in this patient include polycystic ovary disease, type 2 diabetes mellitus, and morbid obesity   Gynecologic History Patient's last menstrual period was 06/03/2018 (exact date). Contraception: OCP (estrogen/progesterone) Last Pap: Normal Last mammogram: N/A  Obstetric History OB History  Gravida Para Term Preterm AB Living  0            SAB TAB Ectopic Multiple Live Births               Past Medical History:  Diagnosis Date  . Allergic rhinitis   . Allergic rhinitis with postnasal drip   . Congenital osteopetrosis    previously managed by specialist at [facility], periodic  hearing tests  . Diabetes mellitus without complication ([place])   . Epigastric pain   . Foot fracture, right    Jones fx 5th metatarsal  . Impaired fasting glucose   . Insomnia   . Low back pain   . LUQ pain   . Microcytosis   . Morbid obesity with BMI of 40.0-44.9, adult ([place])   . PCOS (polycystic ovarian syndrome)   . Trochanteric bursitis of right hip     Past Surgical History:  Procedure Laterality Date  . NASAL SINUS SURGERY      Current Outpatient Medications on File Prior to Visit  Medication Sig Dispense Refill  . Cholecalciferol (VITAMIN D-3) 1000 units CAPS Take 1,000 capsules by mouth daily.    . cyclobenzaprine (FLEXERIL) 5 MG tablet TAKE 1 TABLET (5 MG TOTAL) BY MOUTH AT BEDTIME. PRN MUSCLE SPASMS 30 tablet 1  . Fe Bisgly-Succ-C-Thre-B12-FA (IRON-150 PO) Take by mouth.    . metFORMIN (GLUCOPHAGE-XR) 500 MG 24 hr tablet TAKE 2 TABLETS (1,000 MG TOTAL) BY MOUTH DAILY WITH BREAKFAST. 60 tablet 2  . norgestimate-ethinyl estradiol (ORTHO-CYCLEN,SPRINTEC,PREVIFEM) 0.25-35 MG-MCG tablet Take 1 tablet by mouth daily. 1 Package 11   No current facility-administered medications on file prior to visit.     No Known Allergies  Social History   Socioeconomic History  . Marital status: Divorced    Spouse name: Not on file  . Number of children: 0  .  Years of education: Masters  . Highest education level: Not on file  Occupational History  . Not on file  Social Needs  . Financial resource strain: Not on file  . Food insecurity:    Worry: Not on file    Inability: Not on file  . Transportation needs:    Medical: Not on file    Non-medical: Not on file  Tobacco Use  . Smoking status: Never Smoker  . Smokeless tobacco: Never Used  Substance and Sexual Activity  . Alcohol use: No    Alcohol/week: 0.0 oz  . Drug use: No  . Sexual activity: Not Currently    Birth control/protection: Condom, Pill  Lifestyle  . Physical activity:    Days per week: Not on file     Minutes per session: Not on file  . Stress: Not on file  Relationships  . Social connections:    Talks on phone: Not on file    Gets together: Not on file    Attends religious service: Not on file    Active member of club or organization: Not on file    Attends meetings of clubs or organizations: Not on file    Relationship status: Not on file  . Intimate partner violence:    Fear of current or ex partner: Not on file    Emotionally abused: Not on file    Physically abused: Not on file    Forced sexual activity: Not on file  Other Topics Concern  . Not on file  Social History Narrative   Drinks 2- 50oz pepsi a day    Family History  Problem Relation Age of Onset  . Liver disease Mother        fatty liver, s/p liver biopsy  . Alcohol abuse Mother   . Hypertension Maternal Grandmother   . Glaucoma Maternal Grandmother   . Diabetes Maternal Grandfather   . Heart disease Maternal Grandfather   . Breast cancer Neg Hx   . Ovarian cancer Neg Hx   . Colon cancer Neg Hx     The following portions of the patient's history were reviewed and updated as appropriate: allergies, current medications, past family history, past medical history, past social history, past surgical history and problem list.  Review of Systems Review of Systems -Review of Systems  Constitutional: Negative for chills, diaphoresis and fever.  Respiratory: Negative.   Cardiovascular: Negative.   Gastrointestinal: Negative.   Genitourinary: Negative.        History of irregular menstrual cycles, controlled with OCPs  Musculoskeletal: Negative.        Left lower quadrant abdominal pain  Skin: Negative.   Neurological: Negative.   Endo/Heme/Allergies: Negative.   Psychiatric/Behavioral: Negative.      Objective:   BP 95/63   Pulse 69   Ht 5\' 6"  (1.676 m)   Wt 245 lb 12.8 oz (111.5 kg)   LMP 06/03/2018 (Exact Date)   BMI 39.67 kg/m  CONSTITUTIONAL: Well-developed, well-nourished female in no acute  distress.  HENT:  Normocephalic, atraumatic.  NECK: Normal range of motion, supple, no masses.  Normal thyroid.  SKIN: Skin is warm and dry. No rash noted. Not diaphoretic. No erythema. No pallor. [place]: Alert and oriented to person, place, and time. PSYCHIATRIC: Normal mood and affect. Normal behavior. Normal judgment and thought content. CARDIOVASCULAR:Not Examined RESPIRATORY: Not Examined BREASTS: Not Examined BACK: No CVA tenderness ABDOMEN: Soft, non distended; Non tender.  No Organomegaly. PELVIC:  External Genitalia: Normal  BUS: Normal  Vagina: Normal; good vault support; no discharge  Cervix: Normal; nulliparous; minimal blood loss; no cervical motion tenderness  Uterus: Normal size, shape,consistency, mobile, midplane  Adnexa: Normal; nonpalpable nontender  RV: Normal external exam  Bladder: Nontender MUSCULOSKELETAL: Normal range of motion. No tenderness.  No cyanosis, clubbing, or edema.     Assessment:   1. Left lower quadrant pain, cyclic, at times incapacitating - US PELVIS (TRANSABDOMINAL ONLY); Future - US PELVIS TRANSVANGINAL NON-OB (TV ONLY); Future  2. Uterine leiomyoma, unspecified location, 10 mm; Not likely cause of pelvic pain  3. PCO (polycystic ovaries)  4. Morbid obesity ([place])   5.  Irregular menstrual cycles, history of   Plan:   1.  Pelvic ultrasound 2.  Continue with cyclic OCP therapy 3.  Return in 2 weeks for follow-up and further management planning 4.  Laparoscopy will be considered to rule out source of cyclic left lower quadrant pain which may not be identifiable on pelvic ultrasound, including atypical endometriosis or pelvic adhesive disease 5.  Medical options of management of this pelvic pain were reviewed including consideration of continuous OCP therapy.  A total of 30 minutes were spent face-to-face with the patient during the encounter with greater than 50% dealing with counseling and coordination of care.  Brayton Mars, MD  Note: This dictation was prepared with Dragon dictation along with smaller phrase technology. Any transcriptional errors that result from this process are unintentional.

## 2018-06-14 NOTE — Patient Instructions (Addendum)
1.  Pelvic ultrasound is scheduled 2.  Return in 2 weeks for follow-up and further management planning 3.  Laparoscopy may be considered for further investigation of left lower quadrant pelvic pain 4.  Continue taking birth control pills as written  Diagnostic Laparoscopy A diagnostic laparoscopy is a procedure to diagnose diseases in the abdomen. During the procedure, a thin, lighted, pencil-sized instrument called a laparoscope is inserted into the abdomen through an incision. The laparoscope allows your health care provider to look at the organs inside your body. Tell a health care provider about:  Any allergies you have.  All medicines you are taking, including vitamins, herbs, eye drops, creams, and over-the-counter medicines.  Any problems you or family members have had with anesthetic medicines.  Any blood disorders you have.  Any surgeries you have had.  Any medical conditions you have. What are the risks? Generally, this is a safe procedure. However, problems can occur, which may include:  Infection.  Bleeding.  Damage to other organs.  Allergic reaction to the anesthetics used during the procedure.  What happens before the procedure?  Do not eat or drink anything after midnight on the night before the procedure or as directed by your health care provider.  Ask your health care provider about: ? Changing or stopping your regular medicines. ? Taking medicines such as aspirin and ibuprofen. These medicines can thin your blood. Do not take these medicines before your procedure if your health care provider instructs you not to.  Plan to have someone take you home after the procedure. What happens during the procedure?  You may be given a medicine to help you relax (sedative).  You will be given a medicine to make you sleep (general anesthetic).  Your abdomen will be inflated with a gas. This will make your organs easier to see.  Small incisions will be made in  your abdomen.  A laparoscope and other small instruments will be inserted into the abdomen through the incisions.  A tissue sample may be removed from an organ in the abdomen for examination.  The instruments will be removed from the abdomen.  The gas will be released.  The incisions will be closed with stitches (sutures). What happens after the procedure? Your blood pressure, heart rate, breathing rate, and blood oxygen level will be monitored often until the medicines you were given have worn off. This information is not intended to replace advice given to you by your health care provider. Make sure you discuss any questions you have with your health care provider. Document Released: 03/20/2001 Document Revised: 04/21/2016 Document Reviewed: 07/25/2014 Elsevier Interactive Patient Education  [employer].

## 2018-06-15 ENCOUNTER — Ambulatory Visit (INDEPENDENT_AMBULATORY_CARE_PROVIDER_SITE_OTHER): Payer: 59

## 2018-06-15 DIAGNOSIS — D259 Leiomyoma of uterus, unspecified: Secondary | ICD-10-CM

## 2018-06-15 DIAGNOSIS — Z6839 Body mass index (BMI) 39.0-39.9, adult: Secondary | ICD-10-CM | POA: Diagnosis not present

## 2018-06-15 DIAGNOSIS — R1032 Left lower quadrant pain: Secondary | ICD-10-CM

## 2018-06-15 DIAGNOSIS — E282 Polycystic ovarian syndrome: Secondary | ICD-10-CM

## 2018-06-19 ENCOUNTER — Ambulatory Visit (INDEPENDENT_AMBULATORY_CARE_PROVIDER_SITE_OTHER): Payer: 59 | Admitting: Family Medicine

## 2018-06-19 ENCOUNTER — Encounter: Payer: Self-pay | Admitting: Family Medicine

## 2018-06-19 VITALS — BP 122/84 | HR 94 | Temp 97.9°F | Resp 14 | Ht 66.0 in | Wt 245.9 lb

## 2018-06-19 DIAGNOSIS — R718 Other abnormality of red blood cells: Secondary | ICD-10-CM | POA: Diagnosis not present

## 2018-06-19 DIAGNOSIS — E559 Vitamin D deficiency, unspecified: Secondary | ICD-10-CM

## 2018-06-19 DIAGNOSIS — E781 Pure hyperglyceridemia: Secondary | ICD-10-CM | POA: Diagnosis not present

## 2018-06-19 DIAGNOSIS — M79675 Pain in left toe(s): Secondary | ICD-10-CM | POA: Diagnosis not present

## 2018-06-19 DIAGNOSIS — R7301 Impaired fasting glucose: Secondary | ICD-10-CM

## 2018-06-19 DIAGNOSIS — E669 Obesity, unspecified: Secondary | ICD-10-CM | POA: Diagnosis not present

## 2018-06-19 DIAGNOSIS — R932 Abnormal findings on diagnostic imaging of liver and biliary tract: Secondary | ICD-10-CM

## 2018-06-19 MED ORDER — RANITIDINE HCL 150 MG PO TABS
150.0000 mg | ORAL_TABLET | Freq: Two times a day (BID) | ORAL | 0 refills | Status: DC
Start: 2018-06-19 — End: 2018-07-18

## 2018-06-19 MED ORDER — NAPROXEN 375 MG PO TBEC: DELAYED_RELEASE_TABLET | ORAL | 0 refills | Status: DC

## 2018-06-19 NOTE — Assessment & Plan Note (Signed)
Check CBC 

## 2018-06-19 NOTE — Assessment & Plan Note (Signed)
Check labs today, almost nearly fasting

## 2018-06-19 NOTE — Assessment & Plan Note (Signed)
Check vit D 

## 2018-06-19 NOTE — Patient Instructions (Signed)
We'll check labs today Use the new medicines If you develop any stomach upset, stop the naproxen and seek medical attention right away Have the xrays done across the street If not getting better, we can have you see a podiatrist

## 2018-06-19 NOTE — Progress Notes (Signed)
BP 122/84   Pulse 94   Temp 97.9 F (36.6 C) (Oral)   Resp 14   Ht 5\' 6"  (1.676 m)   Wt 245 lb 14.4 oz (111.5 kg)   LMP 06/03/2018 (Exact Date)   SpO2 93%   BMI 39.69 kg/m    Subjective:    Patient ID: Connie Sims, female    DOB: 10/12/1981, 37 y.o.   MRN: [id number]  HPI: Connie Sims is a 37 y.o. female  Chief Complaint  Patient presents with  . 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; they stand on concrete, but not just standing in one spot; has a few ergo mats Hurts to walk on it; walks differently to compensate and feeling more pain travel up the leg She has not really tried anything; just limiting time in the work shoes; off on Thursday and Friday and so foot doesn't hurt now She can feel it in bed when she moves it, pain lingers Sharp pain, but not stabbing; pain might last for 3-4 seconds at a time; changes how she walks She has taken an NSAID in the past, mild discomfort but no bleeding  Prediabetes; last A1c was 6; trying to cut carbs  Obesity; she wants something simple; she would be willing to see nutritionist; has not been drinking enough water  Microcytosis; taking vitamin B12; vit D deficiency, just started back  Depression screen [facility] 2/9 06/19/2018 02/15/2018 06/08/2017 08/17/2016  Decreased Interest 0 0 0 0  Down, Depressed, Hopeless 0 0 0 0  PHQ - 2 Score 0 0 0 0    Relevant past medical, surgical, family and social history reviewed Past Medical History:  Diagnosis Date  . Allergic rhinitis   . Allergic rhinitis with postnasal drip   . Congenital osteopetrosis    previously managed by specialist at [facility],  periodic hearing tests  . Diabetes mellitus without complication ([place])   . Epigastric pain   . Foot fracture, right    Jones fx 5th metatarsal  . Impaired fasting glucose   . Insomnia   . Low back pain   . LUQ pain   . Microcytosis   . Morbid obesity with BMI of 40.0-44.9, adult ([place])   . PCOS (polycystic ovarian syndrome)   . Trochanteric bursitis of right hip    Past Surgical History:  Procedure Laterality Date  . NASAL SINUS SURGERY     Family History  Problem Relation Age of Onset  . Liver disease Mother        fatty liver, s/p liver biopsy  . Alcohol abuse Mother   . Hypertension Maternal Grandmother   . Glaucoma Maternal Grandmother   . Diabetes Maternal Grandfather   . Heart disease Maternal Grandfather   . Breast cancer Neg Hx   . Ovarian cancer Neg Hx   . Colon cancer Neg Hx    Social History   Tobacco Use  . Smoking status: Never Smoker  . Smokeless tobacco: Never Used  Substance Use Topics  . Alcohol use: No    Alcohol/week: 0.0 oz  . Drug use:  No    Interim medical history since last visit reviewed. Allergies and medications reviewed  Review of Systems Per HPI unless specifically indicated above     Objective:    BP 122/84   Pulse 94   Temp 97.9 F (36.6 C) (Oral)   Resp 14   Ht 5\' 6"  (1.676 m)   Wt 245 lb 14.4 oz (111.5 kg)   LMP 06/03/2018 (Exact Date)   SpO2 93%   BMI 39.69 kg/m   Wt Readings from Last 3 Encounters:  06/19/18 245 lb 14.4 oz (111.5 kg)  06/14/18 245 lb 12.8 oz (111.5 kg)  02/15/18 239 lb (108.4 kg)    Physical Exam  Constitutional: She appears well-developed and well-nourished.  HENT:  Mouth/Throat: Mucous membranes are normal.  Eyes: EOM are normal. No scleral icterus.  Cardiovascular: Normal rate and regular rhythm.  Pulses:      Dorsalis pedis pulses are 2+ on the right side, and 2+ on the left side.  Pulmonary/Chest: Effort normal and breath sounds normal.  Musculoskeletal:       Right foot: There is  normal range of motion and no deformity.       Left foot: There is tenderness (flexor/plantar aspect of the LEFT great toe). There is normal range of motion and no deformity.       Feet:  Feet:  Right Foot:  Protective Sensation: 5 sites tested. 5 sites sensed.  Skin Integrity: Negative for ulcer, skin breakdown, erythema or callus.  Left Foot:  Protective Sensation: 5 sites tested. 5 sites sensed.  Skin Integrity: Negative for ulcer, skin breakdown, erythema or callus.  Skin: No pallor.  Psychiatric: She has a normal mood and affect. Her behavior is normal. Her mood appears not anxious. She does not exhibit a depressed mood.    Results for orders placed or performed in visit on 07/04/17  IGP, Aptima HPV  Result Value Ref Range   DIAGNOSIS: Comment    Specimen adequacy: Comment    Clinician Provided ICD10 Comment    Performed by: Comment    PAP Smear Comment .    Note: Comment    Test Methodology Comment    HPV Aptima Negative Negative      Assessment & Plan:   Problem List Items Addressed This Visit      Endocrine   Impaired fasting glucose    Check glucose and A1c; chewed gum, otherwise fasting      Relevant Orders   Hemoglobin A1c   Amb ref to Medical Nutrition Therapy-MNT     Other   Vitamin D deficiency    Check vit D      Relevant Orders   VITAMIN D 25 Hydroxy (Vit-D Deficiency, Fractures)   Obesity (BMI 35.0-39.9 without comorbidity)    Encouraged weight loss; I can refer to dietician; consider medicine to help with weight loss, pending A1c; not tolerating metformin well, so could consider SGLT-2 inhibitor or GLP-1 agonist      Relevant Orders   Amb ref to Medical Nutrition Therapy-MNT   Microcytosis    Check CBC      Relevant Orders   CBC with Differential/Platelet   Hypertriglyceridemia    Check labs today, almost nearly fasting      Relevant Orders   Lipid panel   Highly echogenic liver on ultrasound    Check LFTs      Relevant Orders    COMPLETE METABOLIC PANEL WITH GFR    Other Visit Diagnoses  Toe pain, left    -  Primary   discussed options, conservative measures, cons measure + xray, referral; she opted for cons meas and xray for now; NSAID plus H2 blocker   Relevant Orders   DG Toe Great Left      Follow up plan: No follow-ups on file.  An after-visit summary was printed and given to the patient at [facility].  Please see the patient instructions which may contain other information and recommendations beyond what is mentioned above in the assessment and plan.  Meds ordered this encounter  Medications  . ranitidine (ZANTAC) 150 MG tablet    Sig: Take 1 tablet (150 mg total) by mouth 2 (two) times daily.    Dispense:  60 tablet    Refill:  0  . Naproxen (EC-NAPROXEN) 375 MG TBEC    Sig: One by mouth every 12 hours if needed for toe pain    Dispense:  60 each    Refill:  0    Orders Placed This Encounter  Procedures  . DG Toe Great Left  . COMPLETE METABOLIC PANEL WITH GFR  . CBC with Differential/Platelet  . Hemoglobin A1c  . Lipid panel  . VITAMIN D 25 Hydroxy (Vit-D Deficiency, Fractures)  . Amb ref to Medical Nutrition Therapy-MNT

## 2018-06-19 NOTE — Assessment & Plan Note (Signed)
Check LFTs 

## 2018-06-19 NOTE — Assessment & Plan Note (Addendum)
Encouraged weight loss; I can refer to dietician; consider medicine to help with weight loss, pending A1c; not tolerating metformin well, so could consider SGLT-2 inhibitor or GLP-1 agonist

## 2018-06-19 NOTE — Assessment & Plan Note (Signed)
Check glucose and A1c; chewed gum, otherwise fasting

## 2018-06-20 ENCOUNTER — Other Ambulatory Visit: Payer: Self-pay | Admitting: Family Medicine

## 2018-06-20 LAB — CBC WITH DIFFERENTIAL/PLATELET
Basophils Absolute: 52 cells/uL (ref 0–200)
Basophils Relative: 0.6 %
Eosinophils Absolute: 270 cells/uL (ref 15–500)
Eosinophils Relative: 3.1 %
HCT: 37.2 % (ref 35.0–45.0)
Hemoglobin: 12.3 g/dL (ref 11.7–15.5)
Lymphs Abs: 1879 cells/uL (ref 850–3900)
MCH: 25.7 pg — ABNORMAL LOW (ref 27.0–33.0)
MCHC: 33.1 g/dL (ref 32.0–36.0)
MCV: 77.7 fL — ABNORMAL LOW (ref 80.0–100.0)
MPV: 9.7 fL (ref 7.5–12.5)
Monocytes Relative: 5.6 %
Neutro Abs: 6012 cells/uL (ref 1500–7800)
Neutrophils Relative %: 69.1 %
Platelets: 344 10*3/uL (ref 140–400)
RBC: 4.79 10*6/uL (ref 3.80–5.10)
RDW: 15.7 % — ABNORMAL HIGH (ref 11.0–15.0)
Total Lymphocyte: 21.6 %
WBC mixed population: 487 cells/uL (ref 200–950)
WBC: 8.7 10*3/uL (ref 3.8–10.8)

## 2018-06-20 LAB — LIPID PANEL
Cholesterol: 178 mg/dL (ref <200)
HDL: 43 mg/dL — ABNORMAL LOW (ref >50)
LDL Cholesterol (Calc): 106 mg/dL (calc) — ABNORMAL HIGH
Non-HDL Cholesterol (Calc): 135 mg/dL (calc) — ABNORMAL HIGH (ref <130)
Total CHOL/HDL Ratio: 4.1 (calc) (ref <5.0)
Triglycerides: 169 mg/dL — ABNORMAL HIGH (ref <150)

## 2018-06-20 LAB — VITAMIN D 25 HYDROXY (VIT D DEFICIENCY, FRACTURES): Vit D, 25-Hydroxy: 18 ng/mL — ABNORMAL LOW (ref 30–100)

## 2018-06-20 LAB — COMPLETE METABOLIC PANEL WITH GFR
AG Ratio: 1.5 (calc) (ref 1.0–2.5)
ALT: 20 U/L (ref 6–29)
AST: 14 U/L (ref 10–30)
Albumin: 4.3 g/dL (ref 3.6–5.1)
Alkaline phosphatase (APISO): 97 U/L (ref 33–115)
BUN: 16 mg/dL (ref 7–25)
CO2: 27 mmol/L (ref 20–32)
Calcium: 9.4 mg/dL (ref 8.6–10.2)
Chloride: 102 mmol/L (ref 98–110)
Creat: 0.8 mg/dL (ref 0.50–1.10)
GFR, Est African American: 108 mL/min/{1.73_m2} (ref >=60)
GFR, Est Non African American: 94 mL/min/{1.73_m2} (ref >=60)
Globulin: 2.9 g/dL (calc) (ref 1.9–3.7)
Glucose, Bld: 94 mg/dL (ref 65–99)
Potassium: 4.4 mmol/L (ref 3.5–5.3)
Sodium: 138 mmol/L (ref 135–146)
Total Bilirubin: 0.3 mg/dL (ref 0.2–1.2)
Total Protein: 7.2 g/dL (ref 6.1–8.1)

## 2018-06-20 LAB — HEMOGLOBIN A1C
Hgb A1c MFr Bld: 5.6 % of total Hgb (ref <5.7)
Mean Plasma Glucose: 114 (calc)
eAG (mmol/L): 6.3 (calc)

## 2018-06-20 MED ORDER — VITAMIN D (ERGOCALCIFEROL) 1.25 MG (50000 UNIT) PO CAPS: 50000.0000 [IU] | ORAL_CAPSULE | ORAL | 1 refills | Status: AC

## 2018-06-20 NOTE — Progress Notes (Signed)
Vit D Rx °

## 2018-06-21 ENCOUNTER — Encounter: Payer: Self-pay | Admitting: General Surgery

## 2018-06-21 ENCOUNTER — Ambulatory Visit (INDEPENDENT_AMBULATORY_CARE_PROVIDER_SITE_OTHER): Payer: 59 | Admitting: General Surgery

## 2018-06-21 VITALS — BP 134/72 | HR 79 | Ht 66.0 in | Wt 244.0 lb

## 2018-06-21 DIAGNOSIS — Z1239 Encounter for other screening for malignant neoplasm of breast: Secondary | ICD-10-CM

## 2018-06-21 DIAGNOSIS — Z1231 Encounter for screening mammogram for malignant neoplasm of breast: Secondary | ICD-10-CM

## 2018-06-21 NOTE — Progress Notes (Signed)
Patient ID: Connie Sims, female   DOB: 05-Jan-1981, 37 y.o.   MRN: [id number]  Chief Complaint  Patient presents with  . Breast Problem    HPI Connie Sims is a 37 y.o. female.  who presents for a breast evaluation referred by Dr Sanda Klein.  She thought she felt a knot in both arm pits around February. The lumps were about grape in size. She states she originally noticed a pimple.  Denies any pain, injury or trauma.The most recent mammogram was done on 03-29-18. Bilateral ultrasounds were 06-15-18. Patient does not perform regular self breast checks but she is trying to improve.   She works for [employer] and grew up in [place].  HPI  Past Medical History:  Diagnosis Date  . Allergic rhinitis   . Allergic rhinitis with postnasal drip   . Congenital osteopetrosis    previously managed by specialist at [facility], periodic hearing tests  . Diabetes mellitus without complication ([place])   . Epigastric pain   . Foot fracture, right    Jones fx 5th metatarsal  . Impaired fasting glucose   . Insomnia   . Low back pain   . LUQ pain   . Microcytosis   . Morbid obesity with BMI of 40.0-44.9, adult ([place])   . PCOS (polycystic ovarian syndrome)   . Trochanteric bursitis of right hip     Past Surgical History:  Procedure Laterality Date  . NASAL SINUS SURGERY      Family History  Problem Relation Age of Onset  . Liver disease Mother        fatty liver, s/p liver biopsy  . Alcohol abuse Mother   . Hypertension Maternal Grandmother   . Glaucoma Maternal Grandmother   . Diabetes Maternal Grandfather   . Heart disease Maternal Grandfather   . Breast cancer Neg Hx   . Ovarian cancer Neg Hx   . Colon cancer Neg Hx     Social History Social History   Tobacco Use  . Smoking status: Never Smoker  . Smokeless tobacco: Never Used  Substance Use Topics  . Alcohol use: No    Alcohol/week: 0.0 oz  . Drug use: No    No Known Allergies  Current Outpatient Medications   Medication Sig Dispense Refill  . Cholecalciferol (VITAMIN D-3) 1000 units CAPS Take 1,000 capsules by mouth daily.    . cyclobenzaprine (FLEXERIL) 5 MG tablet TAKE 1 TABLET (5 MG TOTAL) BY MOUTH AT BEDTIME. PRN MUSCLE SPASMS 30 tablet 1  . Fe Bisgly-Succ-C-Thre-B12-FA (IRON-150 PO) Take by mouth.    . Naproxen (EC-NAPROXEN) 375 MG TBEC One by mouth every 12 hours if needed for toe pain 60 each 0  . norgestimate-ethinyl estradiol (ORTHO-CYCLEN,SPRINTEC,PREVIFEM) 0.25-35 MG-MCG tablet Take 1 tablet by mouth daily. 1 Package 11  . ranitidine (ZANTAC) 150 MG tablet Take 1 tablet (150 mg total) by mouth 2 (two) times daily. 60 tablet 0  . Vitamin D, Ergocalciferol, (DRISDOL) 50000 units CAPS capsule Take 1 capsule (50,000 Units total) by mouth every 7 (seven) days. 4 capsule 1   No current facility-administered medications for this visit.     Review of Systems Review of Systems  Constitutional: Negative.   Respiratory: Negative.   Cardiovascular: Negative.     Blood pressure 134/72, pulse 79, height 5\' 6"  (1.676 m), weight 244 lb (110.7 kg), last menstrual period 06/03/2018.  Physical Exam Physical Exam  Constitutional: She is oriented to person, place, and time. She appears well-developed and  well-nourished.  HENT:  Mouth/Throat: Oropharynx is clear and moist.  Eyes: Conjunctivae are normal. No scleral icterus.  Neck: Neck supple.  Cardiovascular: Normal rate, regular rhythm and normal heart sounds.  Pulmonary/Chest: Effort normal and breath sounds normal. Right breast exhibits no inverted nipple, no mass, no nipple discharge, no skin change and no tenderness. Left breast exhibits no inverted nipple, no mass, no nipple discharge, no skin change and no tenderness.  Lymphadenopathy:    She has no cervical adenopathy.    She has no axillary adenopathy.  Neurological: She is alert and oriented to person, place, and time.  Skin: Skin is warm and dry.  Psychiatric: Her behavior is normal.     Data Reviewed PCP notes. March 29, 2018 bilateral diagnostic mammogram and bilateral ultrasound reviewed.  BI-RADS-1.  Assessment    Benign exam of the breast and axilla.  No evidence of lymphadenopathy.    Plan    Follow up as needed or if symptoms worsen or change. The patient is aware to call back for any questions or new concerns.      HPI, Physical Exam, Assessment and Plan have been scribed under the direction and in the presence of Robert Bellow, MD. Karie Fetch, RN  I have completed the exam and reviewed the above documentation for accuracy and completeness.  I agree with the above.  Haematologist has been used and any errors in dictation or transcription are unintentional.  Hervey Ard, M.D., F.A.C.S.  Forest Gleason Dreshawn Hendershott 06/22/2018, 8:01 PM

## 2018-06-21 NOTE — Patient Instructions (Addendum)
The patient is aware to call back for any questions or concerns.  Breast Self-Awareness Breast self-awareness means:  Knowing how your breasts look.  Knowing how your breasts feel.  Checking your breasts every month for changes.  Telling your doctor if you notice a change in your breasts.  Breast self-awareness allows you to notice a breast problem early while it is still small. How to do a breast self-exam One way to learn what is normal for your breasts and to check for changes is to do a breast self-exam. To do a breast self-exam: Look for Changes  1. Take off all the clothes above your waist. 2. Stand in front of a mirror in a room with good lighting. 3. Put your hands on your hips. 4. Push your hands down. 5. Look at your breasts and nipples in the mirror to see if one breast or nipple looks different than the other. Check to see if: ? The shape of one breast is different. ? The size of one breast is different. ? There are wrinkles, dips, and bumps in one breast and not the other. 6. Look at each breast for changes in your skin, such as: ? Redness. ? Scaly areas. 7. Look for changes in your nipples, such as: ? Liquid around the nipples. ? Bleeding. ? Dimpling. ? Redness. ? A change in where the nipples are. Feel for Changes 1. Lie on your back on the floor. 2. Feel each breast. To do this, follow these steps: ? Pick a breast to feel. ? Put the arm closest to that breast above your head. ? Use your other arm to feel the nipple area of your breast. Feel the area with the pads of your three middle fingers by making small circles with your fingers. For the first circle, press lightly. For the second circle, press harder. For the third circle, press even harder. ? Keep making circles with your fingers at the light, harder, and even harder pressures as you move down your breast. Stop when you feel your ribs. ? Move your fingers a little toward the center of your  body. ? Start making circles with your fingers again, this time going up until you reach your collarbone. ? Keep making up and down circles until you reach your armpit. Remember to keep using the three pressures. ? Feel the other breast in the same way. 3. Sit or stand in the shower or tub. 4. With soapy water on your skin, feel each breast the same way you did in step 2, when you were lying on the floor. Write Down What You Find  After doing the self-exam, write down:  What is normal for each breast.  Any changes you find in each breast.  When you last had your period.  How often should I check my breasts? Check your breasts every month. If you are breastfeeding, the best time to check them is after you feed your baby or after you use a breast pump. If you get periods, the best time to check your breasts is 5-7 days after your period is over. When should I see my doctor? See your doctor if you notice:  A change in shape or size of your breasts or nipples.  A change in the skin of your breast or nipples, such as red or scaly skin.  Unusual fluid coming from your nipples.  A lump or thick area that was not there before.  Pain in your breasts.  Anything that  concerns you.  This information is not intended to replace advice given to you by your health care provider. Make sure you discuss any questions you have with your health care provider. Document Released: 05/30/2008 Document Revised: 05/19/2016 Document Reviewed: 11/01/2015 Elsevier Interactive Patient Education  [employer].

## 2018-06-27 ENCOUNTER — Encounter: Payer: Self-pay | Admitting: Obstetrics and Gynecology

## 2018-06-27 ENCOUNTER — Ambulatory Visit (INDEPENDENT_AMBULATORY_CARE_PROVIDER_SITE_OTHER): Payer: 59 | Admitting: Obstetrics and Gynecology

## 2018-06-27 VITALS — BP 95/62 | HR 78 | Ht 66.0 in | Wt 244.6 lb

## 2018-06-27 DIAGNOSIS — R1032 Left lower quadrant pain: Secondary | ICD-10-CM | POA: Diagnosis not present

## 2018-06-27 DIAGNOSIS — N83201 Unspecified ovarian cyst, right side: Secondary | ICD-10-CM | POA: Insufficient documentation

## 2018-06-27 DIAGNOSIS — Z3041 Encounter for surveillance of contraceptive pills: Secondary | ICD-10-CM | POA: Insufficient documentation

## 2018-06-27 MED ORDER — NORGESTIMATE-ETH ESTRADIOL 0.25-35 MG-MCG PO TABS: ORAL_TABLET | ORAL | 3 refills | Status: DC

## 2018-06-27 NOTE — Patient Instructions (Signed)
1.  Ultrasound demonstrated a dominant follicle in the right ovary measuring 2.3 cm.  Previously identified fibroid was not visualized.  Essentially this is a normal ultrasound. 2.  Begin taking birth control pills in and 84/7 regimen so as to have only 4 cycles per year.  Prescription will be modified in order to get 4 packs at a time. 3.  Return in 4 months for follow-up

## 2018-06-27 NOTE — Progress Notes (Signed)
Chief complaint: 1.  Follow-up on ultrasound 2.  History of uterine fibroid 3.  Left lower quadrant pain, cyclic  Chronic presents for follow-up.  06/15/2018 ultrasound demonstrates normal findings in the pelvis without evidence of fibroid previously noted.  There was a dominant follicle in the right ovary measuring 2.3 cm.  These findings were explained and patient understands that we do not have an answer for her symptoms based on ultrasound.  Symptoms of left lower quadrant and low back pain continue in a cyclic manner every month. Options of management including other medical therapies for suspected endometriosis, continuous OCP therapy, and surgical work-up to rule out endometriosis are reviewed.  Patient desires to try continuous OCP therapy.  Past medical history: Past surgical history, problem list, medications, and allergies are reviewed  OBJECTIVE: BP 95/62   Pulse 78   Ht 5\' 6"  (1.676 m)   Wt 244 lb 9.6 oz (110.9 kg)   LMP 06/03/2018 (Exact Date)   BMI 39.48 kg/m  Physical exam-deferred  ASSESSMENT: 1.  Cyclic left lower quadrant pain, unexplained 2.  Possible atypical endometriosis 3.  No evidence of fibroid on most recent ultrasound 4.  Dominant follicle of right ovary is noted on ultrasound 5.  Patient desires empiric therapy for endometriosis in the form of alternative OCP regimen  PLAN: 1.  Begin birth control pills in and 84/7 regimen-modify prescription sent in. 2.  Maintain menstrual calendar monitoring and pelvic pain monitoring 3.  Return in 4 months for follow-up  A total of 15 minutes were spent face-to-face with the patient during this encounter and over half of that time dealt with counseling and coordination of care.  Brayton Mars, MD  Note: This dictation was prepared with Dragon dictation along with smaller phrase technology. Any transcriptional errors that result from this process are unintentional.

## 2018-07-01 ENCOUNTER — Other Ambulatory Visit: Payer: Self-pay | Admitting: Advanced Practice Midwife

## 2018-07-01 DIAGNOSIS — Z3041 Encounter for surveillance of contraceptive pills: Secondary | ICD-10-CM

## 2018-07-11 ENCOUNTER — Encounter: Payer: Self-pay | Admitting: Family Medicine

## 2018-07-11 ENCOUNTER — Other Ambulatory Visit: Payer: Self-pay | Admitting: Family Medicine

## 2018-07-11 MED ORDER — METFORMIN HCL ER 500 MG PO TB24
500.0000 mg | ORAL_TABLET | Freq: Every day | ORAL | 3 refills | Status: DC
Start: 2018-07-11 — End: 2019-01-08

## 2018-07-15 ENCOUNTER — Encounter: Payer: Self-pay | Admitting: Obstetrics and Gynecology

## 2018-07-16 ENCOUNTER — Other Ambulatory Visit: Payer: Self-pay

## 2018-07-16 ENCOUNTER — Other Ambulatory Visit: Payer: Self-pay | Admitting: *Deleted

## 2018-07-16 DIAGNOSIS — Z3041 Encounter for surveillance of contraceptive pills: Secondary | ICD-10-CM

## 2018-07-16 MED ORDER — NORGESTIMATE-ETH ESTRADIOL 0.25-35 MG-MCG PO TABS: ORAL_TABLET | ORAL | 3 refills | Status: DC

## 2018-07-17 ENCOUNTER — Other Ambulatory Visit: Payer: Self-pay | Admitting: Family Medicine

## 2018-07-18 ENCOUNTER — Other Ambulatory Visit: Payer: Self-pay | Admitting: Family Medicine

## 2018-07-23 ENCOUNTER — Encounter: Payer: 59 | Attending: Family Medicine | Admitting: Dietician

## 2018-07-23 ENCOUNTER — Encounter: Payer: Self-pay | Admitting: Dietician

## 2018-07-23 VITALS — Ht 65.0 in | Wt 249.2 lb

## 2018-07-23 DIAGNOSIS — E669 Obesity, unspecified: Secondary | ICD-10-CM | POA: Insufficient documentation

## 2018-07-23 DIAGNOSIS — R7301 Impaired fasting glucose: Secondary | ICD-10-CM | POA: Diagnosis not present

## 2018-07-23 DIAGNOSIS — Z713 Dietary counseling and surveillance: Secondary | ICD-10-CM | POA: Insufficient documentation

## 2018-07-23 DIAGNOSIS — Z6841 Body Mass Index (BMI) 40.0 and over, adult: Secondary | ICD-10-CM | POA: Insufficient documentation

## 2018-07-23 NOTE — Patient Instructions (Signed)
   Control food portions, especially starches and some meats. Include generous portions of low-carb veggies and moderate portions of fruits.   Plan to eat something every 4 hours during the "eating" part of your day.   Limit the amount of sugar from sodas.

## 2018-07-23 NOTE — Progress Notes (Signed)
Medical Nutrition Therapy: Visit start time: 4742  end time: 1145  Assessment:  Diagnosis: Impaired fasting glucose, obesity Past medical history: PCOS, osteoporosis per patient report Psychosocial issues/ stress concerns: none  Preferred learning method:  . Hands-on   Current weight: 249.2lbs Height: 5'5" Medications, supplements: reconciled list in medical record  Progress and evaluation: Patient reports diagnosis of PCOS 6-8 years ago, takes Metformin.  She feels she has gained some weight recently with some eating out of boredom, and likely from drinking regular sodas. She has lost weight in the past by engaging in an intermittent fasting pattern. She works second shift, and reports a challenge in bringing all of her meals to work, particularly when following an 8-hour eating plan. Patient reports multiple food intolerances for which she has been tested; she reports a history of gas, bloating, and diarrhea, but symptoms have improved with avoiding significant intake of wheat, [place], onions, and spicy foods.   Physical activity: no structured activity at this time; accumulates 10,000steps regularly at work  Dietary Intake:  Usual eating pattern includes 2 meals and 1-2 snacks per day. Dining out frequency: 4 meals per week.  Breakfast: 11-1pm  Snack: none Lunch: burger or taco bell (was eating 2eggs and apple with pb) Snack: pkg nuts if hungry Supper: 6pm at work; meat and veg.  Snack: 9pm cottage cheese and fruit Beverages: water, sodas, was drinking detox tea at night after work  Nutrition Care Education: Topics covered: weight control, diabetes prevention Basic nutrition: basic food groups, appropriate nutrient balance, appropriate meal and snack schedule, general nutrition guidelines    Weight control: controlling food portions, importance of low fat and low sugar foods while increasing vegetable and fruit portions; role of physical activity; guidance of daily intake for about  1700kcal for weight loss. Advanced nutrition: basic food label reading for carbohydrate Diabetes prevention:  goals for BGs, appropriate meal and snack schedule, appropriate carb intake and balance Other: discussed patient's food intolerances and options to avoid offending foods.   Nutritional Diagnosis:  [place]-2.2 Altered nutrition-related laboratory As related to PCOS, impaired fasting glucose.  As evidenced by HbA1C of 5.6% and Metformin treatment. -3.3 Overweight/obesity As related to excess calories, PCOS.  As evidenced by patient with BMI of 41.  Intervention: Instruction as noted above.   Patient would like to follow somewhat of an intermittent fasting pattern. Encouraged eating every 4 hours during "eating" time, and considering eating a small meal or snack before or after work to avoid bringing as many meals to work.    Set goals with input from patient, she states she is ready to decrease sodas and work on portion control.  Education Materials given:  . General diet guidelines for Diabetes . Plate Planner with food lists . Sample meal pattern/ menus . Goals/ instructions   Learner/ who was taught:  . Patient   Level of understanding: Marland Kitchen Verbalizes/ demonstrates competency   Demonstrated degree of understanding via:   Teach back Learning barriers: . None  Willingness to learn/ readiness for change: . Eager, change in progress   Monitoring and Evaluation:  Dietary intake, exercise, BG control, GI symptoms, and body weight      follow up: 08/14/18

## 2018-07-27 ENCOUNTER — Other Ambulatory Visit: Payer: Self-pay | Admitting: Family Medicine

## 2018-08-03 ENCOUNTER — Encounter: Payer: Self-pay | Admitting: Family Medicine

## 2018-08-03 DIAGNOSIS — Z113 Encounter for screening for infections with a predominantly sexual mode of transmission: Secondary | ICD-10-CM

## 2018-08-03 DIAGNOSIS — Z114 Encounter for screening for human immunodeficiency virus [HIV]: Secondary | ICD-10-CM

## 2018-08-09 ENCOUNTER — Other Ambulatory Visit: Payer: Self-pay

## 2018-08-09 DIAGNOSIS — Z113 Encounter for screening for infections with a predominantly sexual mode of transmission: Secondary | ICD-10-CM

## 2018-08-09 DIAGNOSIS — Z114 Encounter for screening for human immunodeficiency virus [HIV]: Secondary | ICD-10-CM

## 2018-08-10 ENCOUNTER — Other Ambulatory Visit: Payer: Self-pay | Admitting: Emergency Medicine

## 2018-08-10 DIAGNOSIS — Z113 Encounter for screening for infections with a predominantly sexual mode of transmission: Secondary | ICD-10-CM

## 2018-08-10 LAB — HEPATITIS PANEL, ACUTE
Hep A IgM: NONREACTIVE
Hep B C IgM: NONREACTIVE
Hepatitis B Surface Ag: NONREACTIVE
Hepatitis C Ab: NONREACTIVE
SIGNAL TO CUT-OFF: 0.01 (ref <1.00)

## 2018-08-10 LAB — RPR: RPR Ser Ql: NONREACTIVE

## 2018-08-10 LAB — HIV ANTIBODY (ROUTINE TESTING W REFLEX): HIV 1&2 Ab, 4th Generation: NONREACTIVE

## 2018-08-13 ENCOUNTER — Other Ambulatory Visit: Payer: Self-pay

## 2018-08-13 ENCOUNTER — Other Ambulatory Visit (HOSPITAL_COMMUNITY)
Admission: RE | Admit: 2018-08-13 | Discharge: 2018-08-13 | Disposition: A | Discharge status: Home / Self Care | Payer: 59 | Source: Ambulatory Visit | Attending: Family Medicine | Admitting: Family Medicine

## 2018-08-13 DIAGNOSIS — Z113 Encounter for screening for infections with a predominantly sexual mode of transmission: Secondary | ICD-10-CM

## 2018-08-14 ENCOUNTER — Ambulatory Visit: Payer: 59 | Admitting: Dietician

## 2018-08-14 LAB — CERVICOVAGINAL ANCILLARY ONLY
Chlamydia: NEGATIVE
Neisseria Gonorrhea: NEGATIVE
Trichomonas: NEGATIVE

## 2018-08-16 ENCOUNTER — Encounter: Payer: Self-pay | Admitting: Dietician

## 2018-08-16 ENCOUNTER — Other Ambulatory Visit: Payer: Self-pay | Admitting: Family Medicine

## 2018-08-16 NOTE — Progress Notes (Signed)
Patient cancelled her follow-up visit scheduled for 08/14/18, and did not want to reschedule. Sent letter to referring provider.

## 2018-09-17 ENCOUNTER — Other Ambulatory Visit: Payer: Self-pay | Admitting: Obstetrics and Gynecology

## 2018-09-17 DIAGNOSIS — Z3041 Encounter for surveillance of contraceptive pills: Secondary | ICD-10-CM

## 2018-09-19 ENCOUNTER — Encounter: Payer: Self-pay | Admitting: Family Medicine

## 2018-09-19 ENCOUNTER — Ambulatory Visit (INDEPENDENT_AMBULATORY_CARE_PROVIDER_SITE_OTHER): Payer: 59 | Admitting: Family Medicine

## 2018-09-19 VITALS — BP 118/74 | HR 90 | Temp 97.8°F | Resp 12 | Ht 66.0 in | Wt 250.9 lb

## 2018-09-19 DIAGNOSIS — L03314 Cellulitis of groin: Secondary | ICD-10-CM | POA: Diagnosis not present

## 2018-09-19 DIAGNOSIS — J0100 Acute maxillary sinusitis, unspecified: Secondary | ICD-10-CM | POA: Diagnosis not present

## 2018-09-19 MED ORDER — SULFAMETHOXAZOLE-TRIMETHOPRIM 800-160 MG PO TABS: 1.0000 | ORAL_TABLET | Freq: Two times a day (BID) | ORAL | 0 refills | Status: DC

## 2018-09-19 NOTE — Progress Notes (Signed)
BP 118/74 (BP Location: Left Arm, Patient Position: Sitting, Cuff Size: Normal)   Pulse 90   Temp 97.8 F (36.6 C) (Oral)   Resp 12   Ht 5\' 6"  (1.676 m)   Wt 250 lb 14.4 oz (113.8 kg)   SpO2 98%   BMI 40.50 kg/m    Subjective:    Patient ID: Connie Sims, female    DOB: 1981-01-16, 37 y.o.   MRN: [id number]  HPI: Connie Sims is a 37 y.o. female  Chief Complaint  Patient presents with  . Sinusitis    Sinus pressure, yellow, thick mucus   . Folliculitis    x 3 weeks it has been draining and has a knot inside.    HPI Sinusitis One week of nasal congestion, facial fullness, sinus headaches and pressure mainly on right maxillary. Has been neti pot with short-term relief. Denies fevers, chills, eye pain, cough. States some improvement now but feels it is bacterial due to lots of yellow drainage.   Folliculitis Red tender area at suprapubic area. States gets it a lot self treats with neosporin and band-aid usually self resolves. States this one has been going for over month, has been draining finally improving.    Depression screen [facility] 2/9 07/23/2018 06/19/2018 02/15/2018 06/08/2017 08/17/2016  Decreased Interest 0 0 0 0 0  Down, Depressed, Hopeless 0 0 0 0 0  PHQ - 2 Score 0 0 0 0 0   Fall Risk  09/19/2018 07/23/2018 06/19/2018 02/15/2018 06/08/2017  Falls in the past year? No No No No No    Relevant past medical, surgical, family and social history reviewed Past Medical History:  Diagnosis Date  . Allergic rhinitis   . Allergic rhinitis with postnasal drip   . Congenital osteopetrosis    previously managed by specialist at [facility], periodic hearing tests  . Diabetes mellitus without complication ([place])   . Epigastric pain   . Foot fracture, right    Jones fx 5th metatarsal  . Impaired fasting glucose   . Insomnia   . Low back pain   . LUQ pain   . Microcytosis   . Morbid obesity with BMI of 40.0-44.9, adult ([place])   . PCOS (polycystic ovarian syndrome)   .  Trochanteric bursitis of right hip    Past Surgical History:  Procedure Laterality Date  . NASAL SINUS SURGERY     Family History  Problem Relation Age of Onset  . Liver disease Mother        fatty liver, s/p liver biopsy  . Alcohol abuse Mother   . Hypertension Maternal Grandmother   . Glaucoma Maternal Grandmother   . Diabetes Maternal Grandfather   . Heart disease Maternal Grandfather   . Breast cancer Neg Hx   . Ovarian cancer Neg Hx   . Colon cancer Neg Hx    Social History   Tobacco Use  . Smoking status: Never Smoker  . Smokeless tobacco: Never Used  Substance Use Topics  . Alcohol use: No    Alcohol/week: 0.0 standard drinks  . Drug use: No     Office Visit from 06/19/2018 in [facility]  AUDIT-C Score  0      Interim medical history since last visit reviewed. Allergies and medications reviewed  Review of Systems Per HPI unless specifically indicated above     Objective:    BP 118/74 (BP Location: Left Arm, Patient Position: Sitting, Cuff Size: Normal)   Pulse 90  Temp 97.8 F (36.6 C) (Oral)   Resp 12   Ht 5\' 6"  (1.676 m)   Wt 250 lb 14.4 oz (113.8 kg)   SpO2 98%   BMI 40.50 kg/m   Wt Readings from Last 3 Encounters:  09/19/18 250 lb 14.4 oz (113.8 kg)  07/23/18 249 lb 3.2 oz (113 kg)  06/27/18 244 lb 9.6 oz (110.9 kg)    Physical Exam  Constitutional: She appears well-developed and well-nourished. No distress.  HENT:  Right Ear: External ear normal.  Left Ear: External ear normal.  Nose: Nose normal. Right sinus exhibits no maxillary sinus tenderness and no frontal sinus tenderness. Left sinus exhibits no maxillary sinus tenderness and no frontal sinus tenderness.  Mouth/Throat: Uvula is midline, oropharynx is clear and moist and mucous membranes are normal. No oropharyngeal exudate.  Eyes: Pupils are equal, round, and reactive to light. Conjunctivae and EOM are normal.  Cardiovascular: Normal rate.  Pulmonary/Chest:  Effort normal.  Lymphadenopathy:    She has no cervical adenopathy.  Neurological: She is alert.  Skin: Skin is warm and dry. No pallor.     Psychiatric: She has a normal mood and affect. Her behavior is normal. Judgment and thought content normal.    Results for orders placed or performed in visit on 08/13/18  Cervicovaginal ancillary only  Result Value Ref Range   Chlamydia Negative    Neisseria gonorrhea Negative    Trichomonas Negative       Assessment & Plan:   Problem List Items Addressed This Visit    None    Visit Diagnoses    Acute non-recurrent maxillary sinusitis    -  Primary   c diff precautions; anbiotics; conservative measures   Relevant Medications   sulfamethoxazole-trimethoprim (BACTRIM DS,SEPTRA DS) 800-160 MG tablet   Cellulitis of groin       c diff precautions; antibiotics       Follow up plan: No follow-ups on file.  An after-visit summary was printed and given to the patient at [facility].  Please see the patient instructions which may contain other information and recommendations beyond what is mentioned above in the assessment and plan.  Meds ordered this encounter  Medications  . sulfamethoxazole-trimethoprim (BACTRIM DS,SEPTRA DS) 800-160 MG tablet    Sig: Take 1 tablet by mouth 2 (two) times daily.    Dispense:  20 tablet    Refill:  0    No orders of the defined types were placed in this encounter.

## 2018-09-19 NOTE — Patient Instructions (Signed)
Start the antibiotics Please do eat yogurt or kimchi or take a probiotic daily for the next month We want to replace the healthy germs in the gut If you notice foul, watery diarrhea in the next two months, schedule an appointment RIGHT AWAY or go to an urgent care or the emergency room if a holiday or over a weekend

## 2018-09-20 ENCOUNTER — Encounter: Payer: Self-pay | Admitting: Family Medicine

## 2018-09-21 ENCOUNTER — Other Ambulatory Visit: Payer: Self-pay

## 2018-09-21 DIAGNOSIS — Z3041 Encounter for surveillance of contraceptive pills: Secondary | ICD-10-CM

## 2018-09-23 MED ORDER — NORGESTIMATE-ETH ESTRADIOL 0.25-35 MG-MCG PO TABS: ORAL_TABLET | ORAL | 0 refills | Status: DC

## 2018-10-30 ENCOUNTER — Other Ambulatory Visit: Payer: Self-pay | Admitting: Family Medicine

## 2018-10-30 ENCOUNTER — Telehealth: Payer: Self-pay | Admitting: Nurse Practitioner

## 2018-10-30 ENCOUNTER — Encounter: Payer: Self-pay | Admitting: Family Medicine

## 2018-10-30 DIAGNOSIS — R197 Diarrhea, unspecified: Secondary | ICD-10-CM

## 2018-10-30 NOTE — Telephone Encounter (Signed)
Received request for refill for Vitamin D. Lab note from Dr. Sanda Klein in June states she should now take at least 1,000 of Vitamin D daily this is over the counter if she would like a script for this we can provide it but it is OTC. __________ "Your vitamin D level is quite low, so I'm going to send in a prescription for vitamin D to be taken once a week for the next 8 weeks. After that, you can take 1,000 iu of over-the-counter vitamin D3 once a day. " retrieved from labs resulted on 06/19/2018

## 2018-10-31 NOTE — Telephone Encounter (Signed)
Called patient left vm in regards to getting vitamin D otc.

## 2018-12-05 ENCOUNTER — Other Ambulatory Visit: Payer: Self-pay | Admitting: Obstetrics and Gynecology

## 2018-12-05 DIAGNOSIS — Z3041 Encounter for surveillance of contraceptive pills: Secondary | ICD-10-CM

## 2019-01-08 ENCOUNTER — Ambulatory Visit: Admission: RE | Admit: 2019-01-08 | Payer: 59 | Source: Home / Self Care | Admitting: *Deleted

## 2019-01-08 ENCOUNTER — Encounter: Payer: Self-pay | Admitting: Family Medicine

## 2019-01-08 ENCOUNTER — Ambulatory Visit (INDEPENDENT_AMBULATORY_CARE_PROVIDER_SITE_OTHER): Payer: 59 | Admitting: Family Medicine

## 2019-01-08 VITALS — BP 110/72 | HR 99 | Temp 97.6°F | Ht 65.0 in | Wt 260.7 lb

## 2019-01-08 DIAGNOSIS — S92534D Nondisplaced fracture of distal phalanx of right lesser toe(s), subsequent encounter for fracture with routine healing: Secondary | ICD-10-CM | POA: Diagnosis not present

## 2019-01-08 DIAGNOSIS — R7401 Elevation of levels of liver transaminase levels: Secondary | ICD-10-CM

## 2019-01-08 DIAGNOSIS — R7301 Impaired fasting glucose: Secondary | ICD-10-CM

## 2019-01-08 DIAGNOSIS — E559 Vitamin D deficiency, unspecified: Secondary | ICD-10-CM

## 2019-01-08 DIAGNOSIS — E781 Pure hyperglyceridemia: Secondary | ICD-10-CM

## 2019-01-08 DIAGNOSIS — R718 Other abnormality of red blood cells: Secondary | ICD-10-CM | POA: Diagnosis not present

## 2019-01-08 DIAGNOSIS — R740 Nonspecific elevation of levels of transaminase and lactic acid dehydrogenase [LDH]: Secondary | ICD-10-CM

## 2019-01-08 MED ORDER — RANITIDINE HCL 150 MG PO TABS: 150.0000 mg | ORAL_TABLET | Freq: Every day | ORAL | Status: DC | PRN

## 2019-01-08 NOTE — Assessment & Plan Note (Signed)
Check labs today.

## 2019-01-08 NOTE — Assessment & Plan Note (Signed)
Check CBC 

## 2019-01-08 NOTE — Assessment & Plan Note (Signed)
Check labs today; weight loss encouraged

## 2019-01-08 NOTE — Assessment & Plan Note (Signed)
Check glucose and A1c today; cautioned her about weight gain and development of type 2 diabetes; single most important thing to do is lose weight

## 2019-01-08 NOTE — Assessment & Plan Note (Signed)
Check vit D and supplement if needed 

## 2019-01-08 NOTE — Progress Notes (Signed)
BP 110/72   Pulse 99   Temp 97.6 F (36.4 C) (Oral)   Ht 5\' 5"  (1.651 m)   Wt 260 lb 11.2 oz (118.3 kg)   LMP 01/01/2019   SpO2 95%   BMI 43.38 kg/m    Subjective:    Patient ID: Connie Sims, female    DOB: 1981-04-16, 38 y.o.   MRN: [id number]  HPI: Connie Sims is a 38 y.o. female  Chief Complaint  Patient presents with  . Toe Injury    patient states slammed toe into space heater.  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, minerals to [employer]; they xrayed it there; they sent her an e-mail; fifth toe on the right foot They wrote her out 2-3 weeks at the urgent care She is supposed to return to work They have her restrictions for no prolonged standing for 2-3 weeks; wearing steel toed shoes She has been staying off of it Pain level right now is a 1 out of 10; doesn't really hurt A little red over night; no fevers Her job involves Barrister's clerk, 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 No numbness, no fevers She has a hx of fracture in her right foot before in 2012; at work; stepped down from platform about 16 inches high, stepped down, instant pain; grabbed a trash can with wheels and rolled to room, ice packs and went home; one of the longer bones on the outside of the right foot; out for four months with that one  Hx of prediabetes; has gained weight over the holidays; definitely related to eating; drinks sodas, "a lot lately"; regular and not diet; no dry mouth; no blurred vision; hx of fatty liver, elevated alt, hight TG; energy level is pretty even-keeled; vit D  deficiency but energy level is stable  Depression screen [facility] 2/9 01/08/2019 07/23/2018 06/19/2018 02/15/2018 06/08/2017  Decreased Interest 0 0 0 0 0  Down, Depressed, Hopeless 0 0 0 0 0  PHQ - 2 Score 0 0 0 0 0  Altered sleeping 0 - - - -  Tired, decreased energy 0 - - - -  Change in appetite 0 - - - -  Feeling bad or failure about yourself  0 - - - -  Trouble concentrating 0 - - - -  Moving slowly or fidgety/restless 0 - - - -  Suicidal thoughts 0 - - - -  PHQ-9 Score 0 - - - -  Difficult doing work/chores Not difficult at all - - - -   Fall Risk  09/19/2018 07/23/2018 06/19/2018 02/15/2018 06/08/2017  Falls in the past year? No No No No No    Relevant past medical, surgical, family and social history reviewed Past Medical History:  Diagnosis Date  . Allergic rhinitis   . Allergic rhinitis with postnasal drip   . Congenital osteopetrosis    previously managed by specialist at [facility], periodic hearing tests  . Diabetes mellitus without complication ([place])   . Epigastric pain   . Foot fracture, right  Jones fx 5th metatarsal  . Impaired fasting glucose   . Insomnia   . Low back pain   . LUQ pain   . Microcytosis   . Morbid obesity with BMI of 40.0-44.9, adult ([place])   . PCOS (polycystic ovarian syndrome)   . Trochanteric bursitis of right hip    Past Surgical History:  Procedure Laterality Date  . NASAL SINUS SURGERY     Family History  Problem Relation Age of Onset  . Liver disease Mother        fatty liver, s/p liver biopsy  . Alcohol abuse Mother   . Hypertension Maternal Grandmother   . Glaucoma Maternal Grandmother   . Diabetes Maternal Grandfather   . Heart disease Maternal Grandfather   . Breast cancer Neg Hx   . Ovarian cancer Neg Hx   . Colon cancer Neg Hx    Social History   Tobacco Use  . Smoking status: Never Smoker  . Smokeless tobacco: Never Used  Substance Use Topics  . Alcohol use: No    Alcohol/week: 0.0 standard drinks  . Drug use: No      Office Visit from 01/08/2019 in [facility]  AUDIT-C Score  0      Interim medical history since last visit reviewed. Allergies and medications reviewed  Review of Systems Per HPI unless specifically indicated above     Objective:    BP 110/72   Pulse 99   Temp 97.6 F (36.4 C) (Oral)   Ht 5\' 5"  (1.651 m)   Wt 260 lb 11.2 oz (118.3 kg)   LMP 01/01/2019   SpO2 95%   BMI 43.38 kg/m   Wt Readings from Last 3 Encounters:  01/08/19 260 lb 11.2 oz (118.3 kg)  09/19/18 250 lb 14.4 oz (113.8 kg)  07/23/18 249 lb 3.2 oz (113 kg)    Physical Exam Constitutional:      Appearance: She is well-developed. She is obese.  Eyes:     General: No scleral icterus. Cardiovascular:     Rate and Rhythm: Normal rate and regular rhythm.     Pulses:          Dorsalis pedis pulses are 1+ on the right side.  Pulmonary:     Effort: Pulmonary effort is normal.     Breath sounds: Normal breath sounds.  Abdominal:     General: Bowel sounds are normal. There is no distension.     Palpations: Abdomen is soft.     Tenderness: There is no abdominal tenderness.  Musculoskeletal:     Right foot: Normal range of motion. Deformity (little swelling of the pinky toe RIGHT foot) present. No bunion.  Feet:     Right foot:     Protective Sensation: 5 sites tested. 5 sites sensed.     Skin integrity: Erythema (mild erythema RIGHT pinky toe laterally) present. No skin breakdown, callus, dry skin or fissure.  Skin:    Coloration: Skin is not jaundiced or pale.  Neurological:     Mental Status: She is alert.  Psychiatric:        Behavior: Behavior normal.     Results for orders placed or performed in visit on 08/13/18  Cervicovaginal ancillary only  Result Value Ref Range   Chlamydia Negative    Neisseria gonorrhea Negative    Trichomonas Negative       Assessment & Plan:   Problem List Items Addressed This Visit      Endocrine  Impaired fasting glucose    Check glucose  and A1c today; cautioned her about weight gain and development of type 2 diabetes; single most important thing to do is lose weight      Relevant Orders   Hemoglobin A1c     Other   Vitamin D deficiency    Check vit D and supplement if needed      Relevant Orders   VITAMIN D 25 Hydroxy (Vit-D Deficiency, Fractures)   Morbid obesity ([place])    Already worked with nutritionist; did not find it helpful; encouraged limiting empty calories; see AVS      Relevant Orders   TSH   Microcytosis    Check CBC      Relevant Orders   CBC with Differential/Platelet   Hypertriglyceridemia    Check labs today      Relevant Orders   Lipid panel   Elevated alanine aminotransferase (ALT) level    Check labs today; weight loss encouraged      Relevant Orders   COMPLETE METABOLIC PANEL WITH GFR    Other Visit Diagnoses    Closed nondisplaced fracture of distal phalanx of lesser toe of right foot with routine healing, subsequent encounter    -  Primary   Relevant Orders   Ambulatory referral to Orthopedics       Follow up plan: Return in about 3 months (around 04/09/2019) for follow-up visit with Dr. Sanda Klein.  An after-visit summary was printed and given to the patient at [facility].  Please see the patient instructions which may contain other information and recommendations beyond what is mentioned above in the assessment and plan.  Meds ordered this encounter  Medications  . ranitidine (ZANTAC) 150 MG tablet    Sig: Take 1 tablet (150 mg total) by mouth daily as needed.    Orders Placed This Encounter  Procedures  . CBC with Differential/Platelet  . COMPLETE METABOLIC PANEL WITH GFR  . Hemoglobin A1c  . Lipid panel  . TSH  . VITAMIN D 25 Hydroxy (Vit-D Deficiency, Fractures)  . Ambulatory referral to Orthopedics

## 2019-01-08 NOTE — Patient Instructions (Addendum)
Check out the information at familydoctor.org entitled "Nutrition for Weight Loss: What You Need to Know about Fad Diets" Try to lose between 1-2 pounds per week by taking in fewer calories and burning off more calories You can succeed by limiting portions, limiting foods dense in calories and fat, becoming more active, and drinking 8 glasses of water a day (64 ounces) Don't skip meals, especially breakfast, as skipping meals may alter your metabolism Do not use over-the-counter weight loss pills or gimmicks that claim rapid weight loss A healthy BMI (or body mass index) is between 18.5 and 24.9 You can calculate your ideal BMI at the NIH website http://www.nhlbi.nih.gov/health/educational/lose_wt/BMI/bmicalc.htm  Obesity, Adult Obesity is the condition of having too much total body fat. Being overweight or obese means that your weight is greater than what is considered healthy for your body size. Obesity is determined by a measurement called BMI. BMI is an estimate of body fat and is calculated from height and weight. For adults, a BMI of 30 or higher is considered obese. Obesity can eventually lead to other health concerns and major illnesses, including:  Stroke.  Coronary artery disease (CAD).  Type 2 diabetes.  Some types of cancer, including cancers of the colon, breast, uterus, and gallbladder.  Osteoarthritis.  High blood pressure (hypertension).  High cholesterol.  Sleep apnea.  Gallbladder stones.  Infertility problems. What are the causes? The main cause of obesity is taking in (consuming) more calories than your body uses for energy. Other factors that contribute to this condition may include:  Being born with genes that make you more likely to become obese.  Having a medical condition that causes obesity. These conditions include: ? Hypothyroidism. ? Polycystic ovarian syndrome (PCOS). ? Binge-eating disorder. ? Cushing syndrome.  Taking certain medicines, such  as steroids, antidepressants, and seizure medicines.  Not being physically active (sedentary lifestyle).  Living where there are limited places to exercise safely or buy healthy foods.  Not getting enough sleep. What increases the risk? The following factors may increase your risk of this condition:  Having a family history of obesity.  Being a woman of African-American descent.  Being a man of Hispanic descent. What are the signs or symptoms? Having excessive body fat is the main symptom of this condition. How is this diagnosed? This condition may be diagnosed based on:  Your symptoms.  Your medical history.  A physical exam. Your health care provider may measure: ? Your BMI. If you are an adult with a BMI between 25 and less than 30, you are considered overweight. If you are an adult with a BMI of 30 or higher, you are considered obese. ? The distances around your hips and your waist (circumferences). These may be compared to each other to help diagnose your condition. ? Your skinfold thickness. Your health care provider may gently pinch a fold of your skin and measure it. How is this treated? Treatment for this condition often includes changing your lifestyle. Treatment may include some or all of the following:  Dietary changes. Work with your health care provider and a dietitian to set a weight-loss goal that is healthy and reasonable for you. Dietary changes may include eating: ? Smaller portions. A portion size is the amount of a particular food that is healthy for you to eat at one time. This varies from person to person. ? Low-calorie or low-fat options. ? More whole grains, fruits, and vegetables.  Regular physical activity. This may include aerobic activity (cardio) and   strength training.  Medicine to help you lose weight. Your health care provider may prescribe medicine if you are unable to lose 1 pound a week after 6 weeks of eating more healthily and doing more  physical activity.  Surgery. Surgical options may include gastric banding and gastric bypass. Surgery may be done if: ? Other treatments have not helped to improve your condition. ? You have a BMI of 40 or higher. ? You have life-threatening health problems related to obesity. Follow these instructions at home:  Eating and drinking   Follow recommendations from your health care provider about what you eat and drink. Your health care provider may advise you to: ? Limit fast foods, sweets, and processed snack foods. ? Choose low-fat options, such as low-fat milk instead of whole milk. ? Eat 5 or more servings of fruits or vegetables every day. ? Eat at home more often. This gives you more control over what you eat. ? Choose healthy foods when you eat out. ? Learn what a healthy portion size is. ? Keep low-fat snacks on hand. ? Avoid sugary drinks, such as soda, fruit juice, iced tea sweetened with sugar, and flavored milk. ? Eat a healthy breakfast.  Drink enough water to keep your urine clear or pale yellow.  Do not go without eating for long periods of time (do not fast) or follow a fad diet. Fasting and fad diets can be unhealthy and even dangerous. Physical Activity  Exercise regularly, as told by your health care provider. Ask your health care provider what types of exercise are safe for you and how often you should exercise.  Warm up and stretch before being active.  Cool down and stretch after being active.  Rest between periods of activity. Lifestyle  Limit the time that you spend in front of your TV, computer, or video game system.  Find ways to reward yourself that do not involve food.  Limit alcohol intake to no more than 1 drink a day for nonpregnant women and 2 drinks a day for men. One drink equals 12 oz of beer, 5 oz of wine, or 1 oz of hard liquor. General instructions  Keep a weight loss journal to keep track of the food you eat and how much you exercise you  get.  Take over-the-counter and prescription medicines only as told by your health care provider.  Take vitamins and supplements only as told by your health care provider.  Consider joining a support group. Your health care provider may be able to recommend a support group.  Keep all follow-up visits as told by your health care provider. This is important. Contact a health care provider if:  You are unable to meet your weight loss goal after 6 weeks of dietary and lifestyle changes. This information is not intended to replace advice given to you by your health care provider. Make sure you discuss any questions you have with your health care provider. Document Released: 01/19/2005 Document Revised: 05/16/2016 Document Reviewed: 09/30/2015 Elsevier Interactive Patient Education  2019 Elsevier Inc.  Preventing Unhealthy Weight Gain, Adult Staying at a healthy weight is important to your overall health. When fat builds up in your body, you may become overweight or obese. Being overweight or obese increases your risk of developing certain health problems, such as heart disease, diabetes, sleeping problems, joint problems, and some types of cancer. Unhealthy weight gain is often the result of making unhealthy food choices or not getting enough exercise. You can make changes   to your lifestyle to prevent obesity and stay as healthy as possible. What nutrition changes can be made?   Eat only as much as your body needs. To do this: ? Pay attention to signs that you are hungry or full. Stop eating as soon as you feel full. ? If you feel hungry, try drinking water first before eating. Drink enough water so your urine is clear or pale yellow. ? Eat smaller portions. Pay attention to portion sizes when eating out. ? Look at serving sizes on food labels. Most foods contain more than one serving per container. ? Eat the recommended number of calories for your gender and activity level. For most active  people, a daily total of 2,000 calories is appropriate. If you are trying to lose weight or are not very active, you may need to eat fewer calories. Talk with your health care provider or a diet and nutrition specialist (dietitian) about how many calories you need each day.  Choose healthy foods, such as: ? Fruits and vegetables. At each meal, try to fill at least half of your plate with fruits and vegetables. ? Whole grains, such as whole-wheat bread, brown rice, and quinoa. ? Lean meats, such as chicken or fish. ? Other healthy proteins, such as beans, eggs, or tofu. ? Healthy fats, such as nuts, seeds, fatty fish, and olive oil. ? Low-fat or fat-free dairy products.  Check food labels, and avoid food and drinks that: ? Are high in calories. ? Have added sugar. ? Are high in sodium. ? Have saturated fats or trans fats.  Cook foods in healthier ways, such as by baking, broiling, or grilling.  Make a meal plan for the week, and shop with a grocery list to help you stay on track with your purchases. Try to avoid going to the grocery store when you are hungry.  When grocery shopping, try to shop around the outside of the store first, where the fresh foods are. Doing this helps you to avoid prepackaged foods, which can be high in sugar, salt (sodium), and fat. What lifestyle changes can be made?   Exercise for 30 or more minutes on 5 or more days each week. Exercising may include brisk walking, yard work, biking, running, swimming, and team sports like basketball and soccer. Ask your health care provider which exercises are safe for you.  Do muscle-strengthening activities, such as lifting weights or using resistance bands, on 2 or more days a week.  Do not use any products that contain nicotine or tobacco, such as cigarettes and e-cigarettes. If you need help quitting, ask your health care provider.  Limit alcohol intake to no more than 1 drink a day for nonpregnant women and 2 drinks a  day for men. One drink equals 12 oz of beer, 5 oz of wine, or 1 oz of hard liquor.  Try to get 7-9 hours of sleep each night. What other changes can be made?  Keep a food and activity journal to keep track of: ? What you ate and how many calories you had. Remember to count the calories in sauces, dressings, and side dishes. ? Whether you were active, and what exercises you did. ? Your calorie, weight, and activity goals.  Check your weight regularly. Track any changes. If you notice you have gained weight, make changes to your diet or activity routine.  Avoid taking weight-loss medicines or supplements. Talk to your health care provider before starting any new medicine or supplement.  Talk   to your health care provider before trying any new diet or exercise plan. Why are these changes important? Eating healthy, staying active, and having healthy habits can help you to prevent obesity. Those changes also:  Help you manage stress and emotions.  Help you connect with friends and family.  Improve your self-esteem.  Improve your sleep.  Prevent long-term health problems. What can happen if changes are not made? Being obese or overweight can cause you to develop joint or bone problems, which can make it hard for you to stay active or do activities you enjoy. Being obese or overweight also puts stress on your heart and lungs and can lead to health problems like diabetes, heart disease, and some cancers. Where to find more information Talk with your health care provider or a dietitian about healthy eating and healthy lifestyle choices. You may also find information from:  U.S. Department of Agriculture, MyPlate: www.choosemyplate.gov  American Heart Association: www.heart.org  Centers for Disease Control and Prevention: www.cdc.gov Summary  Staying at a healthy weight is important to your overall health. It helps you to prevent certain diseases and health problems, such as heart  disease, diabetes, joint problems, sleep disorders, and some types of cancer.  Being obese or overweight can cause you to develop joint or bone problems, which can make it hard for you to stay active or do activities you enjoy.  You can prevent unhealthy weight gain by eating a healthy diet, exercising regularly, not smoking, limiting alcohol, and getting enough sleep.  Talk with your health care provider or a dietitian for guidance about healthy eating and healthy lifestyle choices. This information is not intended to replace advice given to you by your health care provider. Make sure you discuss any questions you have with your health care provider. Document Released: 12/13/2016 Document Revised: 09/22/2017 Document Reviewed: 01/18/2017 Elsevier Interactive Patient Education  2019 Elsevier Inc.  

## 2019-01-08 NOTE — Assessment & Plan Note (Signed)
Already worked with nutritionist; did not find it helpful; encouraged limiting empty calories; see AVS

## 2019-01-09 ENCOUNTER — Other Ambulatory Visit: Payer: Self-pay | Admitting: Family Medicine

## 2019-01-09 DIAGNOSIS — D649 Anemia, unspecified: Secondary | ICD-10-CM

## 2019-01-09 DIAGNOSIS — R718 Other abnormality of red blood cells: Secondary | ICD-10-CM

## 2019-01-09 LAB — CBC WITH DIFFERENTIAL/PLATELET
Absolute Monocytes: 642 cells/uL (ref 200–950)
Basophils Absolute: 64 cells/uL (ref 0–200)
Basophils Relative: 0.6 %
Eosinophils Absolute: 310 cells/uL (ref 15–500)
Eosinophils Relative: 2.9 %
HCT: 36.3 % (ref 35.0–45.0)
Hemoglobin: 11.6 g/dL — ABNORMAL LOW (ref 11.7–15.5)
Lymphs Abs: 2194 cells/uL (ref 850–3900)
MCH: 26 pg — ABNORMAL LOW (ref 27.0–33.0)
MCHC: 32 g/dL (ref 32.0–36.0)
MCV: 81.4 fL (ref 80.0–100.0)
MPV: 10.1 fL (ref 7.5–12.5)
Monocytes Relative: 6 %
Neutro Abs: 7490 cells/uL (ref 1500–7800)
Neutrophils Relative %: 70 %
Platelets: 349 10*3/uL (ref 140–400)
RBC: 4.46 10*6/uL (ref 3.80–5.10)
RDW: 15.3 % — ABNORMAL HIGH (ref 11.0–15.0)
Total Lymphocyte: 20.5 %
WBC: 10.7 10*3/uL (ref 3.8–10.8)

## 2019-01-09 LAB — TSH: TSH: 2.83 mIU/L

## 2019-01-09 LAB — COMPLETE METABOLIC PANEL WITH GFR
AG Ratio: 1.6 (calc) (ref 1.0–2.5)
ALT: 29 U/L (ref 6–29)
AST: 17 U/L (ref 10–30)
Albumin: 4.1 g/dL (ref 3.6–5.1)
Alkaline phosphatase (APISO): 92 U/L (ref 33–115)
BUN: 15 mg/dL (ref 7–25)
CO2: 25 mmol/L (ref 20–32)
Calcium: 9.2 mg/dL (ref 8.6–10.2)
Chloride: 104 mmol/L (ref 98–110)
Creat: 0.89 mg/dL (ref 0.50–1.10)
GFR, Est African American: 95 mL/min/{1.73_m2} (ref >=60)
GFR, Est Non African American: 82 mL/min/{1.73_m2} (ref >=60)
Globulin: 2.6 g/dL (calc) (ref 1.9–3.7)
Glucose, Bld: 90 mg/dL (ref 65–99)
Potassium: 4.3 mmol/L (ref 3.5–5.3)
Sodium: 138 mmol/L (ref 135–146)
Total Bilirubin: 0.3 mg/dL (ref 0.2–1.2)
Total Protein: 6.7 g/dL (ref 6.1–8.1)

## 2019-01-09 LAB — HEMOGLOBIN A1C
Hgb A1c MFr Bld: 5.9 % of total Hgb — ABNORMAL HIGH (ref <5.7)
Mean Plasma Glucose: 123 (calc)
eAG (mmol/L): 6.8 (calc)

## 2019-01-09 LAB — LIPID PANEL
Cholesterol: 157 mg/dL (ref <200)
HDL: 43 mg/dL — ABNORMAL LOW (ref >50)
LDL Cholesterol (Calc): 82 mg/dL (calc)
Non-HDL Cholesterol (Calc): 114 mg/dL (calc) (ref <130)
Total CHOL/HDL Ratio: 3.7 (calc) (ref <5.0)
Triglycerides: 218 mg/dL — ABNORMAL HIGH (ref <150)

## 2019-01-09 LAB — VITAMIN D 25 HYDROXY (VIT D DEFICIENCY, FRACTURES): Vit D, 25-Hydroxy: 20 ng/mL — ABNORMAL LOW (ref 30–100)

## 2019-01-09 MED ORDER — VITAMIN D (ERGOCALCIFEROL) 1.25 MG (50000 UNIT) PO CAPS: 50000.0000 [IU] | ORAL_CAPSULE | ORAL | 1 refills | Status: DC

## 2019-01-09 MED ORDER — METFORMIN HCL ER 500 MG PO TB24: 500.0000 mg | ORAL_TABLET | Freq: Every day | ORAL | 1 refills | Status: DC

## 2019-01-12 ENCOUNTER — Encounter: Payer: Self-pay | Admitting: Family Medicine

## 2019-01-31 ENCOUNTER — Other Ambulatory Visit: Payer: Self-pay | Admitting: Family Medicine

## 2019-02-03 ENCOUNTER — Encounter: Payer: Self-pay | Admitting: Family Medicine

## 2019-02-15 ENCOUNTER — Encounter: Payer: Self-pay | Admitting: Family Medicine

## 2019-02-15 DIAGNOSIS — H65197 Other acute nonsuppurative otitis media recurrent, unspecified ear: Secondary | ICD-10-CM

## 2019-02-15 MED ORDER — PIOGLITAZONE HCL 15 MG PO TABS: 15.0000 mg | ORAL_TABLET | Freq: Every day | ORAL | 5 refills | Status: DC

## 2019-04-10 ENCOUNTER — Ambulatory Visit: Payer: 59 | Admitting: Family Medicine

## 2019-07-18 ENCOUNTER — Encounter: Payer: Self-pay | Admitting: Family Medicine

## 2019-08-23 ENCOUNTER — Telehealth: Payer: Self-pay | Admitting: Family Medicine

## 2019-08-23 NOTE — Telephone Encounter (Signed)
Please schedule routine follow-up within 1 month

## 2019-08-26 NOTE — Telephone Encounter (Signed)
LVM for pt to call the office to schedule an appt. °

## 2019-09-16 ENCOUNTER — Other Ambulatory Visit: Payer: Self-pay

## 2019-09-16 NOTE — Telephone Encounter (Signed)
LVM to get an appt per the dr

## 2019-09-18 ENCOUNTER — Other Ambulatory Visit: Payer: Self-pay

## 2019-09-18 ENCOUNTER — Other Ambulatory Visit: Payer: Self-pay | Admitting: Family Medicine

## 2019-09-18 MED ORDER — PIOGLITAZONE HCL 15 MG PO TABS: 15.0000 mg | ORAL_TABLET | Freq: Every day | ORAL | 0 refills | Status: DC

## 2019-10-09 ENCOUNTER — Other Ambulatory Visit: Payer: Self-pay

## 2019-10-09 NOTE — Telephone Encounter (Signed)
Ins request 90 day

## 2020-01-08 ENCOUNTER — Telehealth: Payer: 59 | Admitting: Family

## 2020-01-08 DIAGNOSIS — Z20822 Contact with and (suspected) exposure to covid-19: Secondary | ICD-10-CM | POA: Diagnosis not present

## 2020-01-08 MED ORDER — BENZONATATE 100 MG PO CAPS: 100.0000 mg | ORAL_CAPSULE | Freq: Three times a day (TID) | ORAL | 0 refills | Status: DC | PRN

## 2020-01-08 NOTE — Progress Notes (Signed)
E-Visit for Corona Virus Screening  Your current symptoms could be consistent with the coronavirus.  Many health care providers can now test patients at their office but not all are.  [place] has multiple testing sites. For information on our [place] testing locations and hours go to [email or web address]  We are enrolling you in our [place] for [place] . Daily you will receive a questionnaire within the [facility] website. Our COVID 19 response team will be monitoring your responses daily.  Testing Information: The COVID-19 Community Testing sites will begin testing BY APPOINTMENT ONLY.  You can schedule online at [email or web address]  If you do not have access to a smart phone or computer you may call [phone] for an appointment.  Testing Locations: Appointment schedule is 8 am to 3:30 pm at all sites  [facility] indoors at [address] [place] [address] [facility]  indoors at [place]. [address] [place], [place] [address] [place] indoors at [address] [place] [address]  Additional testing sites in the Community:  . For [facility] Testing sites in [facility]  FaceUpdate.uy  . For Pop-up testing sites in [place]  BowlDirectory.co.uk  . For Testing sites with regular hours [email or web address]  . For [place] [email or web address]  . For [facility] [email or web address]  . For [facility] testing in [place] and [employer] [email or web address]  . For [facility] testing in [facility]   [email or web address]  For  more information  about community testing call [phone]   We are enrolling you in our [place] for [place] . Daily you will receive a questionnaire within the [facility] website. Our COVID 19 response team will be monitoring your responses daily.  Please quarantine yourself while awaiting your test results. Please stay home for a minimum of 10 days from the first day of illness with improving symptoms and you have had 24 hours of no fever (without the use of Tylenol (Acetaminophen) Motrin (Ibuprofen) or any fever reducing medication).  Also - Do not get tested prior to returning to [facility] because once you have had a positive test the test can stay positive for more then a month in some cases.   You should wear a mask or cloth face covering over your nose and mouth if you must be around other people or animals, including pets (even at home). Try to stay at least 6 feet away from other people. This will protect the people around you.  Please continue good preventive care measures, including:  frequent hand-washing, avoid touching your face, cover coughs/sneezes, stay out of crowds and keep a 6 foot distance from others.  COVID-19 is a respiratory illness with symptoms that are similar to the flu. Symptoms are typically mild to moderate, but there have been cases of severe illness and death due to the virus.   The following symptoms may appear 2-14 days after exposure: . Fever . Cough . Shortness of breath or difficulty breathing . Chills . Repeated shaking with chills . Muscle pain . Headache . Sore throat . New loss of taste or smell . Fatigue . Congestion or runny nose . Nausea or vomiting . Diarrhea  Go to the nearest hospital ED for assessment if fever/cough/breathlessness are severe or illness seems like a threat to life.  It is vitally important that if you feel that you have an infection such as this virus or any other virus that you stay home and away from places  where you may spread it to  others.  You should avoid contact with people age 55 and older.   You can use medication such as A prescription cough medication called Tessalon Perles 100 mg. You may take 1-2 capsules every 8 hours as needed for cough.  You may also take acetaminophen (Tylenol) as needed for fever.  Reduce your risk of any infection by using the same precautions used for avoiding the common cold or flu:  [place] Kitchen Wash your hands often with soap and warm water for at least 20 seconds.  If soap and water are not readily available, use an alcohol-based hand sanitizer with at least 60% alcohol.  . If coughing or sneezing, cover your mouth and nose by coughing or sneezing into the elbow areas of your shirt or coat, into a tissue or into your sleeve (not your hands). . Avoid shaking hands with others and consider head nods or verbal greetings only. . Avoid touching your eyes, nose, or mouth with unwashed hands.  . Avoid close contact with people who are sick. . Avoid places or events with large numbers of people in one location, like concerts or sporting events. . Carefully consider travel plans you have or are making. . If you are planning any travel outside or inside the [place], visit the CDC's Travelers' Health webpage for the latest health notices. . If you have some symptoms but not all symptoms, continue to monitor at home and seek medical attention if your symptoms worsen. . If you are having a medical emergency, call 911.  HOME CARE . Only take medications as instructed by your medical team. . Drink plenty of fluids and get plenty of rest. . A steam or ultrasonic humidifier can help if you have congestion.   GET HELP RIGHT AWAY IF YOU HAVE EMERGENCY WARNING SIGNS** FOR COVID-19. If you or someone is showing any of these signs seek emergency medical care immediately. Call 911 or proceed to your closest emergency facility if: . You develop worsening high fever. . Trouble breathing . Bluish lips or face . Persistent  pain or pressure in the chest . New confusion . Inability to wake or stay awake . You cough up blood. . Your symptoms become more severe  **This list is not all possible symptoms. Contact your medical provider for any symptoms that are sever or concerning to you.  MAKE SURE YOU   Understand these instructions.  Will watch your condition.  Will get help right away if you are not doing well or get worse.  Your e-visit answers were reviewed by a board certified advanced clinical practitioner to complete your personal care plan.  Depending on the condition, your plan could have included both over the counter or prescription medications.  If there is a problem please reply once you have received a response from your provider.  Your safety is important to [place].  If you have drug allergies check your prescription carefully.    You can use MyChart to ask questions about today's visit, request a non-urgent call back, or ask for a work or school excuse for 24 hours related to this e-Visit. If it has been greater than 24 hours you will need to follow up with your provider, or enter a new e-Visit to address those concerns. You will get an e-mail in the next two days asking about your experience.  I hope that your e-visit has been valuable and will speed your recovery. Thank you for using e-visits.  Approximately  5 minutes was spent documenting and reviewing patient's chart.

## 2020-01-09 ENCOUNTER — Ambulatory Visit: Payer: 59 | Attending: Internal Medicine

## 2020-01-09 DIAGNOSIS — Z20822 Contact with and (suspected) exposure to covid-19: Secondary | ICD-10-CM

## 2020-01-10 ENCOUNTER — Encounter: Payer: Self-pay | Admitting: Family Medicine

## 2020-01-10 ENCOUNTER — Ambulatory Visit: Payer: 59 | Admitting: Family Medicine

## 2020-01-10 DIAGNOSIS — Z5329 Procedure and treatment not carried out because of patient's decision for other reasons: Secondary | ICD-10-CM

## 2020-01-10 DIAGNOSIS — Z91199 Patient's noncompliance with other medical treatment and regimen due to unspecified reason: Secondary | ICD-10-CM

## 2020-01-10 LAB — NOVEL CORONAVIRUS, NAA: SARS-CoV-2, NAA: NOT DETECTED

## 2020-01-10 NOTE — Progress Notes (Signed)
Not seen, no show.

## 2020-02-14 ENCOUNTER — Ambulatory Visit (INDEPENDENT_AMBULATORY_CARE_PROVIDER_SITE_OTHER)
Admission: RE | Admit: 2020-02-14 | Discharge: 2020-02-14 | Disposition: A | Discharge status: Home / Self Care | Payer: 59 | Source: Ambulatory Visit

## 2020-02-14 DIAGNOSIS — G4700 Insomnia, unspecified: Secondary | ICD-10-CM | POA: Diagnosis not present

## 2020-02-14 DIAGNOSIS — G8929 Other chronic pain: Secondary | ICD-10-CM

## 2020-02-14 DIAGNOSIS — Z760 Encounter for issue of repeat prescription: Secondary | ICD-10-CM

## 2020-02-14 DIAGNOSIS — M545 Low back pain: Secondary | ICD-10-CM

## 2020-02-14 MED ORDER — CYCLOBENZAPRINE HCL 5 MG PO TABS: 5.0000 mg | ORAL_TABLET | Freq: Every day | ORAL | 0 refills | Status: DC

## 2020-02-14 NOTE — ED Provider Notes (Addendum)
Virtual Visit via Video Note:  Connie Sims  initiated request for Telemedicine visit with [facility] Urgent Care team. I connected with Connie Sims  on 02/14/2020 at 1:46 PM  for a synchronized telemedicine visit using a video enabled HIPPA compliant telemedicine application. I verified that I am speaking with Connie Sims  using two identifiers. Sharion Balloon, NP  was physically located in a [facility] Urgent care site and LETRISHA STANGLE was located at a different location.   The limitations of evaluation and management by telemedicine as well as the availability of in-person appointments were discussed. Patient was informed that she  may incur a bill ( including co-pay) for this virtual visit encounter. Connie Sims  expressed understanding and gave verbal consent to proceed with virtual visit.     History of Present Illness:Connie Sims  is a 39 y.o. female presents for evaluation of 1 week history of difficulty falling asleep due to lower back pain.  She reports she has chronic intermittent lower back pain.  She requests a refill on her Flexeril.  No recent injury or falls.  Denies fever, chills, abdominal pain, dysuria, vaginal discharge, pelvic pain.  Attempted treatment at home with Unisom without relief.     No Known Allergies   Past Medical History:  Diagnosis Date  . Allergic rhinitis   . Allergic rhinitis with postnasal drip   . Congenital osteopetrosis    previously managed by specialist at [facility] periodic hearing tests  . Diabetes mellitus without complication ([place])   . Epigastric pain   . Foot fracture, right    Jones fx 5th metatarsal  . Impaired fasting glucose   . Insomnia   . Low back pain   . LUQ pain   . Microcytosis   . Morbid obesity with BMI of 40.0-44.9, adult ([place])   . PCOS (polycystic ovarian syndrome)   . Trochanteric bursitis of right hip      Social History   Tobacco Use  . Smoking status: Never Smoker  . Smokeless tobacco:  Never Used  Substance Use Topics  . Alcohol use: No    Alcohol/week: 0.0 standard drinks  . Drug use: No    ROS: as stated in HPI.  All other systems reviewed and negative.      Observations/Objective: Physical Exam  VITALS: Patient denies fever. GENERAL: Alert, appears well and in no acute distress. HEENT: Atraumatic. NECK: Normal movements of the head and neck. CARDIOPULMONARY: No increased WOB. Speaking in clear sentences. I:E ratio WNL.  MS: Moves all visible extremities without noticeable abnormality. PSYCH: Pleasant and cooperative, well-groomed. Speech normal rate and rhythm. Affect is appropriate. Insight and judgement are appropriate. Attention is focused, linear, and appropriate.  NEURO: CN grossly intact. Oriented as arrived to appointment on time with no prompting. Moves both UE equally.  SKIN: No obvious lesions, wounds, erythema, or cyanosis noted on face or hands.   Assessment and Plan:    ICD-10-CM   1. Chronic bilateral low back pain without sciatica  M54.5    G89.29   2. Insomnia, unspecified type  G47.00   3. Encounter for medication refill  Z76.0        Follow Up Instructions: Short course of Flexeril 5 mg q HS provided.  Instructed patient to follow-up with her PCP if her symptoms are not improving.  Patient agrees to plan of care.      I discussed the assessment and treatment plan with the patient.  The patient was provided an opportunity to ask questions and all were answered. The patient agreed with the plan and demonstrated an understanding of the instructions.   The patient was advised to call back or seek an in-person evaluation if the symptoms worsen or if the condition fails to improve as anticipated.      Sharion Balloon, NP  02/14/2020 1:46 PM         Sharion Balloon, NP 02/14/20 1345    Sharion Balloon, NP 02/14/20 1346

## 2020-02-14 NOTE — Discharge Instructions (Addendum)
Take the muscle relaxer Flexeril as needed at bedtime.  Do not drive, operate machinery, or drink alcohol with this medication as it may make you drowsy.    Follow up with your primary care provider if your symptoms are not improving.

## 2020-03-02 ENCOUNTER — Other Ambulatory Visit: Payer: Self-pay

## 2020-03-02 ENCOUNTER — Encounter: Payer: Self-pay | Admitting: Family Medicine

## 2020-03-02 ENCOUNTER — Ambulatory Visit (INDEPENDENT_AMBULATORY_CARE_PROVIDER_SITE_OTHER): Payer: 59 | Admitting: Family Medicine

## 2020-03-02 DIAGNOSIS — Z1159 Encounter for screening for other viral diseases: Secondary | ICD-10-CM

## 2020-03-02 DIAGNOSIS — D649 Anemia, unspecified: Secondary | ICD-10-CM

## 2020-03-02 DIAGNOSIS — E8881 Metabolic syndrome: Secondary | ICD-10-CM

## 2020-03-02 DIAGNOSIS — E282 Polycystic ovarian syndrome: Secondary | ICD-10-CM

## 2020-03-02 DIAGNOSIS — R7301 Impaired fasting glucose: Secondary | ICD-10-CM

## 2020-03-02 DIAGNOSIS — E559 Vitamin D deficiency, unspecified: Secondary | ICD-10-CM

## 2020-03-02 DIAGNOSIS — E785 Hyperlipidemia, unspecified: Secondary | ICD-10-CM

## 2020-03-02 MED ORDER — OZEMPIC (0.25 OR 0.5 MG/DOSE) 2 MG/1.5ML ~~LOC~~ SOPN: 0.5000 mg | PEN_INJECTOR | SUBCUTANEOUS | 2 refills | Status: DC

## 2020-03-02 MED ORDER — VITAMIN D (ERGOCALCIFEROL) 1.25 MG (50000 UNIT) PO CAPS: 50000.0000 [IU] | ORAL_CAPSULE | ORAL | 3 refills | Status: DC

## 2020-03-02 NOTE — Progress Notes (Signed)
Name: Connie Sims   MRN: [id number]    DOB: 1981/05/23   Date:03/02/2020       Progress Note  Subjective  Chief Complaint  Chief Complaint  Patient presents with  . Insomnia    shift change  . PCOS    HPI  Metabolic Syndrome: she has a long history of PCO's , A1C elevation, triglycerides elevation and also fatty liver. She tried Metformin but caused GI symptoms, switched to Actos last year and we will recheck A1C, discussed switching GLP-1 agonist today since she has obesity and she is willing to try it. She denies family history of thyroid cancer/MEN syndrome or personal history of pancreatitis. She denies polyphagia, polydipsia or polyuria.   Morbid obesity: she has always been overweight ( childhood), but worse since age 70 - she went from a size 12/14 to a size 20 ( even though she was in a marching band in college) She likes sodas 40 oz a day, she is not very physically active. 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   Vitamin D : she does not like taking medications, I will send rx medication  Anemia unspecified: we will recheck level, she denies heavy cycles, has pco's . Denies sob or pica.   Low back pain: intermittent , she has osteopetrosis , familial.   Patient Active Problem List   Diagnosis Date Noted  . Right ovarian cyst 06/27/2018  . Uterine leiomyoma 06/14/2018  . Wheat intolerance 02/15/2018  . Dysfunctional uterine bleeding 06/29/2017  . Highly echogenic liver on ultrasound 06/15/2017  . PLMD (periodic limb movement disorder) 02/01/2016  . Microcytosis 02/01/2016  . Vitamin D deficiency 02/01/2016  . Elevated alanine aminotransferase (ALT) level 02/01/2016  . Hypertriglyceridemia 10/23/2015  . 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  . PCO (polycystic ovaries)   . Allergic rhinitis   . Morbid obesity ([place])   . Insomnia   . Low back pain   . Impaired fasting glucose     Past Surgical History:  Procedure  Laterality Date  . NASAL SINUS SURGERY      Family History  Problem Relation Age of Onset  . Liver disease Mother        fatty liver, s/p liver biopsy  . Alcohol abuse Mother   . Hypertension Maternal Grandmother   . Glaucoma Maternal Grandmother   . Diabetes Maternal Grandfather   . Heart disease Maternal Grandfather   . Breast cancer Neg Hx   . Ovarian cancer Neg Hx   . Colon cancer Neg Hx     Social History   Tobacco Use  . Smoking status: Never Smoker  . Smokeless tobacco: Never Used  Substance Use Topics  . Alcohol use: No    Alcohol/week: 0.0 standard drinks     Current Outpatient Medications:  .  norgestimate-ethinyl estradiol (SPRINTEC 28) 0.25-35 MG-MCG tablet, PLEASE SEE ATTACHED FOR DETAILED DIRECTIONS, Disp: 84 tablet, Rfl: 1 .  Green Tea, Camellia sinensis, (GREEN TEA EXTRACT PO), Take by mouth., Disp: , Rfl:  .  Semaglutide,0.25 or 0.5MG /DOS, (OZEMPIC, 0.25 OR 0.5 MG/DOSE,) 2 MG/1.5ML SOPN, Inject 0.5 mg into the skin once a week., Disp: 2 pen, Rfl: 2 .  Vitamin D, Ergocalciferol, (DRISDOL) 1.25 MG (50000 UNIT) CAPS capsule, Take 1 capsule (50,000 Units total) by mouth every 7 (seven) days., Disp: 12 capsule, Rfl: 3  No Known Allergies  I personally reviewed active problem list, medication list, allergies, family history,  social history, health maintenance with the patient/caregiver today.   ROS  Constitutional: Negative for fever, positive for  weight change.  Respiratory: Negative for cough and shortness of breath.   Cardiovascular: Negative for chest pain or palpitations.  Gastrointestinal: Negative for abdominal pain, no bowel changes.  Musculoskeletal: Negative for gait problem or joint swelling.  Skin: Negative for rash.  Neurological: Negative for dizziness or headache.  No other specific complaints in a complete review of systems (except as listed in HPI above).  Objective  Vitals:   03/02/20 0920  BP: 118/76  Pulse: 92  Resp: 16  Temp:  97.9 F (36.6 C)  TempSrc: Temporal  SpO2: 94%  Weight: 272 lb 1.6 oz (123.4 kg)  Height: 5\' 6"  (1.676 m)    Body mass index is 43.92 kg/m.  Physical Exam  Constitutional: Patient appears well-developed and well-nourished. Obese  No distress.  HEENT: head atraumatic, normocephalic, pupils equal and reactive to light Cardiovascular: Normal rate, regular rhythm and normal heart sounds.  No murmur heard. No BLE edema. Pulmonary/Chest: Effort normal and breath sounds normal. No respiratory distress. Abdominal: Soft.  There is no tenderness. Psychiatric: Patient has a normal mood and affect. behavior is normal. Judgment and thought content normal.   Recent Results (from the past 2160 hour(s))  Novel Coronavirus, NAA ([facility])     Status: None   Collection Time: 01/09/20 12:07 PM   Specimen: Nasopharyngeal(NP) swabs in vial transport medium   NASOPHARYNGE  TESTING  Result Value Ref Range   SARS-CoV-2, NAA Not Detected Not Detected    Comment: This nucleic acid amplification test was developed and its performance characteristics determined by [employer]. Nucleic acid amplification tests include PCR and TMA. This test has not been FDA cleared or approved. This test has been authorized by FDA under an Emergency Use Authorization (EUA). This test is only authorized for the duration of time the declaration that circumstances exist justifying the authorization of the emergency use of in vitro diagnostic tests for detection of SARS-CoV-2 virus and/or diagnosis of COVID-19 infection under section 564(b)(1) of the Act, 21 U.S.C. [id number]) (1), unless the authorization is terminated or revoked sooner. When diagnostic testing is negative, the possibility of a false negative result should be considered in the context of a patient's recent exposures and the presence of clinical signs and symptoms consistent with COVID-19. An individual without symptoms of COVID-19 and who is not  shedding SARS-CoV-2 virus would  expect to have a negative (not detected) result in this assay.      PHQ2/9: Depression screen [facility] 2/9 03/02/2020 01/08/2019 07/23/2018 06/19/2018 02/15/2018  Decreased Interest 0 0 0 0 0  Down, Depressed, Hopeless 0 0 0 0 0  PHQ - 2 Score 0 0 0 0 0  Altered sleeping 3 0 - - -  Tired, decreased energy 0 0 - - -  Change in appetite 0 0 - - -  Feeling bad or failure about yourself  0 0 - - -  Trouble concentrating 0 0 - - -  Moving slowly or fidgety/restless 0 0 - - -  Suicidal thoughts 0 0 - - -  PHQ-9 Score 3 0 - - -  Difficult doing work/chores Not difficult at all Not difficult at all - - -    phq 9 is negative   Fall Risk: Fall Risk  03/02/2020 09/19/2018 07/23/2018 06/19/2018 02/15/2018  Falls in the past year? 0 No No No No  Number falls in past yr: 0 - - - -  Injury with Fall? 0 - - - -  Follow up Falls evaluation completed - - - -     Functional Status Survey: Is the patient deaf or have difficulty hearing?: No Does the patient have difficulty seeing, even when wearing glasses/contacts?: No Does the patient have difficulty concentrating, remembering, or making decisions?: No Does the patient have difficulty walking or climbing stairs?: No Does the patient have difficulty dressing or bathing?: No Does the patient have difficulty doing errands alone such as visiting a doctor's office or shopping?: No    Assessment & Plan  1. Morbid obesity ([place])  - Semaglutide,0.25 or 0.5MG /DOS, (OZEMPIC, 0.25 OR 0.5 MG/DOSE,) 2 MG/1.5ML SOPN; Inject 0.5 mg into the skin once a week.  Dispense: 2 pen; Refill: 2  2. Vitamin D deficiency  - Vitamin D, Ergocalciferol, (DRISDOL) 1.25 MG (50000 UNIT) CAPS capsule; Take 1 capsule (50,000 Units total) by mouth every 7 (seven) days.  Dispense: 12 capsule; Refill: 3  3. Impaired fasting glucose  - Hemoglobin A1c - Semaglutide,0.25 or 0.5MG /DOS, (OZEMPIC, 0.25 OR 0.5 MG/DOSE,) 2 MG/1.5ML SOPN; Inject 0.5 mg into  the skin once a week.  Dispense: 2 pen; Refill: 2  4. Metabolic syndrome  - Hemoglobin A1c - Semaglutide,0.25 or 0.5MG /DOS, (OZEMPIC, 0.25 OR 0.5 MG/DOSE,) 2 MG/1.5ML SOPN; Inject 0.5 mg into the skin once a week.  Dispense: 2 pen; Refill: 2  5. PCOS (polycystic ovarian syndrome)  - COMPLETE METABOLIC PANEL WITH GFR  6. Anemia, unspecified type  - CBC with Differential/Platelet - Iron, TIBC and Ferritin Panel  7. Dyslipidemia  - Lipid panel  8. Need for hepatitis C screening test  - Hepatitis C antibody

## 2020-03-03 ENCOUNTER — Other Ambulatory Visit: Payer: Self-pay | Admitting: Family Medicine

## 2020-03-03 DIAGNOSIS — G4726 Circadian rhythm sleep disorder, shift work type: Secondary | ICD-10-CM

## 2020-03-03 LAB — CBC WITH DIFFERENTIAL/PLATELET
Absolute Monocytes: 637 cells/uL (ref 200–950)
Basophils Absolute: 65 cells/uL (ref 0–200)
Basophils Relative: 0.6 %
Eosinophils Absolute: 281 cells/uL (ref 15–500)
Eosinophils Relative: 2.6 %
HCT: 39.8 % (ref 35.0–45.0)
Hemoglobin: 13.2 g/dL (ref 11.7–15.5)
Lymphs Abs: 2333 cells/uL (ref 850–3900)
MCH: 27.3 pg (ref 27.0–33.0)
MCHC: 33.2 g/dL (ref 32.0–36.0)
MCV: 82.2 fL (ref 80.0–100.0)
MPV: 10 fL (ref 7.5–12.5)
Monocytes Relative: 5.9 %
Neutro Abs: 7484 cells/uL (ref 1500–7800)
Neutrophils Relative %: 69.3 %
Platelets: 314 10*3/uL (ref 140–400)
RBC: 4.84 10*6/uL (ref 3.80–5.10)
RDW: 15.7 % — ABNORMAL HIGH (ref 11.0–15.0)
Total Lymphocyte: 21.6 %
WBC: 10.8 10*3/uL (ref 3.8–10.8)

## 2020-03-03 LAB — HEMOGLOBIN A1C
Hgb A1c MFr Bld: 5.9 % of total Hgb — ABNORMAL HIGH (ref <5.7)
Mean Plasma Glucose: 123 (calc)
eAG (mmol/L): 6.8 (calc)

## 2020-03-03 LAB — LIPID PANEL
Cholesterol: 155 mg/dL (ref <200)
HDL: 46 mg/dL — ABNORMAL LOW (ref >=50)
LDL Cholesterol (Calc): 83 mg/dL (calc)
Non-HDL Cholesterol (Calc): 109 mg/dL (calc) (ref <130)
Total CHOL/HDL Ratio: 3.4 (calc) (ref <5.0)
Triglycerides: 166 mg/dL — ABNORMAL HIGH (ref <150)

## 2020-03-03 LAB — HEPATITIS C ANTIBODY
Hepatitis C Ab: NONREACTIVE
SIGNAL TO CUT-OFF: 0.01 (ref <1.00)

## 2020-03-03 LAB — COMPLETE METABOLIC PANEL WITH GFR
AG Ratio: 1.5 (calc) (ref 1.0–2.5)
ALT: 42 U/L — ABNORMAL HIGH (ref 6–29)
AST: 25 U/L (ref 10–30)
Albumin: 4.3 g/dL (ref 3.6–5.1)
Alkaline phosphatase (APISO): 107 U/L (ref 31–125)
BUN: 13 mg/dL (ref 7–25)
CO2: 26 mmol/L (ref 20–32)
Calcium: 9.3 mg/dL (ref 8.6–10.2)
Chloride: 103 mmol/L (ref 98–110)
Creat: 0.73 mg/dL (ref 0.50–1.10)
GFR, Est African American: 119 mL/min/{1.73_m2} (ref >=60)
GFR, Est Non African American: 103 mL/min/{1.73_m2} (ref >=60)
Globulin: 2.8 g/dL (calc) (ref 1.9–3.7)
Glucose, Bld: 84 mg/dL (ref 65–99)
Potassium: 4.2 mmol/L (ref 3.5–5.3)
Sodium: 138 mmol/L (ref 135–146)
Total Bilirubin: 0.3 mg/dL (ref 0.2–1.2)
Total Protein: 7.1 g/dL (ref 6.1–8.1)

## 2020-03-03 LAB — IRON,TIBC AND FERRITIN PANEL
%SAT: 12 % (calc) — ABNORMAL LOW (ref 16–45)
Ferritin: 47 ng/mL (ref 16–154)
Iron: 61 ug/dL (ref 40–190)
TIBC: 498 mcg/dL (calc) — ABNORMAL HIGH (ref 250–450)

## 2020-03-03 MED ORDER — TRAZODONE HCL 50 MG PO TABS: 25.0000 mg | ORAL_TABLET | Freq: Every evening | ORAL | 0 refills | Status: DC | PRN

## 2020-03-26 ENCOUNTER — Other Ambulatory Visit: Payer: Self-pay | Admitting: Family Medicine

## 2020-03-26 DIAGNOSIS — G4726 Circadian rhythm sleep disorder, shift work type: Secondary | ICD-10-CM

## 2020-03-26 NOTE — Telephone Encounter (Signed)
Requested medication (s) are due for refill today:  Yes  Requested medication (s) are on the active medication list:  Yes  Future visit scheduled:  Yes  Last Refill: 03/03/20; #30; no refills  Notes to clinic:  Pt. is requesting a 90 day supply.  Please make recommendation if pt. should have 90 day supply for a sleeping aide.  Also, Pharmacy is requesting a dx. code for this medication.   Requested Prescriptions  Pending Prescriptions Disp Refills   traZODone (DESYREL) 50 MG tablet [Pharmacy Med Name: TRAZODONE 50 MG TABLET] 90 tablet 1    Sig: Take 0.5-1 tablets (25-50 mg total) by mouth at bedtime as needed for sleep.      Psychiatry: Antidepressants - Serotonin Modulator Passed - 03/26/2020  2:32 PM      Passed - Valid encounter within last 6 months    Recent Outpatient Visits           3 weeks ago Morbid obesity [facility])   [facility] Steele Sizer, MD   2 months ago No-show for appointment   [facility] Steele Sizer, MD   1 year ago Closed nondisplaced fracture of distal phalanx of lesser toe of right foot with routine healing, subsequent encounter   [place], Satira Anis, MD   1 year ago Acute non-recurrent maxillary sinusitis   [facility] [place], Satira Anis, MD   1 year ago Toe pain, left   [place], Satira Anis, MD       Future Appointments             In 5 months Ancil Boozer, Drue Stager, MD [facility], [facility]

## 2020-03-26 NOTE — Telephone Encounter (Signed)
Refill request for general medication.  Last office visit:03/02/20  No follow-ups on file.

## 2020-03-27 NOTE — Telephone Encounter (Signed)
To Sowles - she last prescribed and pt sees her and has f/up appt

## 2020-03-31 ENCOUNTER — Encounter: Payer: Self-pay | Admitting: Family Medicine

## 2020-04-03 ENCOUNTER — Ambulatory Visit: Payer: 59 | Attending: Oncology

## 2020-04-03 DIAGNOSIS — Z23 Encounter for immunization: Secondary | ICD-10-CM

## 2020-04-03 NOTE — Progress Notes (Signed)
   Covid-19 Vaccination Clinic  Name:  Connie Sims    MRN: [id number] DOB: 1981-04-02  04/03/2020  [name] was observed post Covid-19 immunization for 15 minutes without incident. She was provided with Vaccine Information Sheet and instruction to access the V-Safe system.   [name] was instructed to call 911 with any severe reactions post vaccine: Marland Kitchen Difficulty breathing  . Swelling of face and throat  . A fast heartbeat  . A bad rash all over body  . Dizziness and weakness   Immunizations Administered    Name Date Dose VIS Date Route   Pfizer COVID-19 Vaccine 04/03/2020 11:10 AM 0.3 mL 12/06/2019 Intramuscular   Manufacturer: [place]   Lot: [id number]   [place]: [id number]

## 2020-04-24 ENCOUNTER — Encounter: Payer: Self-pay | Admitting: Family Medicine

## 2020-04-24 NOTE — Telephone Encounter (Signed)
I have never seen pt - you have seen several times, please advise

## 2020-04-29 ENCOUNTER — Ambulatory Visit: Payer: 59 | Attending: Internal Medicine

## 2020-04-29 DIAGNOSIS — Z23 Encounter for immunization: Secondary | ICD-10-CM

## 2020-04-29 NOTE — Progress Notes (Signed)
   Covid-19 Vaccination Clinic  Name:  Connie Sims    MRN: [id number] DOB: September 23, 1981  04/29/2020  Connie Sims was observed post Covid-19 immunization for 15 minutes without incident. She was provided with Vaccine Information Sheet and instruction to access the V-Safe system.   Connie Sims was instructed to call 911 with any severe reactions post vaccine: Marland Kitchen Difficulty breathing  . Swelling of face and throat  . A fast heartbeat  . A bad rash all over body  . Dizziness and weakness   Immunizations Administered    Name Date Dose VIS Date Route   Pfizer COVID-19 Vaccine 04/29/2020 11:01 AM 0.3 mL 02/19/2019 Intramuscular   Manufacturer: [employer]   Lot: [id number]   [place]: [id number]

## 2020-05-15 ENCOUNTER — Other Ambulatory Visit: Payer: Self-pay

## 2020-05-15 ENCOUNTER — Ambulatory Visit (INDEPENDENT_AMBULATORY_CARE_PROVIDER_SITE_OTHER): Payer: 59 | Admitting: Family Medicine

## 2020-05-15 ENCOUNTER — Encounter: Payer: Self-pay | Admitting: Family Medicine

## 2020-05-15 VITALS — BP 122/70 | HR 98 | Temp 97.9°F | Resp 18 | Ht 66.0 in | Wt 266.5 lb

## 2020-05-15 DIAGNOSIS — E781 Pure hyperglyceridemia: Secondary | ICD-10-CM

## 2020-05-15 DIAGNOSIS — K219 Gastro-esophageal reflux disease without esophagitis: Secondary | ICD-10-CM

## 2020-05-15 DIAGNOSIS — E8881 Metabolic syndrome: Secondary | ICD-10-CM | POA: Diagnosis not present

## 2020-05-15 DIAGNOSIS — G4726 Circadian rhythm sleep disorder, shift work type: Secondary | ICD-10-CM

## 2020-05-15 DIAGNOSIS — E559 Vitamin D deficiency, unspecified: Secondary | ICD-10-CM

## 2020-05-15 DIAGNOSIS — R7301 Impaired fasting glucose: Secondary | ICD-10-CM

## 2020-05-15 DIAGNOSIS — E785 Hyperlipidemia, unspecified: Secondary | ICD-10-CM

## 2020-05-15 MED ORDER — OZEMPIC (0.25 OR 0.5 MG/DOSE) 2 MG/1.5ML ~~LOC~~ SOPN: 0.5000 mg | PEN_INJECTOR | SUBCUTANEOUS | 2 refills | Status: DC

## 2020-05-15 MED ORDER — PANTOPRAZOLE SODIUM 40 MG PO TBEC: 40.0000 mg | DELAYED_RELEASE_TABLET | Freq: Every day | ORAL | 0 refills | Status: DC

## 2020-05-15 MED ORDER — PANTOPRAZOLE SODIUM 40 MG PO TBEC: 40.0000 mg | DELAYED_RELEASE_TABLET | Freq: Every day | ORAL | 3 refills | Status: DC

## 2020-05-15 MED ORDER — TEMAZEPAM 15 MG PO CAPS: 15.0000 mg | ORAL_CAPSULE | Freq: Every evening | ORAL | 0 refills | Status: DC | PRN

## 2020-05-15 NOTE — Progress Notes (Signed)
Name: CAROLAN LIFE   MRN: [id number]    DOB: 1981-03-19   Date:05/15/2020       Progress Note  Subjective  Chief Complaint  Chief Complaint  Patient presents with  . Gastroesophageal Reflux  . Insomnia  . Obesity    HPI  Metabolic Syndrome: she has a long history of PCO's , A1C elevation, triglycerides elevation and also fatty liver. She tried Metformin but caused GI symptoms, switched to Actos last year and we will recheck A1C, we started her on Ozempic March 2021 and has lost 5 lbs. Denies polyphagia, polydipsia or polyuria   Morbid obesity: she has always been overweight ( childhood), but worse since age 55 - she went from a size 12/14 to a size 20 ( even though she was in a marching band in college) She has decreased sodas intake from 40 oz to about 16 oz per day.  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 She is not exercising yet . She is now taking Ozempic once weekly, denies side effects except that she no longer feels hungry, still eating a lot of bread /. Sandwiches and not enough fiber.   Vitamin D : she does not like taking medications, taking rx vitamin D weekly   GERD: she used take Ranitidine and symptoms used to be controlled, however recently taking Pepcid and or Omeprazole otc and has severe symptoms around 5 am, wakes up with burning sensation on her throat, she needs to get out of bed and drink milk or take Tums to help with symptoms. She does not eat spicy food, she is down on sodas from 20-40 oz a day to 12-16 oz daily, not eating before bed time. She does not drink alcohol. Discussed raising the head of her bed with a wedge foam. She denies nausea, epigastric pain, no change in bowel movements of blood in stools. We will try rx PPI 30 minutes before dinner   Anemia unspecified: reviewed last labs, she has not changed her diet yet, she used to take ferrous sulfate , she will try MVI with iron   Sleep disorder:  she tried trazodone and it helped her fall asleep but not stay asleep. She is working on Publishing rights manager. She has white sound She takes benadryl and melatonin without help She used to work 2nd shift and is now working from noon to 8-9 pm   Patient Active Problem List   Diagnosis Date Noted  . Right ovarian cyst 06/27/2018  . Uterine leiomyoma 06/14/2018  . Wheat intolerance 02/15/2018  . Dysfunctional uterine bleeding 06/29/2017  . Highly echogenic liver on ultrasound 06/15/2017  . PLMD (periodic limb movement disorder) 02/01/2016  . Microcytosis 02/01/2016  . Vitamin D deficiency 02/01/2016  . Elevated alanine aminotransferase (ALT) level 02/01/2016  . Hypertriglyceridemia 10/23/2015  . 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  . PCO (polycystic ovaries)   . Allergic rhinitis   . Morbid obesity ([place])   . Insomnia   . Low back pain   . Impaired fasting glucose     Past Surgical History:  Procedure Laterality Date  . NASAL SINUS SURGERY      Family History  Problem Relation Age of Onset  . Liver disease Mother        fatty liver, s/p liver biopsy  . Alcohol abuse Mother   . Hypertension Maternal Grandmother   . Glaucoma Maternal Grandmother   . Diabetes Maternal Grandfather   .  Heart disease Maternal Grandfather   . Breast cancer Neg Hx   . Ovarian cancer Neg Hx   . Colon cancer Neg Hx     Social History   Tobacco Use  . Smoking status: Never Smoker  . Smokeless tobacco: Never Used  Substance Use Topics  . Alcohol use: No    Alcohol/week: 0.0 standard drinks     Current Outpatient Medications:  .  norgestimate-ethinyl estradiol (SPRINTEC 28) 0.25-35 MG-MCG tablet, PLEASE SEE ATTACHED FOR DETAILED DIRECTIONS, Disp: 84 tablet, Rfl: 1 .  Semaglutide,0.25 or 0.5MG /DOS, (OZEMPIC, 0.25 OR 0.5 MG/DOSE,) 2 MG/1.5ML SOPN, Inject 0.5 mg into the skin once a week., Disp: 2 pen, Rfl: 2 .  traZODone (DESYREL) 50 MG tablet, TAKE 0.5-1 TABLETS (25-50 MG TOTAL) BY MOUTH AT BEDTIME AS NEEDED FOR  SLEEP., Disp: 90 tablet, Rfl: 1 .  Vitamin D, Ergocalciferol, (DRISDOL) 1.25 MG (50000 UNIT) CAPS capsule, Take 1 capsule (50,000 Units total) by mouth every 7 (seven) days., Disp: 12 capsule, Rfl: 3 .  Green Tea, Camellia sinensis, (GREEN TEA EXTRACT PO), Take by mouth., Disp: , Rfl:   No Known Allergies  I personally reviewed active problem list, medication list, allergies, family history, social history, health maintenance with the patient/caregiver today.   ROS  Constitutional: Negative for fever or weight change.  Respiratory: Negative for cough and shortness of breath.   Cardiovascular: Negative for chest pain or palpitations.  Gastrointestinal: Negative for abdominal pain, no bowel changes.  Musculoskeletal: Negative for gait problem or joint swelling.  Skin: Negative for rash.  Neurological: Negative for dizziness or headache.  No other specific complaints in a complete review of systems (except as listed in HPI above).   Objective  Vitals:   05/15/20 1054  BP: 122/70  Pulse: 98  Resp: 18  Temp: 97.9 F (36.6 C)  TempSrc: Temporal  SpO2: 93%  Weight: 266 lb 8 oz (120.9 kg)  Height: 5\' 6"  (1.676 m)    Body mass index is 43.01 kg/m.  Physical Exam  Constitutional: Patient appears well-developed and well-nourished. Obese  No distress.  HEENT: head atraumatic, normocephalic, pupils equal and reactive to light, neck supple, throat within normal limits Cardiovascular: Normal rate, regular rhythm and normal heart sounds.  No murmur heard. No BLE edema. Pulmonary/Chest: Effort normal and breath sounds normal. No respiratory distress. Abdominal: Soft.  There is no tenderness. Psychiatric: Patient has a normal mood and affect. behavior is normal. Judgment and thought content normal.   Recent Results (from the past 2160 hour(s))  CBC with Differential/Platelet     Status: Abnormal   Collection Time: 03/02/20 10:24 AM  Result Value Ref Range   WBC 10.8 3.8 - 10.8  Thousand/uL   RBC 4.84 3.80 - 5.10 Million/uL   Hemoglobin 13.2 11.7 - 15.5 g/dL   HCT 39.8 35.0 - 45.0 %   MCV 82.2 80.0 - 100.0 fL   MCH 27.3 27.0 - 33.0 pg   MCHC 33.2 32.0 - 36.0 g/dL   RDW 15.7 (H) 11.0 - 15.0 %   Platelets 314 140 - 400 Thousand/uL   MPV 10.0 7.5 - 12.5 fL   Neutro Abs 7,484 1,500 - 7,800 cells/uL   Lymphs Abs 2,333 850 - 3,900 cells/uL   Absolute Monocytes 637 200 - 950 cells/uL   Eosinophils Absolute 281 15 - 500 cells/uL   Basophils Absolute 65 0 - 200 cells/uL   Neutrophils Relative % 69.3 %   Total Lymphocyte 21.6 %   Monocytes Relative 5.9 %  Eosinophils Relative 2.6 %   Basophils Relative 0.6 %  COMPLETE METABOLIC PANEL WITH GFR     Status: Abnormal   Collection Time: 03/02/20 10:24 AM  Result Value Ref Range   Glucose, Bld 84 65 - 99 mg/dL    Comment: .            Fasting reference interval .    BUN 13 7 - 25 mg/dL   Creat 0.73 0.50 - 1.10 mg/dL   GFR, Est Non African American 103 > OR = 60 mL/min/1.74m2   GFR, Est African American 119 > OR = 60 mL/min/1.86m2   BUN/Creatinine Ratio NOT APPLICABLE 6 - 22 (calc)   Sodium 138 135 - 146 mmol/L   Potassium 4.2 3.5 - 5.3 mmol/L   Chloride 103 98 - 110 mmol/L   CO2 26 20 - 32 mmol/L   Calcium 9.3 8.6 - 10.2 mg/dL   Total Protein 7.1 6.1 - 8.1 g/dL   Albumin 4.3 3.6 - 5.1 g/dL   Globulin 2.8 1.9 - 3.7 g/dL (calc)   AG Ratio 1.5 1.0 - 2.5 (calc)   Total Bilirubin 0.3 0.2 - 1.2 mg/dL   Alkaline phosphatase (APISO) 107 31 - 125 U/L   AST 25 10 - 30 U/L   ALT 42 (H) 6 - 29 U/L  Lipid panel     Status: Abnormal   Collection Time: 03/02/20 10:24 AM  Result Value Ref Range   Cholesterol 155 <200 mg/dL   HDL 46 (L) > OR = 50 mg/dL   Triglycerides 166 (H) <150 mg/dL   LDL Cholesterol (Calc) 83 mg/dL (calc)    Comment: Reference range: <100 . Desirable range <100 mg/dL for primary prevention;   <70 mg/dL for patients with CHD or diabetic patients  with > or = 2 CHD risk factors. Marland Kitchen LDL-C is now  calculated using the Martin-Hopkins  calculation, which is a validated novel method providing  better accuracy than the Friedewald equation in the  estimation of LDL-C.  Cresenciano Genre et al. Annamaria Helling. [id number]): 2061-2068  (http://education.[facility].com/faq/FAQ164)    Total CHOL/HDL Ratio 3.4 <5.0 (calc)   Non-HDL Cholesterol (Calc) 109 <130 mg/dL (calc)    Comment: For patients with diabetes plus 1 major ASCVD risk  factor, treating to a non-HDL-C goal of <100 mg/dL  (LDL-C of <70 mg/dL) is considered a therapeutic  option.   Hemoglobin A1c     Status: Abnormal   Collection Time: 03/02/20 10:24 AM  Result Value Ref Range   Hgb A1c MFr Bld 5.9 (H) <5.7 % of total Hgb    Comment: For someone without known diabetes, a hemoglobin  A1c value between 5.7% and 6.4% is consistent with prediabetes and should be confirmed with a  follow-up test. . For someone with known diabetes, a value <7% indicates that their diabetes is well controlled. A1c targets should be individualized based on duration of diabetes, age, comorbid conditions, and other considerations. . This assay result is consistent with an increased risk of diabetes. . Currently, no consensus exists regarding use of hemoglobin A1c for diagnosis of diabetes for children. .    Mean Plasma Glucose 123 (calc)   eAG (mmol/L) 6.8 (calc)  Hepatitis C antibody     Status: None   Collection Time: 03/02/20 10:24 AM  Result Value Ref Range   Hepatitis C Ab NON-REACTIVE NON-REACTI   SIGNAL TO CUT-OFF 0.01 <1.00    Comment: . HCV antibody was non-reactive. There is no laboratory  evidence of HCV  infection. . In most cases, no further action is required. However, if recent HCV exposure is suspected, a test for HCV RNA (test code [phone]) is suggested. . For additional information please refer to http://education.[facility].com/faq/FAQ22v1 (This link is being provided for informational/ educational purposes only.) .    Iron, TIBC and Ferritin Panel     Status: Abnormal   Collection Time: 03/02/20 10:24 AM  Result Value Ref Range   Iron 61 40 - 190 mcg/dL   TIBC 498 (H) 250 - 450 mcg/dL (calc)   %SAT 12 (L) 16 - 45 % (calc)   Ferritin 47 16 - 154 ng/mL     PHQ2/9: Depression screen [facility] 2/9 05/15/2020 03/02/2020 01/08/2019 07/23/2018 06/19/2018  Decreased Interest 0 0 0 0 0  Down, Depressed, Hopeless 0 0 0 0 0  PHQ - 2 Score 0 0 0 0 0  Altered sleeping 2 3 0 - -  Tired, decreased energy 0 0 0 - -  Change in appetite 0 0 0 - -  Feeling bad or failure about yourself  0 0 0 - -  Trouble concentrating 0 0 0 - -  Moving slowly or fidgety/restless 0 0 0 - -  Suicidal thoughts 0 0 0 - -  PHQ-9 Score 2 3 0 - -  Difficult doing work/chores Not difficult at all Not difficult at all Not difficult at all - -    phq 9 is negative  Fall Risk: Fall Risk  05/15/2020 03/02/2020 09/19/2018 07/23/2018 06/19/2018  Falls in the past year? 0 0 No No No  Number falls in past yr: 0 0 - - -  Injury with Fall? 0 0 - - -  Follow up Falls evaluation completed Falls evaluation completed - - -     Functional Status Survey: Is the patient deaf or have difficulty hearing?: No Does the patient have difficulty seeing, even when wearing glasses/contacts?: No Does the patient have difficulty concentrating, remembering, or making decisions?: No Does the patient have difficulty walking or climbing stairs?: No Does the patient have difficulty dressing or bathing?: No Does the patient have difficulty doing errands alone such as visiting a doctor's office or shopping?: No   Assessment & Plan  1. Morbid obesity ([place])  - Semaglutide,0.25 or 0.5MG /DOS, (OZEMPIC, 0.25 OR 0.5 MG/DOSE,) 2 MG/1.5ML SOPN; Inject 0.5 mg into the skin once a week.  Dispense: 2 pen; Refill: 2  2. Metabolic syndrome  - 0000000 or 0.5MG /DOS, (OZEMPIC, 0.25 OR 0.5 MG/DOSE,) 2 MG/1.5ML SOPN; Inject 0.5 mg into the skin once a week.  Dispense: 2 pen;  Refill: 2  3. Vitamin D deficiency  Continue supplementation   4. Hypertriglyceridemia  Discussed life style modification   5. Dyslipidemia   6. Circadian rhythm sleep disorder, shift work type  - temazepam (RESTORIL) 15 MG capsule; Take 1 capsule (15 mg total) by mouth at bedtime as needed for sleep.  Dispense: 30 capsule; Refill: 0  7. Impaired fasting glucose  - Semaglutide,0.25 or 0.5MG /DOS, (OZEMPIC, 0.25 OR 0.5 MG/DOSE,) 2 MG/1.5ML SOPN; Inject 0.5 mg into the skin once a week.  Dispense: 2 pen; Refill: 2.  8. Gastroesophageal reflux disease without esophagitis  - pantoprazole (PROTONIX) 40 MG tablet; Take 1 tablet (40 mg total) by mouth daily.  Dispense: 90 tablet; Refill: 0

## 2020-06-03 ENCOUNTER — Other Ambulatory Visit: Payer: Self-pay | Admitting: Family Medicine

## 2020-06-03 ENCOUNTER — Encounter: Payer: Self-pay | Admitting: Family Medicine

## 2020-06-03 DIAGNOSIS — K219 Gastro-esophageal reflux disease without esophagitis: Secondary | ICD-10-CM

## 2020-06-03 MED ORDER — PANTOPRAZOLE SODIUM 40 MG PO TBEC: 40.0000 mg | DELAYED_RELEASE_TABLET | Freq: Two times a day (BID) | ORAL | 0 refills | Status: DC

## 2020-06-25 ENCOUNTER — Ambulatory Visit (INDEPENDENT_AMBULATORY_CARE_PROVIDER_SITE_OTHER): Payer: 59 | Admitting: Family Medicine

## 2020-06-25 ENCOUNTER — Encounter: Payer: Self-pay | Admitting: Family Medicine

## 2020-06-25 ENCOUNTER — Other Ambulatory Visit: Payer: Self-pay

## 2020-06-25 VITALS — BP 102/70 | HR 87 | Temp 96.9°F | Resp 16 | Ht 66.0 in | Wt 262.6 lb

## 2020-06-25 DIAGNOSIS — G4700 Insomnia, unspecified: Secondary | ICD-10-CM

## 2020-06-25 DIAGNOSIS — N938 Other specified abnormal uterine and vaginal bleeding: Secondary | ICD-10-CM | POA: Diagnosis not present

## 2020-06-25 DIAGNOSIS — G2581 Restless legs syndrome: Secondary | ICD-10-CM

## 2020-06-25 DIAGNOSIS — D509 Iron deficiency anemia, unspecified: Secondary | ICD-10-CM

## 2020-06-25 DIAGNOSIS — E559 Vitamin D deficiency, unspecified: Secondary | ICD-10-CM

## 2020-06-25 DIAGNOSIS — K219 Gastro-esophageal reflux disease without esophagitis: Secondary | ICD-10-CM | POA: Diagnosis not present

## 2020-06-25 DIAGNOSIS — D259 Leiomyoma of uterus, unspecified: Secondary | ICD-10-CM | POA: Diagnosis not present

## 2020-06-25 DIAGNOSIS — R202 Paresthesia of skin: Secondary | ICD-10-CM

## 2020-06-25 DIAGNOSIS — Z3041 Encounter for surveillance of contraceptive pills: Secondary | ICD-10-CM

## 2020-06-25 LAB — POCT URINE PREGNANCY: Preg Test, Ur: NEGATIVE

## 2020-06-25 MED ORDER — PANTOPRAZOLE SODIUM 40 MG PO TBEC: 40.0000 mg | DELAYED_RELEASE_TABLET | Freq: Two times a day (BID) | ORAL | 1 refills | Status: DC

## 2020-06-25 MED ORDER — NORGESTIMATE-ETH ESTRADIOL 0.25-35 MG-MCG PO TABS: ORAL_TABLET | ORAL | 3 refills | Status: DC

## 2020-06-25 NOTE — Progress Notes (Signed)
Patient ID: BRALEIGH Sims, female    DOB: Dec 06, 1981, 39 y.o.   MRN: [id number]  PCP: Delsa Grana, PA-C  Chief Complaint  Patient presents with  . Referral    Heartburn  . Insomnia    Unable to stay sleep  . Medication Refill    Birth Control     Subjective:   Connie Sims is a 39 y.o. female, presents to clinic with CC of the following:  HPI   Pt returns for f/up on GERD sx, she saw Dr. Ancil Boozer a few times over the past couple months  Pharmacy would not fill protonix last refill, looks like dose was changed from 40 mg daily to BID  The reflux improved -she is having less food reflux up to her neck but she continues to have consistent and fairly severe epigastric to chest burning,  shes trying pepcid and it helps a little, she denies any problems when she used to take Zantac when it got off the market is when she developed severe symptoms  0.5 mg ozempic - weekly decreased PO intake -previously thought with Dr. Ancil Boozer that Ozempic was not a factor in her GI symptoms Wt Readings from Last 5 Encounters:  06/25/20 262 lb 9.6 oz (119.1 kg)  05/15/20 266 lb 8 oz (120.9 kg)  03/02/20 272 lb 1.6 oz (123.4 kg)  01/08/19 260 lb 11.2 oz (118.3 kg)  09/19/18 250 lb 14.4 oz (113.8 kg)   BMI Readings from Last 5 Encounters:  06/25/20 42.38 kg/m  05/15/20 43.01 kg/m  03/02/20 43.92 kg/m  01/08/19 43.38 kg/m  09/19/18 40.50 kg/m   Patient reports difficulty sleeping due to leg symptoms that wake her from sleep she is having success getting to sleep, she was given Restoril but only a 30-day supply, this was helpful for her but she is not sure why it was not refilled.  She does have history of dysfunctional uterine bleeding, uterine polyps with heavy blood flow and severe cramping.  Symptoms have been worse since she ran out of birth control over a year ago.  She states that she has not been sexually active for over 8 months and there is no possibility that she is pregnant.   She would like to restart her same birth control pills today because her menses were more regulated, less severe cramping and less bleeding.   Patient also has history of microcytosis, vitamin D deficiency, impaired fasting glucose, low back pain, and a diagnosis of periodic limb movement disorder.    Patient Active Problem List   Diagnosis Date Noted  . Right ovarian cyst 06/27/2018  . Uterine leiomyoma 06/14/2018  . Wheat intolerance 02/15/2018  . Dysfunctional uterine bleeding 06/29/2017  . Highly echogenic liver on ultrasound 06/15/2017  . PLMD (periodic limb movement disorder) 02/01/2016  . Microcytosis 02/01/2016  . Vitamin D deficiency 02/01/2016  . Elevated alanine aminotransferase (ALT) level 02/01/2016  . Hypertriglyceridemia 10/23/2015  . 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  . PCO (polycystic ovaries)   . Allergic rhinitis   . Morbid obesity ([place])   . Insomnia   . Low back pain   . Impaired fasting glucose       Current Outpatient Medications:  .  Semaglutide,0.25 or 0.5MG /DOS, (OZEMPIC, 0.25 OR 0.5 MG/DOSE,) 2 MG/1.5ML SOPN, Inject 0.5 mg into the skin once a week., Disp: 2 pen, Rfl: 2 .  Green Tea, Camellia sinensis, (GREEN TEA EXTRACT PO), Take by mouth. (Patient not taking: Reported on 06/25/2020), Disp: , Rfl:  .  norgestimate-ethinyl estradiol (SPRINTEC 28) 0.25-35 MG-MCG tablet, PLEASE SEE ATTACHED FOR DETAILED DIRECTIONS (Patient not taking: Reported on 06/25/2020), Disp: 84 tablet, Rfl: 1 .  pantoprazole (PROTONIX) 40 MG tablet, Take 1 tablet (40 mg total) by mouth 2 (two) times daily. (Patient not taking: Reported on 06/25/2020), Disp: 60 tablet, Rfl: 0 .  temazepam (RESTORIL) 15 MG capsule, Take 1 capsule (15 mg total) by mouth at bedtime as needed for sleep. (Patient not taking: Reported on 06/25/2020), Disp: 30 capsule, Rfl: 0 .  Vitamin D, Ergocalciferol, (DRISDOL) 1.25 MG (50000 UNIT) CAPS capsule, Take 1 capsule (50,000 Units total) by mouth every 7 (seven) days. (Patient not  taking: Reported on 06/25/2020), Disp: 12 capsule, Rfl: 3   No Known Allergies   Social History   Tobacco Use  . Smoking status: Never Smoker  . Smokeless tobacco: Never Used  Vaping Use  . Vaping Use: Never used  Substance Use Topics  . Alcohol use: No    Alcohol/week: 0.0 standard drinks  . Drug use: No      Chart Review Today: I personally reviewed active problem list, medication list, allergies, family history, social history, health maintenance, notes from last encounter, lab results, imaging with the patient/caregiver today.   Review of Systems 10 Systems reviewed and are negative for acute change except as noted in the HPI.     Objective:   Vitals:   06/25/20 1034 06/25/20 1050  BP: 90/60 102/70  Pulse: 87   Resp: 16   Temp: (!) 96.9 F (36.1 C)   SpO2: 97%   Weight: 262 lb 9.6 oz (119.1 kg)   Height: 5\' 6"  (1.676 m)     Body mass index is 42.38 kg/m.  Physical Exam Vitals and nursing note reviewed.  Constitutional:      General: She is not in acute distress.    Appearance: Normal appearance. She is obese. She is not ill-appearing, toxic-appearing or diaphoretic.  HENT:     Head: Normocephalic and atraumatic.     Right Ear: External ear normal.     Left Ear: External ear normal.  Eyes:     General:        Right eye: No discharge.        Left eye: No discharge.     Conjunctiva/sclera: Conjunctivae normal.  Cardiovascular:     Rate and Rhythm: Normal rate and regular rhythm.     Pulses: Normal pulses.     Heart sounds: Normal heart sounds. No murmur heard.  No friction rub. No gallop.   Pulmonary:     Effort: Pulmonary effort is normal. No respiratory distress.     Breath sounds: Normal breath sounds. No stridor. No wheezing.  Abdominal:     General: Bowel sounds are normal. There is no distension.     Palpations: Abdomen is soft.     Tenderness: There is no abdominal tenderness.  Musculoskeletal:        General: Tenderness (out of proportion  to exam with palpation to mid low back) present.     Right lower leg: Edema present.     Left lower leg: Edema present.  Skin:    Capillary Refill: Capillary refill takes less than 2 seconds.     Coloration: Skin is pale (mild pallor, skin tone fair to pale). Skin is not jaundiced.  Neurological:     Mental Status: She is alert. Mental status is at baseline.     Cranial Nerves: No cranial nerve deficit, dysarthria or facial  asymmetry.     Sensory: Sensation is intact.     Motor: No weakness, tremor or abnormal muscle tone.     Coordination: Coordination normal.     Gait: Gait normal.  Psychiatric:        Attention and Perception: Attention normal.        Mood and Affect: Affect is flat.        Speech: Speech is delayed.        Behavior: Behavior normal. Behavior is cooperative.        Thought Content: Thought content normal.      Results for orders placed or performed in visit on 03/02/20  CBC with Differential/Platelet  Result Value Ref Range   WBC 10.8 3.8 - 10.8 Thousand/uL   RBC 4.84 3.80 - 5.10 Million/uL   Hemoglobin 13.2 11.7 - 15.5 g/dL   HCT 39.8 35 - 45 %   MCV 82.2 80.0 - 100.0 fL   MCH 27.3 27.0 - 33.0 pg   MCHC 33.2 32.0 - 36.0 g/dL   RDW 15.7 (H) 11.0 - 15.0 %   Platelets 314 140 - 400 Thousand/uL   MPV 10.0 7.5 - 12.5 fL   Neutro Abs 7,484 1,500 - 7,800 cells/uL   Lymphs Abs 2,333 850 - 3,900 cells/uL   Absolute Monocytes 637 200 - 950 cells/uL   Eosinophils Absolute 281 15 - 500 cells/uL   Basophils Absolute 65 0 - 200 cells/uL   Neutrophils Relative % 69.3 %   Total Lymphocyte 21.6 %   Monocytes Relative 5.9 %   Eosinophils Relative 2.6 %   Basophils Relative 0.6 %  COMPLETE METABOLIC PANEL WITH GFR  Result Value Ref Range   Glucose, Bld 84 65 - 99 mg/dL   BUN 13 7 - 25 mg/dL   Creat 0.73 0.50 - 1.10 mg/dL   GFR, Est Non African American 103 > OR = 60 mL/min/1.34m2   GFR, Est African American 119 > OR = 60 mL/min/1.66m2   BUN/Creatinine Ratio NOT  APPLICABLE 6 - 22 (calc)   Sodium 138 135 - 146 mmol/L   Potassium 4.2 3.5 - 5.3 mmol/L   Chloride 103 98 - 110 mmol/L   CO2 26 20 - 32 mmol/L   Calcium 9.3 8.6 - 10.2 mg/dL   Total Protein 7.1 6.1 - 8.1 g/dL   Albumin 4.3 3.6 - 5.1 g/dL   Globulin 2.8 1.9 - 3.7 g/dL (calc)   AG Ratio 1.5 1.0 - 2.5 (calc)   Total Bilirubin 0.3 0.2 - 1.2 mg/dL   Alkaline phosphatase (APISO) 107 31 - 125 U/L   AST 25 10 - 30 U/L   ALT 42 (H) 6 - 29 U/L  Lipid panel  Result Value Ref Range   Cholesterol 155 <200 mg/dL   HDL 46 (L) > OR = 50 mg/dL   Triglycerides 166 (H) <150 mg/dL   LDL Cholesterol (Calc) 83 mg/dL (calc)   Total CHOL/HDL Ratio 3.4 <5.0 (calc)   Non-HDL Cholesterol (Calc) 109 <130 mg/dL (calc)  Hemoglobin A1c  Result Value Ref Range   Hgb A1c MFr Bld 5.9 (H) <5.7 % of total Hgb   Mean Plasma Glucose 123 (calc)   eAG (mmol/L) 6.8 (calc)  Hepatitis C antibody  Result Value Ref Range   Hepatitis C Ab NON-REACTIVE NON-REACTI   SIGNAL TO CUT-OFF 0.01 <1.00  Iron, TIBC and Ferritin Panel  Result Value Ref Range   Iron 61 40 - 190 mcg/dL   TIBC 498 (H) 250 -  450 mcg/dL (calc)   %SAT 12 (L) 16 - 45 % (calc)   Ferritin 47 16 - 154 ng/mL   POC urine preg resulted neg, and reviewed same day of appt    Assessment & Plan:     ICD-10-CM   1. Gastroesophageal reflux disease without esophagitis  K21.9 pantoprazole (PROTONIX) 40 MG tablet    Ambulatory referral to Gastroenterology   Refill on Protonix 40 mg sent to the pharmacy for twice daily dosing emphasized the dose change patient can follow-up with GI  2. Insomnia, unspecified type  G47.00    Refill on her prior medications but I do think we need to work-up and address the etiology of her leg symptoms  3. Uterine leiomyoma, unspecified location  D25.9 POCT urine pregnancy    CBC with Differential/Platelet    Iron, TIBC and Ferritin Panel   Reestablished with GI and for further management  4. Dysfunctional uterine bleeding  N93.8  POCT urine pregnancy    CBC with Differential/Platelet    Iron, TIBC and Ferritin Panel   Restart birth control, reviewed risk versus benefit, patient verbalized understanding, encouraged her to follow-up with GYN for further management  5. Vitamin D deficiency  E55.9 VITAMIN D 25 Hydroxy (Vit-D Deficiency, Fractures)   History of deficiency not on any supplements recently, recheck labs  6. Iron deficiency anemia, unspecified iron deficiency anemia type  D50.9 CBC with Differential/Platelet    COMPLETE METABOLIC PANEL WITH GFR    Iron, TIBC and Ferritin Panel   Heavy menstrual bleeding with history of microcytosis, check CBC and iron panel today  7. Paresthesia  R20.2 CBC with Differential/Platelet    COMPLETE METABOLIC PANEL WITH GFR    TSH    Vitamin B12    Iron, TIBC and Ferritin Panel   Labs to evaluate potential causes of paresthesias, restless leg symptoms that are as noted above -eval hemoglobin, iron panel, B12 and electrolytes  8. RLS (restless legs syndrome)  G25.81 CBC with Differential/Platelet    COMPLETE METABOLIC PANEL WITH GFR    TSH    Vitamin B12    Iron, TIBC and Ferritin Panel   Patient may benefit from alternative medicines to treat her leg symptoms at night such as Lyrica or gabapentin?    9. Encounter for surveillance of contraceptive pills  Z30.41    Unlikely to be pregnant, point-of-care urine pregnancy test done and was negative today, blood pressure is low normal, she is 39 years old but not a smoker        Delsa Grana, PA-C 06/25/20 10:52 AM

## 2020-06-25 NOTE — Patient Instructions (Signed)
Protonix twice a day    Food Choices for Gastroesophageal Reflux Disease, Adult When you have gastroesophageal reflux disease (GERD), the foods you eat and your eating habits are very important. Choosing the right foods can help ease your discomfort. Think about working with a nutrition specialist (dietitian) to help you make good choices. What are tips for following this plan?  Meals  Choose healthy foods that are low in fat, such as fruits, vegetables, whole grains, low-fat dairy products, and lean meat, fish, and poultry.  Eat small meals often instead of 3 large meals a day. Eat your meals slowly, and in a place where you are relaxed. Avoid bending over or lying down until 2-3 hours after eating.  Avoid eating meals 2-3 hours before bed.  Avoid drinking a lot of liquid with meals.  Cook foods using methods other than frying. Bake, grill, or broil food instead.  Avoid or limit: ? Chocolate. ? Peppermint or spearmint. ? Alcohol. ? Pepper. ? Black and decaffeinated coffee. ? Black and decaffeinated tea. ? Bubbly (carbonated) soft drinks. ? Caffeinated energy drinks and soft drinks.  Limit high-fat foods such as: ? Fatty meat or fried foods. ? Whole milk, cream, butter, or ice cream. ? Nuts and nut butters. ? Pastries, donuts, and sweets made with butter or shortening.  Avoid foods that cause symptoms. These foods may be different for everyone. Common foods that cause symptoms include: ? Tomatoes. ? Oranges, lemons, and limes. ? Peppers. ? Spicy food. ? Onions and garlic. ? Vinegar. Lifestyle  Maintain a healthy weight. Ask your doctor what weight is healthy for you. If you need to lose weight, work with your doctor to do so safely.  Exercise for at least 30 minutes for 5 or more days each week, or as told by your doctor.  Wear loose-fitting clothes.  Do not smoke. If you need help quitting, ask your doctor.  Sleep with the head of your bed higher than your feet.  Use a wedge under the mattress or blocks under the bed frame to raise the head of the bed. Summary  When you have gastroesophageal reflux disease (GERD), food and lifestyle choices are very important in easing your symptoms.  Eat small meals often instead of 3 large meals a day. Eat your meals slowly, and in a place where you are relaxed.  Limit high-fat foods such as fatty meat or fried foods.  Avoid bending over or lying down until 2-3 hours after eating.  Avoid peppermint and spearmint, caffeine, alcohol, and chocolate. This information is not intended to replace advice given to you by your health care provider. Make sure you discuss any questions you have with your health care provider. Document Revised: 04/04/2019 Document Reviewed: 01/17/2017 Elsevier Patient Education  [place].

## 2020-06-26 ENCOUNTER — Other Ambulatory Visit: Payer: Self-pay

## 2020-06-26 ENCOUNTER — Other Ambulatory Visit: Payer: Self-pay | Admitting: Family Medicine

## 2020-06-26 ENCOUNTER — Encounter: Payer: Self-pay | Admitting: Family Medicine

## 2020-06-26 DIAGNOSIS — G4726 Circadian rhythm sleep disorder, shift work type: Secondary | ICD-10-CM

## 2020-06-26 DIAGNOSIS — E559 Vitamin D deficiency, unspecified: Secondary | ICD-10-CM

## 2020-06-26 DIAGNOSIS — Z3041 Encounter for surveillance of contraceptive pills: Secondary | ICD-10-CM

## 2020-06-26 LAB — COMPLETE METABOLIC PANEL WITH GFR
AG Ratio: 1.6 (calc) (ref 1.0–2.5)
ALT: 27 U/L (ref 6–29)
AST: 16 U/L (ref 10–30)
Albumin: 4.1 g/dL (ref 3.6–5.1)
Alkaline phosphatase (APISO): 99 U/L (ref 31–125)
BUN: 15 mg/dL (ref 7–25)
CO2: 27 mmol/L (ref 20–32)
Calcium: 9.1 mg/dL (ref 8.6–10.2)
Chloride: 103 mmol/L (ref 98–110)
Creat: 0.8 mg/dL (ref 0.50–1.10)
GFR, Est African American: 107 mL/min/{1.73_m2} (ref >=60)
GFR, Est Non African American: 92 mL/min/{1.73_m2} (ref >=60)
Globulin: 2.6 g/dL (calc) (ref 1.9–3.7)
Glucose, Bld: 94 mg/dL (ref 65–99)
Potassium: 4.3 mmol/L (ref 3.5–5.3)
Sodium: 136 mmol/L (ref 135–146)
Total Bilirubin: 0.3 mg/dL (ref 0.2–1.2)
Total Protein: 6.7 g/dL (ref 6.1–8.1)

## 2020-06-26 LAB — CBC WITH DIFFERENTIAL/PLATELET
Absolute Monocytes: 529 cells/uL (ref 200–950)
Basophils Absolute: 40 cells/uL (ref 0–200)
Basophils Relative: 0.5 %
Eosinophils Absolute: 182 cells/uL (ref 15–500)
Eosinophils Relative: 2.3 %
HCT: 38.2 % (ref 35.0–45.0)
Hemoglobin: 12.6 g/dL (ref 11.7–15.5)
Lymphs Abs: 1825 cells/uL (ref 850–3900)
MCH: 27 pg (ref 27.0–33.0)
MCHC: 33 g/dL (ref 32.0–36.0)
MCV: 81.8 fL (ref 80.0–100.0)
MPV: 9.8 fL (ref 7.5–12.5)
Monocytes Relative: 6.7 %
Neutro Abs: 5325 cells/uL (ref 1500–7800)
Neutrophils Relative %: 67.4 %
Platelets: 310 10*3/uL (ref 140–400)
RBC: 4.67 10*6/uL (ref 3.80–5.10)
RDW: 16 % — ABNORMAL HIGH (ref 11.0–15.0)
Total Lymphocyte: 23.1 %
WBC: 7.9 10*3/uL (ref 3.8–10.8)

## 2020-06-26 LAB — IRON,TIBC AND FERRITIN PANEL
%SAT: 14 % (calc) — ABNORMAL LOW (ref 16–45)
Ferritin: 37 ng/mL (ref 16–154)
Iron: 59 ug/dL (ref 40–190)
TIBC: 420 mcg/dL (calc) (ref 250–450)

## 2020-06-26 LAB — TSH: TSH: 1.43 mIU/L

## 2020-06-26 LAB — VITAMIN B12: Vitamin B-12: 436 pg/mL (ref 200–1100)

## 2020-06-26 LAB — VITAMIN D 25 HYDROXY (VIT D DEFICIENCY, FRACTURES): Vit D, 25-Hydroxy: 25 ng/mL — ABNORMAL LOW (ref 30–100)

## 2020-06-26 MED ORDER — NORGESTIMATE-ETH ESTRADIOL 0.25-35 MG-MCG PO TABS: 1.0000 | ORAL_TABLET | Freq: Every day | ORAL | 3 refills | Status: DC

## 2020-06-26 MED ORDER — TEMAZEPAM 15 MG PO CAPS: 15.0000 mg | ORAL_CAPSULE | Freq: Every evening | ORAL | 0 refills | Status: DC | PRN

## 2020-06-26 MED ORDER — VITAMIN D (ERGOCALCIFEROL) 1.25 MG (50000 UNIT) PO CAPS: 50000.0000 [IU] | ORAL_CAPSULE | ORAL | 3 refills | Status: DC

## 2020-06-26 NOTE — Telephone Encounter (Signed)
Needs directions, please resend

## 2020-06-26 NOTE — Telephone Encounter (Signed)
resent

## 2020-07-28 ENCOUNTER — Other Ambulatory Visit: Payer: Self-pay | Admitting: Family Medicine

## 2020-07-28 DIAGNOSIS — G4726 Circadian rhythm sleep disorder, shift work type: Secondary | ICD-10-CM

## 2020-08-10 ENCOUNTER — Other Ambulatory Visit: Payer: Self-pay | Admitting: Family Medicine

## 2020-08-10 DIAGNOSIS — Z1231 Encounter for screening mammogram for malignant neoplasm of breast: Secondary | ICD-10-CM

## 2020-08-14 ENCOUNTER — Ambulatory Visit: Payer: 59 | Admitting: Family Medicine

## 2020-08-21 ENCOUNTER — Ambulatory Visit
Admission: RE | Admit: 2020-08-21 | Discharge: 2020-08-21 | Disposition: A | Discharge status: Home / Self Care | Payer: 59 | Source: Ambulatory Visit

## 2020-08-21 ENCOUNTER — Other Ambulatory Visit: Payer: Self-pay

## 2020-08-21 DIAGNOSIS — Z1231 Encounter for screening mammogram for malignant neoplasm of breast: Secondary | ICD-10-CM

## 2020-08-26 ENCOUNTER — Other Ambulatory Visit: Payer: Self-pay | Admitting: Family Medicine

## 2020-08-26 DIAGNOSIS — R928 Other abnormal and inconclusive findings on diagnostic imaging of breast: Secondary | ICD-10-CM

## 2020-08-29 ENCOUNTER — Other Ambulatory Visit: Payer: Self-pay | Admitting: Family Medicine

## 2020-08-29 DIAGNOSIS — E8881 Metabolic syndrome: Secondary | ICD-10-CM

## 2020-08-29 DIAGNOSIS — R7301 Impaired fasting glucose: Secondary | ICD-10-CM

## 2020-09-02 ENCOUNTER — Ambulatory Visit: Payer: 59 | Admitting: Family Medicine

## 2020-09-11 ENCOUNTER — Other Ambulatory Visit: Payer: Self-pay | Admitting: Family Medicine

## 2020-09-11 ENCOUNTER — Ambulatory Visit
Admission: RE | Admit: 2020-09-11 | Discharge: 2020-09-11 | Disposition: A | Discharge status: Home / Self Care | Payer: 59 | Source: Ambulatory Visit | Attending: Family Medicine | Admitting: Family Medicine

## 2020-09-11 ENCOUNTER — Other Ambulatory Visit: Payer: Self-pay

## 2020-09-11 DIAGNOSIS — N6310 Unspecified lump in the right breast, unspecified quadrant: Secondary | ICD-10-CM

## 2020-09-11 DIAGNOSIS — R928 Other abnormal and inconclusive findings on diagnostic imaging of breast: Secondary | ICD-10-CM

## 2020-09-13 ENCOUNTER — Other Ambulatory Visit: Payer: Self-pay | Admitting: Family Medicine

## 2020-09-13 DIAGNOSIS — R928 Other abnormal and inconclusive findings on diagnostic imaging of breast: Secondary | ICD-10-CM

## 2020-09-24 NOTE — Progress Notes (Signed)
Name: Connie Sims   MRN: [id number]    DOB: January 09, 1981   Date:09/29/2020       Progress Note  Chief Complaint  Patient presents with  . Insomnia  . Restless leg syndrome  . Fibroids  . Allergies    Are year round and she is always congested. She was asked to go back to the car because of her congestion.     Subjective:   Connie Sims is a 39 y.o. female, presents to clinic for f/up on multiple chronic and acute conditions   Obesity/metabolic syndrome/prediabetes: She has lost some weight On ozempic 0.5 mg weekly dose and tolerating Wt Readings from Last 5 Encounters:  09/25/20 252 lb 4.8 oz (114.4 kg)  06/25/20 262 lb 9.6 oz (119.1 kg)  05/15/20 266 lb 8 oz (120.9 kg)  03/02/20 272 lb 1.6 oz (123.4 kg)  01/08/19 260 lb 11.2 oz (118.3 kg)   BMI Readings from Last 5 Encounters:  09/25/20 40.72 kg/m  06/25/20 42.38 kg/m  05/15/20 43.01 kg/m  03/02/20 43.92 kg/m  01/08/19 43.38 kg/m   Some weight loss started ozempic 6 months ago Prediabetes Lab Results  Component Value Date   HGBA1C 5.8 (H) 09/25/2020   Irregular menses and heavy bleeding, much better since on OCP with gyn - [facility]  Iron deficiency: Not supplementing po iron  RLS -  On restoril per Dr. Ancil Boozer, not helping much  Insomnia - chronic for years and years, no help with trazodone, reviewed chart - multiple meds tried in the past including some controlled substances     Current Outpatient Medications:  .  norgestimate-ethinyl estradiol (Zena 28) 0.25-35 MG-MCG tablet, Take 1 tablet by mouth daily., Disp: 84 tablet, Rfl: 3 .  OZEMPIC, 0.25 OR 0.5 MG/DOSE, 2 MG/1.5ML SOPN, INJECT 0.5 MG INTO THE SKIN ONCE A WEEK., Disp: 1.5 mL, Rfl: 2 .  pantoprazole (PROTONIX) 40 MG tablet, Take 1 tablet (40 mg total) by mouth 2 (two) times daily., Disp: 60 tablet, Rfl: 1 .  traZODone (DESYREL) 50 MG tablet, Take 1 tablet (50 mg total) by mouth at bedtime as needed for sleep., Disp: 90  tablet, Rfl: 1 .  Vitamin D, Ergocalciferol, (DRISDOL) 1.25 MG (50000 UNIT) CAPS capsule, Take 1 capsule (50,000 Units total) by mouth every 7 (seven) days., Disp: 12 capsule, Rfl: 3 .  pramipexole (MIRAPEX) 0.125 MG tablet, Take 1 tablet (0.125 mg total) by mouth at bedtime as needed (take two to three hours prior to bedtime)., Disp: 60 tablet, Rfl: 1  Patient Active Problem List   Diagnosis Date Noted  . Right ovarian cyst 06/27/2018  . Uterine leiomyoma 06/14/2018  . Wheat intolerance 02/15/2018  . Dysfunctional uterine bleeding 06/29/2017  . Highly echogenic liver on ultrasound 06/15/2017  . PLMD (periodic limb movement disorder) 02/01/2016  . Microcytosis 02/01/2016  . Vitamin D deficiency 02/01/2016  . Elevated alanine aminotransferase (ALT) level 02/01/2016  . Hypertriglyceridemia 10/23/2015  . 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  . PCO (polycystic ovaries)   . Allergic rhinitis   . Morbid obesity ([place])   . Insomnia   . Low back pain   . Impaired fasting glucose     Past Surgical History:  Procedure Laterality Date  . NASAL SINUS SURGERY      Family History  Problem Relation Age of Onset  . Liver disease Mother        fatty liver, s/p liver biopsy  . Alcohol abuse Mother   . Hypertension Maternal Grandmother   .  Glaucoma Maternal Grandmother   . Diabetes Maternal Grandfather   . Heart disease Maternal Grandfather   . Breast cancer Neg Hx   . Ovarian cancer Neg Hx   . Colon cancer Neg Hx     Social History   Tobacco Use  . Smoking status: Never Smoker  . Smokeless tobacco: Never Used  Vaping Use  . Vaping Use: Never used  Substance Use Topics  . Alcohol use: No    Alcohol/week: 0.0 standard drinks  . Drug use: No     No Known Allergies  Health Maintenance  Topic Date Due  . PAP SMEAR-Modifier  07/04/2020  . INFLUENZA VACCINE  07/26/2020  . TETANUS/TDAP  11/28/2021  . COVID-19 Vaccine  Completed  . Hepatitis C Screening  Completed  . HIV Screening   Completed    Chart Review Today: I personally reviewed active problem list, medication list, allergies, family history, social history, health maintenance, notes from last encounter, lab results, imaging with the patient/caregiver today.   Review of Systems  10 Systems reviewed and are negative for acute change except as noted in the HPI.  Objective:   Vitals:   09/25/20 1119  BP: 120/70  Pulse: 90  Resp: 16  Temp: 97.9 F (36.6 C)  TempSrc: Oral  SpO2: 99%  Weight: 252 lb 4.8 oz (114.4 kg)  Height: 5\' 6"  (1.676 m)    Body mass index is 40.72 kg/m.  Physical Exam Vitals and nursing note reviewed.  Constitutional:      General: She is not in acute distress.    Appearance: Normal appearance. She is obese. She is not ill-appearing, toxic-appearing or diaphoretic.  HENT:     Head: Normocephalic and atraumatic.     Right Ear: External ear normal.     Left Ear: External ear normal.  Eyes:     General: No scleral icterus.       Right eye: No discharge.        Left eye: No discharge.  Cardiovascular:     Rate and Rhythm: Normal rate and regular rhythm.     Pulses: Normal pulses.     Heart sounds: Normal heart sounds.  Pulmonary:     Effort: Pulmonary effort is normal.     Breath sounds: Normal breath sounds.  Abdominal:     General: Bowel sounds are normal.     Palpations: Abdomen is soft.  Musculoskeletal:     Right lower leg: No edema.     Left lower leg: No edema.  Skin:    General: Skin is warm and dry.     Capillary Refill: Capillary refill takes less than 2 seconds.     Coloration: Skin is not jaundiced or pale.  Neurological:     Mental Status: She is alert. Mental status is at baseline.  Psychiatric:        Attention and Perception: Attention normal.        Mood and Affect: Affect is flat.        Speech: Speech normal.        Behavior: Behavior is cooperative.         Assessment & Plan:     ICD-10-CM   1. RLS (restless legs syndrome)  G25.81  COMPLETE METABOLIC PANEL WITH GFR    CBC with Differential/Platelet    Iron, TIBC and Ferritin Panel    pramipexole (MIRAPEX) 0.125 MG tablet   continue to work on iron deficiency, trial of change in meds  2. Dysfunctional  uterine bleeding  N93.8    per gyn - on birth control, improving  3. Insomnia, unspecified type  G47.00    chronic, onset more than 5 years ago, trazodone doesn't help much, continue sleep hygeine, hope to improve RLS  4. Vitamin D deficiency  B16.9 COMPLETE METABOLIC PANEL WITH GFR   continue Vit D supplement  5. 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 Iron deficiency anemia, unspecified iron deficiency anemia type  D50.9 CBC with Differential/Platelet    Iron, TIBC and Ferritin Panel   recheck iron and CBC today, decreased blood loss through mentruation, work on increasing PO iron supplement  7. Paresthesia  I50.3 COMPLETE METABOLIC PANEL WITH GFR   B12 supplement OTC, replace iron, Vit D deficiency supplementation  8. Metabolic syndrome  U88.28 COMPLETE METABOLIC PANEL WITH GFR    Hemoglobin A1c   on ozempic, may need to increase dose, continue to work on diet/exercise/lifestyle  9. Morbid obesity ([facility])  E66.01    BMI 40 - pt would likely benefit from medical weight management   10. Impaired fasting glucose  M03.49 COMPLETE METABOLIC PANEL WITH GFR    Hemoglobin A1c   recheck A1C, on ozempic     Return in about 3 months (around 12/26/2020) for weight/prediabetes/insomnia/anemia/RLS.   Delsa Grana, PA-C 09/29/20 9:20 AM

## 2020-09-25 ENCOUNTER — Ambulatory Visit (INDEPENDENT_AMBULATORY_CARE_PROVIDER_SITE_OTHER): Payer: 59 | Admitting: Family Medicine

## 2020-09-25 ENCOUNTER — Other Ambulatory Visit: Payer: Self-pay

## 2020-09-25 ENCOUNTER — Encounter: Payer: Self-pay | Admitting: Family Medicine

## 2020-09-25 VITALS — BP 120/70 | HR 90 | Temp 97.9°F | Resp 16 | Ht 66.0 in | Wt 252.3 lb

## 2020-09-25 DIAGNOSIS — D509 Iron deficiency anemia, unspecified: Secondary | ICD-10-CM

## 2020-09-25 DIAGNOSIS — R202 Paresthesia of skin: Secondary | ICD-10-CM

## 2020-09-25 DIAGNOSIS — E559 Vitamin D deficiency, unspecified: Secondary | ICD-10-CM

## 2020-09-25 DIAGNOSIS — G4726 Circadian rhythm sleep disorder, shift work type: Secondary | ICD-10-CM

## 2020-09-25 DIAGNOSIS — R7301 Impaired fasting glucose: Secondary | ICD-10-CM

## 2020-09-25 DIAGNOSIS — G4700 Insomnia, unspecified: Secondary | ICD-10-CM | POA: Diagnosis not present

## 2020-09-25 DIAGNOSIS — E8881 Metabolic syndrome: Secondary | ICD-10-CM

## 2020-09-25 DIAGNOSIS — G2581 Restless legs syndrome: Secondary | ICD-10-CM

## 2020-09-25 DIAGNOSIS — N938 Other specified abnormal uterine and vaginal bleeding: Secondary | ICD-10-CM | POA: Diagnosis not present

## 2020-09-25 NOTE — Patient Instructions (Addendum)
Iron supplement - try 3x a week  B12 supplement or a B complex vitamin once a day  Vit D 3 - you'll probably need 2000 IU daily   Or   The high doses weekly   WE can try a different medicine for your leg symptoms and I can look at other medicines for insomnia - there are some newer ones I may be able to get you approved for or get a sample from the drug reps to see if they work for you.

## 2020-09-26 LAB — CBC WITH DIFFERENTIAL/PLATELET
Absolute Monocytes: 502 cells/uL (ref 200–950)
Basophils Absolute: 43 cells/uL (ref 0–200)
Basophils Relative: 0.5 %
Eosinophils Absolute: 196 cells/uL (ref 15–500)
Eosinophils Relative: 2.3 %
HCT: 39.6 % (ref 35.0–45.0)
Hemoglobin: 12.7 g/dL (ref 11.7–15.5)
Lymphs Abs: 2040 cells/uL (ref 850–3900)
MCH: 26.2 pg — ABNORMAL LOW (ref 27.0–33.0)
MCHC: 32.1 g/dL (ref 32.0–36.0)
MCV: 81.6 fL (ref 80.0–100.0)
MPV: 9.5 fL (ref 7.5–12.5)
Monocytes Relative: 5.9 %
Neutro Abs: 5721 cells/uL (ref 1500–7800)
Neutrophils Relative %: 67.3 %
Platelets: 293 10*3/uL (ref 140–400)
RBC: 4.85 10*6/uL (ref 3.80–5.10)
RDW: 15.8 % — ABNORMAL HIGH (ref 11.0–15.0)
Total Lymphocyte: 24 %
WBC: 8.5 10*3/uL (ref 3.8–10.8)

## 2020-09-26 LAB — COMPLETE METABOLIC PANEL WITH GFR
AG Ratio: 1.5 (calc) (ref 1.0–2.5)
ALT: 32 U/L — ABNORMAL HIGH (ref 6–29)
AST: 18 U/L (ref 10–30)
Albumin: 4.1 g/dL (ref 3.6–5.1)
Alkaline phosphatase (APISO): 104 U/L (ref 31–125)
BUN: 10 mg/dL (ref 7–25)
CO2: 28 mmol/L (ref 20–32)
Calcium: 9 mg/dL (ref 8.6–10.2)
Chloride: 103 mmol/L (ref 98–110)
Creat: 0.8 mg/dL (ref 0.50–1.10)
GFR, Est African American: 107 mL/min/{1.73_m2} (ref >=60)
GFR, Est Non African American: 92 mL/min/{1.73_m2} (ref >=60)
Globulin: 2.7 g/dL (calc) (ref 1.9–3.7)
Glucose, Bld: 89 mg/dL (ref 65–99)
Potassium: 4.4 mmol/L (ref 3.5–5.3)
Sodium: 138 mmol/L (ref 135–146)
Total Bilirubin: 0.4 mg/dL (ref 0.2–1.2)
Total Protein: 6.8 g/dL (ref 6.1–8.1)

## 2020-09-26 LAB — HEMOGLOBIN A1C
Hgb A1c MFr Bld: 5.8 % of total Hgb — ABNORMAL HIGH (ref <5.7)
Mean Plasma Glucose: 120 (calc)
eAG (mmol/L): 6.6 (calc)

## 2020-09-26 LAB — IRON,TIBC AND FERRITIN PANEL
%SAT: 12 % (calc) — ABNORMAL LOW (ref 16–45)
Ferritin: 30 ng/mL (ref 16–154)
Iron: 55 ug/dL (ref 40–190)
TIBC: 470 mcg/dL (calc) — ABNORMAL HIGH (ref 250–450)

## 2020-09-29 MED ORDER — PRAMIPEXOLE DIHYDROCHLORIDE 0.125 MG PO TABS: 0.1250 mg | ORAL_TABLET | Freq: Every evening | ORAL | 1 refills | Status: DC | PRN

## 2020-09-29 MED ORDER — TRAZODONE HCL 50 MG PO TABS: 50.0000 mg | ORAL_TABLET | Freq: Every evening | ORAL | 1 refills | Status: DC | PRN

## 2020-10-05 LAB — HM DIABETES EYE EXAM

## 2020-10-23 ENCOUNTER — Other Ambulatory Visit: Payer: Self-pay | Admitting: Family Medicine

## 2020-10-23 DIAGNOSIS — G2581 Restless legs syndrome: Secondary | ICD-10-CM

## 2020-10-29 ENCOUNTER — Encounter: Payer: Self-pay | Admitting: Family Medicine

## 2020-11-29 ENCOUNTER — Other Ambulatory Visit: Payer: Self-pay | Admitting: Family Medicine

## 2020-11-29 DIAGNOSIS — G2581 Restless legs syndrome: Secondary | ICD-10-CM

## 2020-12-04 ENCOUNTER — Other Ambulatory Visit: Payer: Self-pay | Admitting: Family Medicine

## 2020-12-04 DIAGNOSIS — E8881 Metabolic syndrome: Secondary | ICD-10-CM

## 2020-12-04 DIAGNOSIS — R7301 Impaired fasting glucose: Secondary | ICD-10-CM

## 2020-12-12 ENCOUNTER — Ambulatory Visit: Payer: 59 | Attending: Internal Medicine

## 2020-12-12 DIAGNOSIS — Z23 Encounter for immunization: Secondary | ICD-10-CM

## 2020-12-12 NOTE — Progress Notes (Signed)
   Covid-19 Vaccination Clinic  Name:  Connie Sims    MRN: [id number] DOB: 09-07-1981  12/12/2020  Connie Sims was observed post Covid-19 immunization for 15 minutes without incident. She was provided with Vaccine Information Sheet and instruction to access the V-Safe system.   Connie Sims was instructed to call 911 with any severe reactions post vaccine: Marland Kitchen Difficulty breathing  . Swelling of face and throat  . A fast heartbeat  . A bad rash all over body  . Dizziness and weakness   Immunizations Administered    Name Date Dose VIS Date Route   Pfizer COVID-19 Vaccine 12/12/2020 12:52 PM 0.3 mL 10/14/2020 Intramuscular   Manufacturer: [place]   Lot: [id number]   [place]: [id number]

## 2020-12-27 ENCOUNTER — Other Ambulatory Visit: Payer: Self-pay | Admitting: Family Medicine

## 2020-12-27 DIAGNOSIS — G2581 Restless legs syndrome: Secondary | ICD-10-CM

## 2021-03-17 ENCOUNTER — Other Ambulatory Visit: Payer: Self-pay

## 2021-03-17 ENCOUNTER — Ambulatory Visit
Admission: RE | Admit: 2021-03-17 | Discharge: 2021-03-17 | Disposition: A | Discharge status: Home / Self Care | Payer: 59 | Source: Ambulatory Visit | Attending: Family Medicine | Admitting: Family Medicine

## 2021-03-17 ENCOUNTER — Other Ambulatory Visit: Payer: Self-pay | Admitting: Family Medicine

## 2021-03-17 DIAGNOSIS — N6310 Unspecified lump in the right breast, unspecified quadrant: Secondary | ICD-10-CM

## 2021-04-10 ENCOUNTER — Other Ambulatory Visit: Payer: Self-pay | Admitting: Family Medicine

## 2021-04-10 DIAGNOSIS — E8881 Metabolic syndrome: Secondary | ICD-10-CM

## 2021-04-10 DIAGNOSIS — R7301 Impaired fasting glucose: Secondary | ICD-10-CM

## 2021-04-10 NOTE — Telephone Encounter (Signed)
MyChart message sent to pt to make appt for diabetes f/u and labs. Last RF 12/04/20 RF is due, no FVS. Overdue lab work (Hgb A1C)

## 2021-04-12 NOTE — Telephone Encounter (Signed)
lvm for pt to call and schedule an appt for refills

## 2021-04-13 NOTE — Telephone Encounter (Signed)
More refills after appt, I did not prescribe this med, have not done labs or OV for it and do not know if she will need dose adjustments - enough of same dose sent in until her appt

## 2021-04-16 ENCOUNTER — Other Ambulatory Visit: Payer: Self-pay

## 2021-04-16 ENCOUNTER — Ambulatory Visit (INDEPENDENT_AMBULATORY_CARE_PROVIDER_SITE_OTHER): Payer: 59 | Admitting: Family Medicine

## 2021-04-16 ENCOUNTER — Encounter: Payer: Self-pay | Admitting: Family Medicine

## 2021-04-16 VITALS — BP 122/82 | HR 93 | Temp 98.0°F | Resp 16 | Ht 65.0 in | Wt 262.0 lb

## 2021-04-16 DIAGNOSIS — R7301 Impaired fasting glucose: Secondary | ICD-10-CM | POA: Diagnosis not present

## 2021-04-16 DIAGNOSIS — M545 Low back pain, unspecified: Secondary | ICD-10-CM

## 2021-04-16 DIAGNOSIS — Z6841 Body Mass Index (BMI) 40.0 and over, adult: Secondary | ICD-10-CM

## 2021-04-16 DIAGNOSIS — G8929 Other chronic pain: Secondary | ICD-10-CM

## 2021-04-16 DIAGNOSIS — E8881 Metabolic syndrome: Secondary | ICD-10-CM

## 2021-04-16 DIAGNOSIS — K219 Gastro-esophageal reflux disease without esophagitis: Secondary | ICD-10-CM

## 2021-04-16 MED ORDER — CYCLOBENZAPRINE HCL 10 MG PO TABS: 10.0000 mg | ORAL_TABLET | Freq: Every day | ORAL | 1 refills | Status: DC

## 2021-04-16 MED ORDER — OZEMPIC (0.25 OR 0.5 MG/DOSE) 2 MG/1.5ML ~~LOC~~ SOPN: 0.5000 mg | PEN_INJECTOR | SUBCUTANEOUS | 8 refills | Status: DC

## 2021-04-16 MED ORDER — PANTOPRAZOLE SODIUM 40 MG PO TBEC: 40.0000 mg | DELAYED_RELEASE_TABLET | Freq: Two times a day (BID) | ORAL | 1 refills | Status: DC

## 2021-04-16 NOTE — Patient Instructions (Signed)
Health Maintenance, Female Adopting a healthy lifestyle and getting preventive care are important in promoting health and wellness. Ask your health care provider about:  The right schedule for you to have regular tests and exams.  Things you can do on your own to prevent diseases and keep yourself healthy. What should I know about diet, weight, and exercise? Eat a healthy diet  Eat a diet that includes plenty of vegetables, fruits, low-fat dairy products, and lean protein.  Do not eat a lot of foods that are high in solid fats, added sugars, or sodium.   Maintain a healthy weight Body mass index (BMI) is used to identify weight problems. It estimates body fat based on height and weight. Your health care provider can help determine your BMI and help you achieve or maintain a healthy weight. Get regular exercise Get regular exercise. This is one of the most important things you can do for your health. Most adults should:  Exercise for at least 150 minutes each week. The exercise should increase your heart rate and make you sweat (moderate-intensity exercise).  Do strengthening exercises at least twice a week. This is in addition to the moderate-intensity exercise.  Spend less time sitting. Even light physical activity can be beneficial. Watch cholesterol and blood lipids Have your blood tested for lipids and cholesterol at 41 years of age, then have this test every 5 years. Have your cholesterol levels checked more often if:  Your lipid or cholesterol levels are high.  You are older than 40 years of age.  You are at high risk for heart disease. What should I know about cancer screening? Depending on your health history and family history, you may need to have cancer screening at various ages. This may include screening for:  Breast cancer.  Cervical cancer.  Colorectal cancer.  Skin cancer.  Lung cancer. What should I know about heart disease, diabetes, and high blood  pressure? Blood pressure and heart disease  High blood pressure causes heart disease and increases the risk of stroke. This is more likely to develop in people who have high blood pressure readings, are of African descent, or are overweight.  Have your blood pressure checked: ? Every 3-5 years if you are 18-39 years of age. ? Every year if you are 40 years old or older. Diabetes Have regular diabetes screenings. This checks your fasting blood sugar level. Have the screening done:  Once every three years after age 40 if you are at a normal weight and have a low risk for diabetes.  More often and at a younger age if you are overweight or have a high risk for diabetes. What should I know about preventing infection? Hepatitis B If you have a higher risk for hepatitis B, you should be screened for this virus. Talk with your health care provider to find out if you are at risk for hepatitis B infection. Hepatitis C Testing is recommended for:  Everyone born from 1945 through 1965.  Anyone with known risk factors for hepatitis C. Sexually transmitted infections (STIs)  Get screened for STIs, including gonorrhea and chlamydia, if: ? You are sexually active and are younger than 41 years of age. ? You are older than 41 years of age and your health care provider tells you that you are at risk for this type of infection. ? Your sexual activity has changed since you were last screened, and you are at increased risk for chlamydia or gonorrhea. Ask your health care provider   if you are at risk.  Ask your health care provider about whether you are at high risk for HIV. Your health care provider may recommend a prescription medicine to help prevent HIV infection. If you choose to take medicine to prevent HIV, you should first get tested for HIV. You should then be tested every 3 months for as long as you are taking the medicine. Pregnancy  If you are about to stop having your period (premenopausal) and  you may become pregnant, seek counseling before you get pregnant.  Take 400 to 800 micrograms (mcg) of folic acid every day if you become pregnant.  Ask for birth control (contraception) if you want to prevent pregnancy. Osteoporosis and menopause Osteoporosis is a disease in which the bones lose minerals and strength with aging. This can result in bone fractures. If you are 65 years old or older, or if you are at risk for osteoporosis and fractures, ask your health care provider if you should:  Be screened for bone loss.  Take a calcium or vitamin D supplement to lower your risk of fractures.  Be given hormone replacement therapy (HRT) to treat symptoms of menopause. Follow these instructions at home: Lifestyle  Do not use any products that contain nicotine or tobacco, such as cigarettes, e-cigarettes, and chewing tobacco. If you need help quitting, ask your health care provider.  Do not use street drugs.  Do not share needles.  Ask your health care provider for help if you need support or information about quitting drugs. Alcohol use  Do not drink alcohol if: ? Your health care provider tells you not to drink. ? You are pregnant, may be pregnant, or are planning to become pregnant.  If you drink alcohol: ? Limit how much you use to 0-1 drink a day. ? Limit intake if you are breastfeeding.  Be aware of how much alcohol is in your drink. In the [place] one drink equals one 12 oz bottle of beer (355 mL), one 5 oz glass of wine (148 mL), or one 1 oz glass of hard liquor (44 mL). General instructions  Schedule regular health, dental, and eye exams.  Stay current with your vaccines.  Tell your health care provider if: ? You often feel depressed. ? You have ever been abused or do not feel safe at home. Summary  Adopting a healthy lifestyle and getting preventive care are important in promoting health and wellness.  Follow your health care provider's instructions about healthy  diet, exercising, and getting tested or screened for diseases.  Follow your health care provider's instructions on monitoring your cholesterol and blood pressure. This information is not intended to replace advice given to you by your health care provider. Make sure you discuss any questions you have with your health care provider. Document Revised: 12/05/2018 Document Reviewed: 12/05/2018 Elsevier Patient Education  2021 [employer]vier Inc.  

## 2021-04-16 NOTE — Progress Notes (Signed)
4/22/202211:36 AM  Connie Sims [birth year], 40 y.o., female [id number]  Chief Complaint  Patient presents with  . Medication Refill    HPI:   Patient is a 40 y.o. female with past medical history significant for pre-diabetes, GERD who presents today for medication refills.  Denies acute issues at this time  Metabolic Syndrome Ozempic Has been out of this for 4 weeks Prior to had good medication compliance Wt Readings from Last 3 Encounters:  04/16/21 262 lb (118.8 kg)  09/25/20 252 lb 4.8 oz (114.4 kg)  06/25/20 262 lb 9.6 oz (119.1 kg)   Lab Results  Component Value Date   HGBA1C 5.8 (H) 09/25/2020   Lab Results  Component Value Date   NA 138 09/25/2020   K 4.4 09/25/2020   CO2 28 09/25/2020   GLUCOSE 89 09/25/2020   BUN 10 09/25/2020   CREATININE 0.80 09/25/2020   CALCIUM 9.0 09/25/2020   GFRNONAA 92 09/25/2020   GFRAA 107 09/25/2020    Has intermittent back pain Will be seeing the chiropractor this week Is requesting a refill for flexeril  Would also like refill of Protonix GERD is well controlled on that medication  Depression screen [facility] 2/9 04/16/2021 09/25/2020 06/25/2020  Decreased Interest 0 0 0  Down, Depressed, Hopeless 0 0 0  PHQ - 2 Score 0 0 0  Altered sleeping - - 3  Tired, decreased energy - - 1  Change in appetite - - 0  Feeling bad or failure about yourself  - - 0  Trouble concentrating - - 0  Moving slowly or fidgety/restless - - 0  Suicidal thoughts - - 0  PHQ-9 Score - - 4  Difficult doing work/chores - - Not difficult at all    Fall Risk  04/16/2021 09/25/2020 06/25/2020 05/15/2020 03/02/2020  Falls in the past year? 0 0 0 0 0  Number falls in past yr: 0 0 0 0 0  Injury with Fall? 0 0 0 0 0  Follow up - - - Falls evaluation completed Falls evaluation completed     No Known Allergies  Prior to Admission medications   Medication Sig Start Date End Date Taking? Authorizing Provider  norgestimate-ethinyl estradiol (SPRINTEC 28)  0.25-35 MG-MCG tablet Take 1 tablet by mouth daily. 06/26/20   Delsa Grana, PA-C  OZEMPIC, 0.25 OR 0.5 MG/DOSE, 2 MG/1.5ML SOPN INJECT 0.5 MG INTO THE SKIN ONCE A WEEK. 04/13/21   Delsa Grana, PA-C  pantoprazole (PROTONIX) 40 MG tablet Take 1 tablet (40 mg total) by mouth 2 (two) times daily. 06/25/20   Delsa Grana, PA-C  pramipexole (MIRAPEX) 0.125 MG tablet TAKE 1 TABLET BY MOUTH AT BEDTIME AS NEEDED. TAKE 2 TO 3 HOURS PRIOR TO BEDTIME 12/28/20   Delsa Grana, PA-C  traZODone (DESYREL) 50 MG tablet Take 1 tablet (50 mg total) by mouth at bedtime as needed for sleep. 09/29/20   Delsa Grana, PA-C  Vitamin D, Ergocalciferol, (DRISDOL) 1.25 MG (50000 UNIT) CAPS capsule Take 1 capsule (50,000 Units total) by mouth every 7 (seven) days. 06/26/20   Delsa Grana, PA-C    Past Medical History:  Diagnosis Date  . Allergic rhinitis   . Allergic rhinitis with postnasal drip   . Congenital osteopetrosis    previously managed by specialist at [facility], periodic hearing tests  . Diabetes mellitus without complication ([place])   . Epigastric pain   . Foot fracture, right    Jones fx 5th metatarsal  . Impaired fasting glucose   .  Insomnia   . Low back pain   . LUQ pain   . Microcytosis   . Morbid obesity with BMI of 40.0-44.9, adult ([place])   . PCOS (polycystic ovarian syndrome)   . Trochanteric bursitis of right hip     Past Surgical History:  Procedure Laterality Date  . NASAL SINUS SURGERY      Social History   Tobacco Use  . Smoking status: Never Smoker  . Smokeless tobacco: Never Used  Substance Use Topics  . Alcohol use: No    Alcohol/week: 0.0 standard drinks    Family History  Problem Relation Age of Onset  . Liver disease Mother        fatty liver, s/p liver biopsy  . Alcohol abuse Mother   . Hypertension Maternal Grandmother   . Glaucoma Maternal Grandmother   . Diabetes Maternal Grandfather   . Heart disease Maternal Grandfather   . Breast cancer Neg Hx   . Ovarian cancer Neg Hx    . Colon cancer Neg Hx     Review of Systems  Respiratory: Negative.   Cardiovascular: Negative.   Gastrointestinal: Positive for heartburn.  Musculoskeletal: Positive for back pain.       Restless legs  Neurological: Negative.   Psychiatric/Behavioral: The patient has insomnia.      OBJECTIVE:  Today's Vitals   04/16/21 1124  BP: 122/82  Pulse: 93  Resp: 16  Temp: 98 F (36.7 C)  TempSrc: Oral  SpO2: 99%  Weight: 262 lb (118.8 kg)  Height: 5\' 5"  (1.651 m)   Body mass index is 43.6 kg/m.   Physical Exam Constitutional:      General: She is not in acute distress.    Appearance: Normal appearance. She is obese. She is not ill-appearing.  HENT:     Head: Normocephalic.  Cardiovascular:     Rate and Rhythm: Normal rate and regular rhythm.     Pulses: Normal pulses.     Heart sounds: Normal heart sounds. No murmur heard. No friction rub. No gallop.   Pulmonary:     Effort: Pulmonary effort is normal. No respiratory distress.     Breath sounds: Normal breath sounds. No stridor. No wheezing, rhonchi or rales.  Abdominal:     General: Bowel sounds are normal.     Palpations: Abdomen is soft.     Tenderness: There is no abdominal tenderness.  Musculoskeletal:     Cervical back: Normal.     Thoracic back: Normal.     Lumbar back: Normal.     Right lower leg: No edema.     Left lower leg: No edema.  Skin:    General: Skin is warm and dry.  Neurological:     Mental Status: She is alert and oriented to person, place, and time.  Psychiatric:        Mood and Affect: Mood normal.        Behavior: Behavior normal.     No results found for this or any previous visit (from the past 24 hour(s)).  No results found.   ASSESSMENT and PLAN  Problem List Items Addressed This Visit      Endocrine   Impaired fasting glucose   Relevant Medications   Semaglutide,0.25 or 0.5MG /DOS, (OZEMPIC, 0.25 OR 0.5 MG/DOSE,) 2 MG/1.5ML SOPN   Other Relevant Orders   Hemoglobin  A1c     Other   Morbid obesity ([place])   Relevant Medications   Semaglutide,0.25 or 0.5MG /DOS, (OZEMPIC, 0.25 OR 0.5  MG/DOSE,) 2 MG/1.5ML SOPN   Low back pain   Relevant Medications   cyclobenzaprine (FLEXERIL) 10 MG tablet    Other Visit Diagnoses    BMI 40.0-44.9, adult ([place])    -  Primary   Relevant Medications   Semaglutide,0.25 or 0.5MG /DOS, (OZEMPIC, 0.25 OR 0.5 MG/DOSE,) 2 VV/7.4MO SOPN   Metabolic syndrome       Relevant Medications   Semaglutide,0.25 or 0.5MG /DOS, (OZEMPIC, 0.25 OR 0.5 MG/DOSE,) 2 MG/1.5ML SOPN   Other Relevant Orders   Comprehensive metabolic panel   Gastroesophageal reflux disease without esophagitis       Refill on Protonix 40 mg sent to the pharmacy for twice daily dosing emphasized the dose change patient can follow-up with GI   Relevant Medications   pantoprazole (PROTONIX) 40 MG tablet     Plan . Medication refills sent, r/se/b of medications discussed . Continue to work on weight loss   Return in about 6 months (around 10/16/2021).    Huston Foley Lavon Bothwell, FNP-BC [facility]

## 2021-04-17 LAB — COMPREHENSIVE METABOLIC PANEL
AG Ratio: 1.6 (calc) (ref 1.0–2.5)
ALT: 25 U/L (ref 6–29)
AST: 16 U/L (ref 10–30)
Albumin: 3.9 g/dL (ref 3.6–5.1)
Alkaline phosphatase (APISO): 96 U/L (ref 31–125)
BUN: 11 mg/dL (ref 7–25)
CO2: 27 mmol/L (ref 20–32)
Calcium: 9 mg/dL (ref 8.6–10.2)
Chloride: 103 mmol/L (ref 98–110)
Creat: 0.75 mg/dL (ref 0.50–1.10)
Globulin: 2.5 g/dL (calc) (ref 1.9–3.7)
Glucose, Bld: 91 mg/dL (ref 65–99)
Potassium: 4.2 mmol/L (ref 3.5–5.3)
Sodium: 139 mmol/L (ref 135–146)
Total Bilirubin: 0.3 mg/dL (ref 0.2–1.2)
Total Protein: 6.4 g/dL (ref 6.1–8.1)

## 2021-04-17 LAB — HEMOGLOBIN A1C
Hgb A1c MFr Bld: 5.7 % of total Hgb — ABNORMAL HIGH (ref <5.7)
Mean Plasma Glucose: 117 mg/dL
eAG (mmol/L): 6.5 mmol/L

## 2021-05-24 ENCOUNTER — Other Ambulatory Visit: Payer: Self-pay | Admitting: Family Medicine

## 2021-05-24 DIAGNOSIS — Z3041 Encounter for surveillance of contraceptive pills: Secondary | ICD-10-CM

## 2021-05-25 NOTE — Telephone Encounter (Signed)
cpe appt sch'd and pt aware of prescription being sent to pharmacy

## 2021-07-02 ENCOUNTER — Encounter: Payer: Self-pay | Admitting: Family Medicine

## 2021-07-02 ENCOUNTER — Other Ambulatory Visit: Payer: Self-pay

## 2021-07-02 ENCOUNTER — Ambulatory Visit (INDEPENDENT_AMBULATORY_CARE_PROVIDER_SITE_OTHER): Payer: 59 | Admitting: Family Medicine

## 2021-07-02 VITALS — BP 124/84 | HR 85 | Temp 97.9°F | Resp 16 | Ht 65.0 in | Wt 262.1 lb

## 2021-07-02 DIAGNOSIS — G8929 Other chronic pain: Secondary | ICD-10-CM

## 2021-07-02 DIAGNOSIS — Z6841 Body Mass Index (BMI) 40.0 and over, adult: Secondary | ICD-10-CM

## 2021-07-02 DIAGNOSIS — E8881 Metabolic syndrome: Secondary | ICD-10-CM

## 2021-07-02 DIAGNOSIS — K219 Gastro-esophageal reflux disease without esophagitis: Secondary | ICD-10-CM

## 2021-07-02 DIAGNOSIS — Z3041 Encounter for surveillance of contraceptive pills: Secondary | ICD-10-CM

## 2021-07-02 DIAGNOSIS — G2581 Restless legs syndrome: Secondary | ICD-10-CM

## 2021-07-02 DIAGNOSIS — Z0000 Encounter for general adult medical examination without abnormal findings: Secondary | ICD-10-CM | POA: Diagnosis not present

## 2021-07-02 DIAGNOSIS — M545 Low back pain, unspecified: Secondary | ICD-10-CM

## 2021-07-02 DIAGNOSIS — E785 Hyperlipidemia, unspecified: Secondary | ICD-10-CM

## 2021-07-02 DIAGNOSIS — R7301 Impaired fasting glucose: Secondary | ICD-10-CM

## 2021-07-02 MED ORDER — NORGESTIMATE-ETH ESTRADIOL 0.25-35 MG-MCG PO TABS: 1.0000 | ORAL_TABLET | Freq: Every day | ORAL | 3 refills | Status: DC

## 2021-07-02 MED ORDER — CYCLOBENZAPRINE HCL 10 MG PO TABS
5.0000 mg | ORAL_TABLET | Freq: Every day | ORAL | 1 refills | Status: DC
Start: 2021-07-02 — End: 2022-02-09

## 2021-07-02 MED ORDER — PRAMIPEXOLE DIHYDROCHLORIDE 0.25 MG PO TABS: ORAL_TABLET | ORAL | 5 refills | Status: DC

## 2021-07-02 MED ORDER — PANTOPRAZOLE SODIUM 40 MG PO TBEC: 40.0000 mg | DELAYED_RELEASE_TABLET | Freq: Every day | ORAL | 3 refills | Status: DC

## 2021-07-02 NOTE — Progress Notes (Signed)
Patient: Connie Sims, Female    DOB: 26-Mar-1981, 40 y.o.   MRN: [id number] Delsa Grana, PA-C Visit Date: 07/02/2021  Today's Provider: Delsa Grana, PA-C   Chief Complaint  Patient presents with   Annual Exam    With pap   Subjective:   Annual physical exam:  Connie Sims is a 40 y.o. female who presents today for complete physical exam:  Exercise/Activity:  not very active Diet/nutrition:  still drinking a lot of soda, otherwise pretty healthy diet Sleep:  poor sleep with RLS and chronic back pain  Last cycle was heavier than normal - poor consistency with taking OCP  Mammo and breast lump in the past evaluated by Dr. Ancil Boozer - reviewed today has f/up mammo/us in Aug  Obesity/PCOS/metabolic syndrome and prediabetes Ozempic on low dose 0.5 mg - some stomach upset, but improving  Wt Readings from Last 5 Encounters:  07/02/21 262 lb 1.6 oz (118.9 kg)  04/16/21 262 lb (118.8 kg)  09/25/20 252 lb 4.8 oz (114.4 kg)  06/25/20 262 lb 9.6 oz (119.1 kg)  05/15/20 266 lb 8 oz (120.9 kg)   BMI Readings from Last 5 Encounters:  07/02/21 43.62 kg/m  04/16/21 43.60 kg/m  09/25/20 40.72 kg/m  06/25/20 42.38 kg/m  05/15/20 43.01 kg/m   Lab Results  Component Value Date   HGBA1C 5.7 (H) 04/16/2021   GERD - better down to one ppi daily  Pt wished to discuss acute complaints  do routine f/up on chronic conditions today in addition to CPE. Advised pt of separate visit billing/coding  USPSTF grade A and B recommendations - reviewed and addressed today  Depression:  Phq 9 completed today by patient, was reviewed by me with patient in the room PHQ score is neg, pt feels good PHQ 2/9 Scores 07/02/2021 04/16/2021 09/25/2020 06/25/2020  PHQ - 2 Score 0 0 0 0  PHQ- 9 Score 0 - - 4   Depression screen [facility] 2/9 07/02/2021 04/16/2021 09/25/2020 06/25/2020 05/15/2020  Decreased Interest 0 0 0 0 0  Down, Depressed, Hopeless 0 0 0 0 0  PHQ - 2 Score 0 0 0 0 0  Altered sleeping 0 - - 3  2  Tired, decreased energy 0 - - 1 0  Change in appetite 0 - - 0 0  Feeling bad or failure about yourself  0 - - 0 0  Trouble concentrating 0 - - 0 0  Moving slowly or fidgety/restless 0 - - 0 0  Suicidal thoughts 0 - - 0 0  PHQ-9 Score 0 - - 4 2  Difficult doing work/chores Not difficult at all - - Not difficult at all Not difficult at all    Alcohol screening: [place] Office Visit from 06/25/2020 in [facility]  AUDIT-C Score 0       Immunizations and Health Maintenance: Health Maintenance  Topic Date Due   PAP SMEAR-Modifier  07/04/2020   INFLUENZA VACCINE  07/26/2021   TETANUS/TDAP  11/28/2021   COVID-19 Vaccine  Completed   Hepatitis C Screening  Completed   HIV Screening  Completed   Pneumococcal Vaccine 75-15 Years old  Aged Out   HPV VACCINES  Aged Out     Hep C Screening:   STD testing and prevention (HIV/chl/gon/syphilis):  see above, no additional testing desired by pt today  Intimate partner violence:  denies abuse  Sexual History/Pain during Intercourse: Married  Menstrual History/LMP/Abnormal Bleeding: normal cycles  Patient's last menstrual period was  06/12/2021.  Incontinence Symptoms: none  Breast cancer:  lump f/up in August bilateral diag mammo and [place]  Last Mammogram: *see HM list above BRCA gene screening: none known  Cervical cancer screening: discussed ACS guidelines will do PAP/HPV in next 2 years, still in window with negative cotesting Pt denies family hx of cancers - breast, ovarian, uterine, colon:     Osteoporosis:   Discussion on osteoporosis per age, including high calcium and vitamin D supplementation, weight bearing exercises   Skin cancer:  Hx of skin CA -  NO  Discussed atypical lesions   Colorectal cancer:   Colonoscopy is not due per age  Discussed concerning signs and sx of CRC, pt denies melena hematochezia   Lung cancer:   Low Dose CT Chest recommended if Age 79-80 years, 30 pack-year  currently smoking OR have quit w/in 15years. Patient does not qualify.    Social History   Tobacco Use   Smoking status: Never   Smokeless tobacco: Never  Vaping Use   Vaping Use: Never used  Substance Use Topics   Alcohol use: No    Alcohol/week: 0.0 standard drinks   Drug use: No     Flowsheet Row Office Visit from 06/25/2020 in [facility]  AUDIT-C Score 0       Family History  Problem Relation Age of Onset   Liver disease Mother        fatty liver, s/p liver biopsy   Alcohol abuse Mother    Hypertension Maternal Grandmother    Glaucoma Maternal Grandmother    Diabetes Maternal Grandfather    Heart disease Maternal Grandfather    Breast cancer Neg Hx    Ovarian cancer Neg Hx    Colon cancer Neg Hx      Blood pressure/Hypertension: BP Readings from Last 3 Encounters:  07/02/21 124/84  04/16/21 122/82  09/25/20 120/70    Weight/Obesity: Wt Readings from Last 3 Encounters:  07/02/21 262 lb 1.6 oz (118.9 kg)  04/16/21 262 lb (118.8 kg)  09/25/20 252 lb 4.8 oz (114.4 kg)   BMI Readings from Last 3 Encounters:  07/02/21 43.62 kg/m  04/16/21 43.60 kg/m  09/25/20 40.72 kg/m     Lipids:  Lab Results  Component Value Date   CHOL 155 03/02/2020   CHOL 157 01/08/2019   CHOL 178 06/19/2018   Lab Results  Component Value Date   HDL 46 (L) 03/02/2020   HDL 43 (L) 01/08/2019   HDL 43 (L) 06/19/2018   Lab Results  Component Value Date   LDLCALC 83 03/02/2020   LDLCALC 82 01/08/2019   LDLCALC 106 (H) 06/19/2018   Lab Results  Component Value Date   TRIG 166 (H) 03/02/2020   TRIG 218 (H) 01/08/2019   TRIG 169 (H) 06/19/2018   Lab Results  Component Value Date   CHOLHDL 3.4 03/02/2020   CHOLHDL 3.7 01/08/2019   CHOLHDL 4.1 06/19/2018   No results found for: LDLDIRECT Based on the results of lipid panel his/her cardiovascular risk factor ( using [employer] )  in the next 10 years is: The 10-year ASCVD risk score Mikey Bussing DC Brooke Bonito.,  et al., 2013) is: 0.5%   Values used to calculate the score:     Age: 74 years     Sex: Female     Is Non-Hispanic African American: No     Diabetic: No     Tobacco smoker: No     Systolic Blood Pressure: 902 mmHg  Is BP treated: No     HDL Cholesterol: 46 mg/dL     Total Cholesterol: 155 mg/dL Glucose:  Glucose, Bld  Date Value Ref Range Status  04/16/2021 91 65 - 99 mg/dL Final    Comment:    .            Fasting reference interval .   09/25/2020 89 65 - 99 mg/dL Final    Comment:    .            Fasting reference interval .   06/25/2020 94 65 - 99 mg/dL Final    Comment:    .            Fasting reference interval .    Hypertension: BP Readings from Last 3 Encounters:  07/02/21 124/84  04/16/21 122/82  09/25/20 120/70   Obesity: Wt Readings from Last 3 Encounters:  07/02/21 262 lb 1.6 oz (118.9 kg)  04/16/21 262 lb (118.8 kg)  09/25/20 252 lb 4.8 oz (114.4 kg)   BMI Readings from Last 3 Encounters:  07/02/21 43.62 kg/m  04/16/21 43.60 kg/m  09/25/20 40.72 kg/m      Advanced Care Planning:  A voluntary discussion about advance care planning including the explanation and discussion of advance directives.   Discussed health care proxy and Living will, and the patient was able to identify a health care proxy as mom - Titilayo Hagans.   Patient does not have a living will at present time.   Social History      She        Social History   Socioeconomic History   Marital status: Married    Spouse name: Not on file   Number of children: 0   Years of education: Masters   Highest education level: Not on file  Occupational History   Occupation: Chief Financial Officer   Tobacco Use   Smoking status: Never   Smokeless tobacco: Never  Vaping Use   Vaping Use: Never used  Substance and Sexual Activity   Alcohol use: No    Alcohol/week: 0.0 standard drinks   Drug use: No   Sexual activity: Not Currently    Birth control/protection: Condom, Pill  Other Topics  Concern   Not on file  Social History Narrative   Drinks 2- 20oz pepsi a day   Works for [employer] as an Chief Financial Officer    She was at [employer] band   She plays the saxophone    Dating someone from [place]    Social Determinants of Health   Financial Resource Strain: Unknown   Difficulty of Paying Living Expenses: Patient refused  Food Insecurity: Unknown   Worried About Charity fundraiser in the Last Year: Patient refused   Arboriculturist in the Last Year: Patient refused  Transportation Needs: Unknown   Film/video editor (Medical): Patient refused   Lack of Transportation (Non-Medical): Patient refused  Physical Activity: Inactive   Days of Exercise per Week: 0 days   Minutes of Exercise per Session: 0 min  Stress: No Stress Concern Present   Feeling of Stress : Not at all  Social Connections: Moderately Isolated   Frequency of Communication with Friends and Family: More than three times a week   Frequency of Social Gatherings with Friends and Family: More than three times a week   Attends Religious Services: Never   Marine scientist or Organizations: No   Attends Archivist Meetings: Never   Marital  Status: Married    Family History        Family History  Problem Relation Age of Onset   Liver disease Mother        fatty liver, s/p liver biopsy   Alcohol abuse Mother    Hypertension Maternal Grandmother    Glaucoma Maternal Grandmother    Diabetes Maternal Grandfather    Heart disease Maternal Grandfather    Breast cancer Neg Hx    Ovarian cancer Neg Hx    Colon cancer Neg Hx     Patient Active Problem List   Diagnosis Date Noted   Right ovarian cyst 06/27/2018   Uterine leiomyoma 06/14/2018   Wheat intolerance 02/15/2018   Dysfunctional uterine bleeding 06/29/2017   Highly echogenic liver on ultrasound 06/15/2017   PLMD (periodic limb movement disorder) 02/01/2016   Microcytosis 02/01/2016   Vitamin D deficiency 02/01/2016   Elevated  alanine aminotransferase (ALT) level 02/01/2016   Hypertriglyceridemia 10/23/2015   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   PCO (polycystic ovaries)    Allergic rhinitis    Morbid obesity (HCC)    Insomnia    Low back pain    Impaired fasting glucose     Past Surgical History:  Procedure Laterality Date   NASAL SINUS SURGERY       Current Outpatient Medications:    cyclobenzaprine (FLEXERIL) 10 MG tablet, Take 1 tablet (10 mg total) by mouth at bedtime., Disp: 30 tablet, Rfl: 1   norgestimate-ethinyl estradiol (ORTHO-CYCLEN) 0.25-35 MG-MCG tablet, TAKE 1 TABLET BY MOUTH EVERY DAY, Disp: 84 tablet, Rfl: 0   pantoprazole (PROTONIX) 40 MG tablet, Take 1 tablet (40 mg total) by mouth 2 (two) times daily., Disp: 60 tablet, Rfl: 1   Semaglutide,0.25 or 0.5MG/DOS, (OZEMPIC, 0.25 OR 0.5 MG/DOSE,) 2 MG/1.5ML SOPN, Inject 0.5 mg into the skin once a week., Disp: 1.5 mL, Rfl: 8  No Known Allergies  Patient Care Team: Delsa Grana, PA-C as PCP - General (Family Medicine) Beverly Gust, MD as Consulting Physician (Otolaryngology) Arnetha Courser, MD as Referring Physician (Family Medicine) Bary Castilla Forest Gleason, MD as Consulting Physician (General Surgery)  Review of Systems  Constitutional: Negative.   HENT: Negative.    Eyes: Negative.   Respiratory: Negative.    Cardiovascular: Negative.   Gastrointestinal: Negative.   Endocrine: Negative.   Genitourinary: Negative.   Musculoskeletal: Negative.   Skin: Negative.   Allergic/Immunologic: Negative.   Neurological: Negative.   Hematological: Negative.   Psychiatric/Behavioral: Negative.    All other systems reviewed and are negative.   I personally reviewed active problem list, medication list, allergies, family history, social history, health maintenance, notes from last encounter, lab results, imaging with the patient/caregiver today.        Objective:   Vitals:  Vitals:   07/02/21 1045  BP: 124/84  Pulse: 85  Resp: 16  Temp: 97.9  F (36.6 C)  SpO2: 96%  Weight: 262 lb 1.6 oz (118.9 kg)  Height: '5\' 5"'  (1.651 m)    Body mass index is 43.62 kg/m.  Physical Exam Vitals and nursing note reviewed.  Constitutional:      General: She is not in acute distress.    Appearance: Normal appearance. She is well-developed. She is obese. She is not ill-appearing, toxic-appearing or diaphoretic.     Interventions: Face mask in place.  HENT:     Head: Normocephalic and atraumatic.     Right Ear: External ear normal.     Left Ear: External ear normal.  Eyes:  General: Lids are normal. No scleral icterus.       Right eye: No discharge.        Left eye: No discharge.     Conjunctiva/sclera: Conjunctivae normal.  Neck:     Trachea: Phonation normal. No tracheal deviation.  Cardiovascular:     Rate and Rhythm: Normal rate and regular rhythm.     Pulses: Normal pulses.          Radial pulses are 2+ on the right side and 2+ on the left side.       Posterior tibial pulses are 2+ on the right side and 2+ on the left side.     Heart sounds: Normal heart sounds. No murmur heard.   No friction rub. No gallop.  Pulmonary:     Effort: Pulmonary effort is normal. No respiratory distress.     Breath sounds: Normal breath sounds. No stridor. No wheezing, rhonchi or rales.  Chest:     Chest wall: No tenderness.  Abdominal:     General: Bowel sounds are normal. There is no distension.     Palpations: Abdomen is soft.  Musculoskeletal:     Right lower leg: No edema.     Left lower leg: No edema.  Skin:    General: Skin is warm and dry.     Coloration: Skin is not jaundiced or pale.     Findings: No rash.  Neurological:     Mental Status: She is alert.     Motor: No abnormal muscle tone.     Gait: Gait normal.  Psychiatric:        Mood and Affect: Mood normal.        Speech: Speech normal.        Behavior: Behavior normal.      Fall Risk: Fall Risk  07/02/2021 04/16/2021 09/25/2020 06/25/2020 05/15/2020  Falls in the past  year? 0 0 0 0 0  Number falls in past yr: 0 0 0 0 0  Injury with Fall? 0 0 0 0 0  Follow up - - - - Falls evaluation completed    Functional Status Survey: Is the patient deaf or have difficulty hearing?: No Does the patient have difficulty seeing, even when wearing glasses/contacts?: No Does the patient have difficulty concentrating, remembering, or making decisions?: No Does the patient have difficulty walking or climbing stairs?: No Does the patient have difficulty dressing or bathing?: No Does the patient have difficulty doing errands alone such as visiting a doctor's office or shopping?: No   Assessment & Plan:    CPE completed today  USPSTF grade A and B recommendations reviewed with patient; age-appropriate recommendations, preventive care, screening tests, etc discussed and encouraged; healthy living encouraged; see AVS for patient education given to patient  Discussed importance of 150 minutes of physical activity weekly, AHA exercise recommendations given to pt in AVS/handout  Discussed importance of healthy diet:  eating lean meats and proteins, avoiding trans fats and saturated fats, avoid simple sugars and excessive carbs in diet, eat 6 servings of fruit/vegetables daily and drink plenty of water and avoid sweet beverages.    Recommended pt to do annual eye exam and routine dental exams/cleanings  Depression, alcohol, fall screening completed as documented above and per flowsheets  Reviewed Health Maintenance: Health Maintenance  Topic Date Due   PAP SMEAR-Modifier  07/04/2020   INFLUENZA VACCINE  07/26/2021   TETANUS/TDAP  11/28/2021   COVID-19 Vaccine  Completed   Hepatitis C Screening  Completed   HIV Screening  Completed   Pneumococcal Vaccine 71-19 Years old  Aged Out   HPV VACCINES  Aged Out    Immunizations: Immunization History  Administered Date(s) Administered   Influenza Inj Mdck Quad Pf 10/11/2019   Influenza-Unspecified 10/23/2014, 09/11/2015    PFIZER(Purple Top)SARS-COV-2 Vaccination 04/03/2020, 04/29/2020, 12/12/2020   Tdap 11/29/2011     ICD-10-CM   1. Annual physical exam  T90.30 COMPLETE METABOLIC PANEL WITH GFR    Lipid panel    CBC w/Diff/Platelet    Hemoglobin A1C    2. Encounter for surveillance of contraceptive pills  Z30.41 norgestimate-ethinyl estradiol (ORTHO-CYCLEN) 0.25-35 MG-MCG tablet   BP good, non-smoker, discussed risk, benefits, consistent daily administration for effectiveness, refills given, monogomous, low risk, no need for STD    3. Metabolic syndrome  S92.33 Lipid panel    CBC w/Diff/Platelet    Hemoglobin A1C   see below     4. BMI 40.0-44.9, adult (HCC)  Z68.41 Lipid panel    CBC w/Diff/Platelet    Hemoglobin A1C   on ozempic, no weight change, monitoring A1C, hx of PCOS, long discussion today on cutting out soda and limiting high calorie low nutrient foods/drinks    5. Dyslipidemia  E78.5 Lipid panel   not on statin, recheck labs    6. Impaired fasting glucose  R73.01 Hemoglobin A1C   recheck A1C, on ozempic, hx of insulin resistance, prediabetes, PCOS, metabolic syndrome - monitor for Dm, discussion about diet/lifestyle to prevent T2DM    7. Gastroesophageal reflux disease without esophagitis  K21.9 pantoprazole (PROTONIX) 40 MG tablet   decreased to once a day, better with avoiding food triggers, advised to wean off if able with pepcid or other OTC meds and diet/lifestyle efforts    8. Chronic bilateral low back pain without sciatica  M54.50 cyclobenzaprine (FLEXERIL) 10 MG tablet   G89.29    refill on flexeril, uses almost nightly, sometimes higher dose when flared up - if any worsening may need to work up - previously managed by PCP/[facility]    9. RLS (restless legs syndrome)  G25.81 pramipexole (MIRAPEX) 0.25 MG tablet   was supplementing iron for iron deficiency, wants meds again, trial of higher dose med with flexeril, reviewed past work up and meds to remind pt of work up           [employer], PA-C 07/02/21 11:24 AM  [place]

## 2021-07-02 NOTE — Patient Instructions (Signed)
Preventive Care 41-41 Years Old, Female Preventive care refers to lifestyle choices and visits with your health care provider that can promote health and wellness. This includes: A yearly physical exam. This is also called an annual wellness visit. Regular dental and eye exams. Immunizations. Screening for certain conditions. Healthy lifestyle choices, such as: Eating a healthy diet. Getting regular exercise. Not using drugs or products that contain nicotine and tobacco. Limiting alcohol use. What can I expect for my preventive care visit? Physical exam Your health care provider will check your: Height and weight. These may be used to calculate your BMI (body mass index). BMI is a measurement that tells if you are at a healthy weight. Heart rate and blood pressure. Body temperature. Skin for abnormal spots. Counseling Your health care provider may ask you questions about your: Past medical problems. Family's medical history. Alcohol, tobacco, and drug use. Emotional well-being. Home life and relationship well-being. Sexual activity. Diet, exercise, and sleep habits. Work and work Statistician. Access to firearms. Method of birth control. Menstrual cycle. Pregnancy history. What immunizations do I need?  Vaccines are usually given at various ages, according to a schedule. Your health care provider will recommend vaccines for you based on your age, medicalhistory, and lifestyle or other factors, such as travel or where you work. What tests do I need? Blood tests Lipid and cholesterol levels. These may be checked every 5 years, or more often if you are over 41 years old. Hepatitis C test. Hepatitis B test. Screening Lung cancer screening. You may have this screening every year starting at age 30 if you have a 30-pack-year history of smoking and currently smoke or have quit within the past 15 years. Colorectal cancer screening. All adults should have this screening starting at  age 23 and continuing until age 41. Your health care provider may recommend screening at age 88 if you are at increased risk. You will have tests every 1-10 years, depending on your results and the type of screening test. Diabetes screening. This is done by checking your blood sugar (glucose) after you have not eaten for a while (fasting). You may have this done every 1-3 years. Mammogram. This may be done every 1-2 years. Talk with your health care provider about when you should start having regular mammograms. This may depend on whether you have a family history of breast cancer. BRCA-related cancer screening. This may be done if you have a family history of breast, ovarian, tubal, or peritoneal cancers. Pelvic exam and Pap test. This may be done every 3 years starting at age 41. Starting at age 54, this may be done every 5 years if you have a Pap test in combination with an HPV test. Other tests STD (sexually transmitted disease) testing, if you are at risk. Bone density scan. This is done to screen for osteoporosis. You may have this scan if you are at high risk for osteoporosis. Talk with your health care provider about your test results, treatment options,and if necessary, the need for more tests. Follow these instructions at home: Eating and drinking  Eat a diet that includes fresh fruits and vegetables, whole grains, lean protein, and low-fat dairy products. Take vitamin and mineral supplements as recommended by your health care provider. Do not drink alcohol if: Your health care provider tells you not to drink. You are pregnant, may be pregnant, or are planning to become pregnant. If you drink alcohol: Limit how much you have to 0-1 drink a day. Be aware  of how much alcohol is in your drink. In the [place] one drink equals one 12 oz bottle of beer (355 mL), one 5 oz glass of wine (148 mL), or one 1 oz glass of hard liquor (44 mL).  Lifestyle Take daily care of your teeth and  gums. Brush your teeth every morning and night with fluoride toothpaste. Floss one time each day. Stay active. Exercise for at least 30 minutes 5 or more days each week. Do not use any products that contain nicotine or tobacco, such as cigarettes, e-cigarettes, and chewing tobacco. If you need help quitting, ask your health care provider. Do not use drugs. If you are sexually active, practice safe sex. Use a condom or other form of protection to prevent STIs (sexually transmitted infections). If you do not wish to become pregnant, use a form of birth control. If you plan to become pregnant, see your health care provider for a prepregnancy visit. If told by your health care provider, take low-dose aspirin daily starting at age 29. Find healthy ways to cope with stress, such as: Meditation, yoga, or listening to music. Journaling. Talking to a trusted person. Spending time with friends and family. Safety Always wear your seat belt while driving or riding in a vehicle. Do not drive: If you have been drinking alcohol. Do not ride with someone who has been drinking. When you are tired or distracted. While texting. Wear a helmet and other protective equipment during sports activities. If you have firearms in your house, make sure you follow all gun safety procedures. What's next? Visit your health care provider once a year for an annual wellness visit. Ask your health care provider how often you should have your eyes and teeth checked. Stay up to date on all vaccines. This information is not intended to replace advice given to you by your health care provider. Make sure you discuss any questions you have with your healthcare provider. Document Revised: 09/15/2020 Document Reviewed: 08/23/2018 Elsevier Patient Education  2022 [employer].

## 2021-07-03 LAB — COMPLETE METABOLIC PANEL WITH GFR
AG Ratio: 1.5 (calc) (ref 1.0–2.5)
ALT: 25 U/L (ref 6–29)
AST: 17 U/L (ref 10–30)
Albumin: 4 g/dL (ref 3.6–5.1)
Alkaline phosphatase (APISO): 90 U/L (ref 31–125)
BUN: 11 mg/dL (ref 7–25)
CO2: 26 mmol/L (ref 20–32)
Calcium: 8.9 mg/dL (ref 8.6–10.2)
Chloride: 103 mmol/L (ref 98–110)
Creat: 0.68 mg/dL (ref 0.50–1.10)
GFR, Est African American: 126 mL/min/{1.73_m2} (ref >=60)
GFR, Est Non African American: 109 mL/min/{1.73_m2} (ref >=60)
Globulin: 2.7 g/dL (calc) (ref 1.9–3.7)
Glucose, Bld: 81 mg/dL (ref 65–99)
Potassium: 4.2 mmol/L (ref 3.5–5.3)
Sodium: 137 mmol/L (ref 135–146)
Total Bilirubin: 0.4 mg/dL (ref 0.2–1.2)
Total Protein: 6.7 g/dL (ref 6.1–8.1)

## 2021-07-03 LAB — CBC WITH DIFFERENTIAL/PLATELET
Absolute Monocytes: 442 cells/uL (ref 200–950)
Basophils Absolute: 47 cells/uL (ref 0–200)
Basophils Relative: 0.5 %
Eosinophils Absolute: 188 cells/uL (ref 15–500)
Eosinophils Relative: 2 %
HCT: 38.2 % (ref 35.0–45.0)
Hemoglobin: 12.2 g/dL (ref 11.7–15.5)
Lymphs Abs: 2012 cells/uL (ref 850–3900)
MCH: 26.1 pg — ABNORMAL LOW (ref 27.0–33.0)
MCHC: 31.9 g/dL — ABNORMAL LOW (ref 32.0–36.0)
MCV: 81.8 fL (ref 80.0–100.0)
MPV: 9.8 fL (ref 7.5–12.5)
Monocytes Relative: 4.7 %
Neutro Abs: 6712 cells/uL (ref 1500–7800)
Neutrophils Relative %: 71.4 %
Platelets: 316 10*3/uL (ref 140–400)
RBC: 4.67 10*6/uL (ref 3.80–5.10)
RDW: 15.5 % — ABNORMAL HIGH (ref 11.0–15.0)
Total Lymphocyte: 21.4 %
WBC: 9.4 10*3/uL (ref 3.8–10.8)

## 2021-07-03 LAB — LIPID PANEL
Cholesterol: 157 mg/dL (ref <200)
HDL: 45 mg/dL — ABNORMAL LOW (ref >=50)
LDL Cholesterol (Calc): 85 mg/dL (calc)
Non-HDL Cholesterol (Calc): 112 mg/dL (calc) (ref <130)
Total CHOL/HDL Ratio: 3.5 (calc) (ref <5.0)
Triglycerides: 167 mg/dL — ABNORMAL HIGH (ref <150)

## 2021-07-03 LAB — HEMOGLOBIN A1C
Hgb A1c MFr Bld: 5.6 % of total Hgb (ref <5.7)
Mean Plasma Glucose: 114 mg/dL
eAG (mmol/L): 6.3 mmol/L

## 2021-08-25 ENCOUNTER — Ambulatory Visit
Admission: RE | Admit: 2021-08-25 | Discharge: 2021-08-25 | Disposition: A | Discharge status: Home / Self Care | Payer: 59 | Source: Ambulatory Visit | Attending: Family Medicine | Admitting: Family Medicine

## 2021-08-25 ENCOUNTER — Other Ambulatory Visit: Payer: Self-pay

## 2021-08-25 DIAGNOSIS — N6310 Unspecified lump in the right breast, unspecified quadrant: Secondary | ICD-10-CM

## 2021-08-27 ENCOUNTER — Other Ambulatory Visit: Payer: Self-pay

## 2021-08-27 DIAGNOSIS — R928 Other abnormal and inconclusive findings on diagnostic imaging of breast: Secondary | ICD-10-CM

## 2021-11-27 IMAGING — MG DIGITAL DIAGNOSTIC BILAT W/ TOMO W/ CAD
8 series · 8 of 24 positions shown · non-contrast
Comparison: Previous exam(s).

CLINICAL DATA: 40-year-old female presenting for annual bilateral
mammogram and 1 year follow-up of a probably benign right breast
mass.

EXAM:
DIGITAL DIAGNOSTIC BILATERAL MAMMOGRAM WITH TOMOSYNTHESIS AND CAD;
ULTRASOUND RIGHT BREAST LIMITED
TECHNIQUE: Bilateral digital diagnostic mammography and breast tomosynthesis
was performed. The images were evaluated with computer-aided
detection.; Targeted ultrasound examination of the right breast was
performed

[L CC synth-2D]
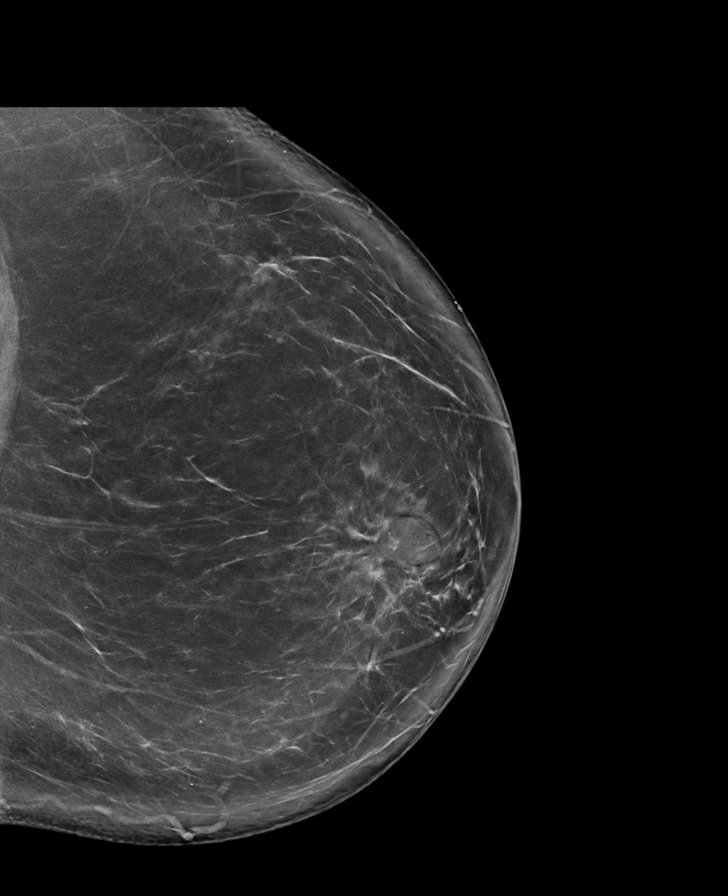

[R CC synth-2D]
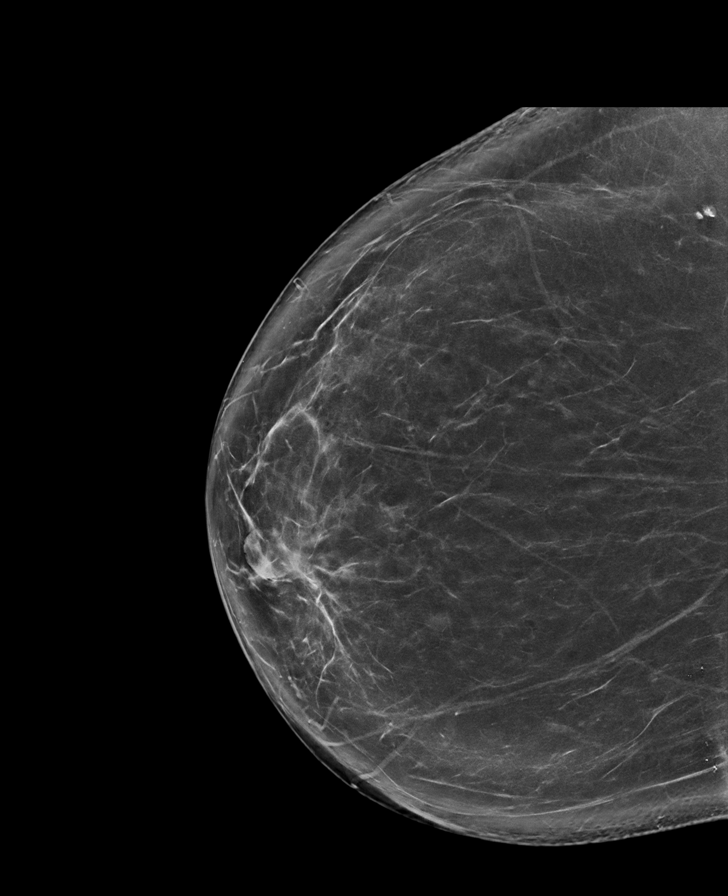

[L MLO synth-2D]
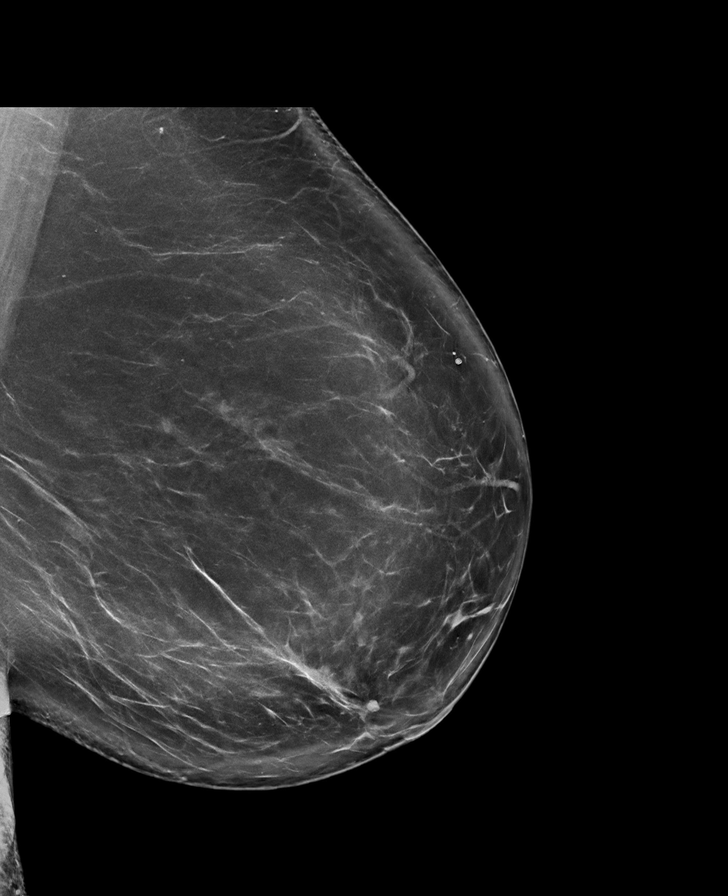

[R MLO synth-2D]
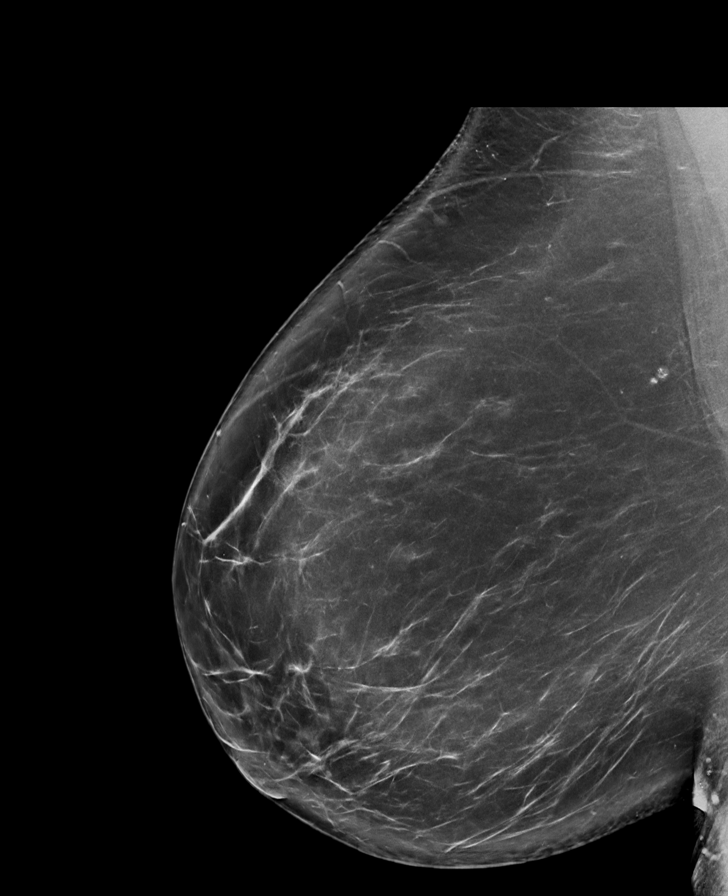

[L CC tomo · tomo slice 52/103.0]
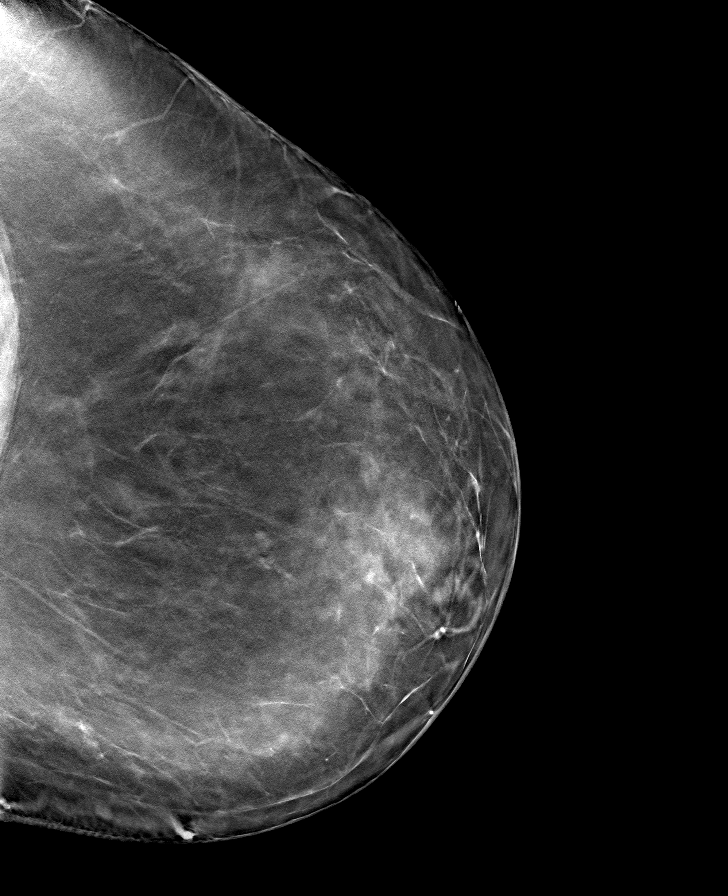

[L MLO tomo · tomo slice 55/108.0]
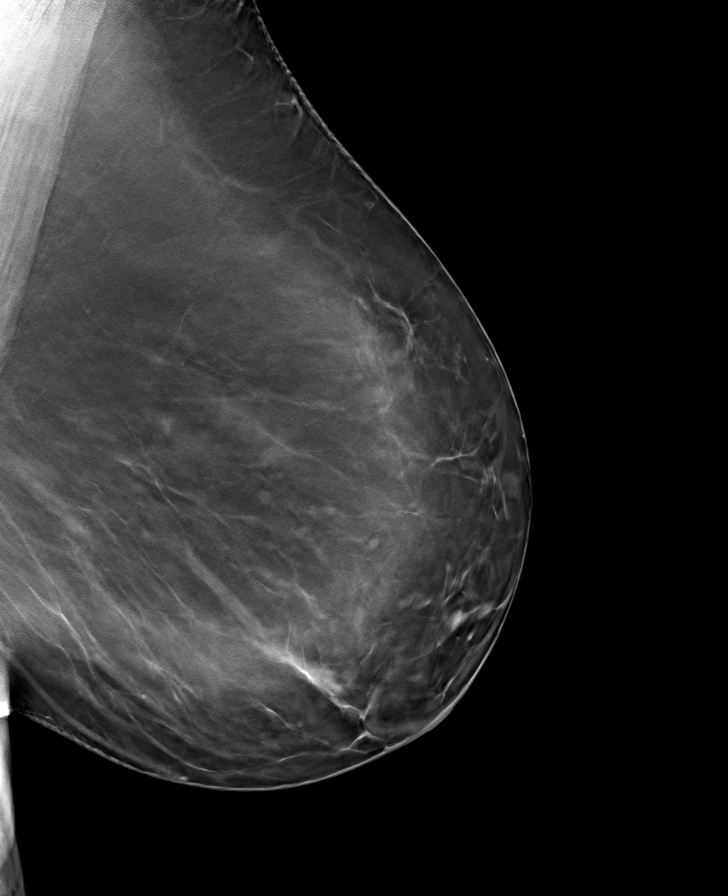

[R MLO tomo · tomo slice 54/107.0]
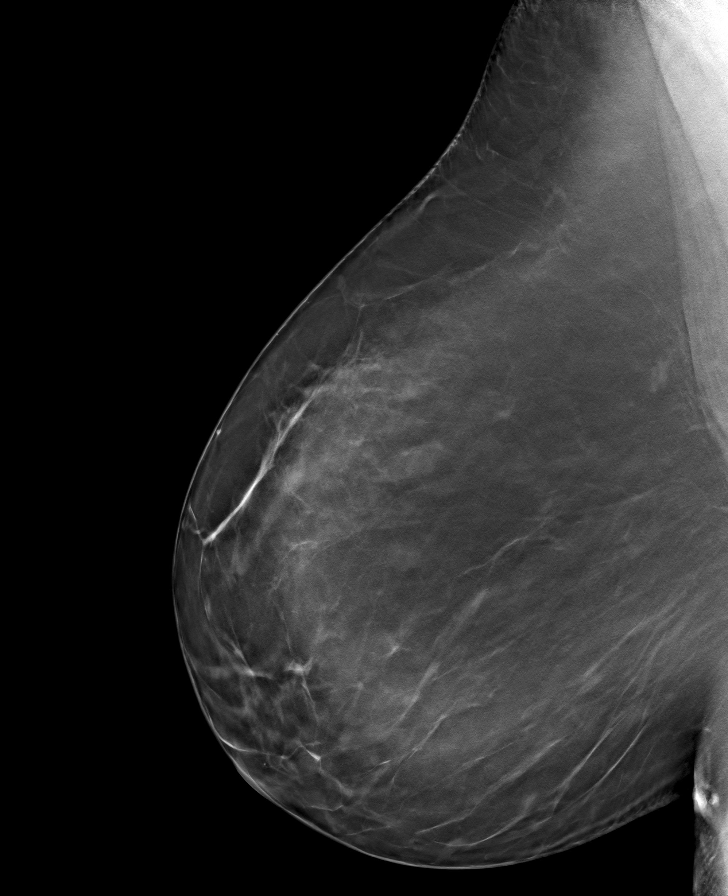

[R CC tomo · tomo slice 49/97.0]
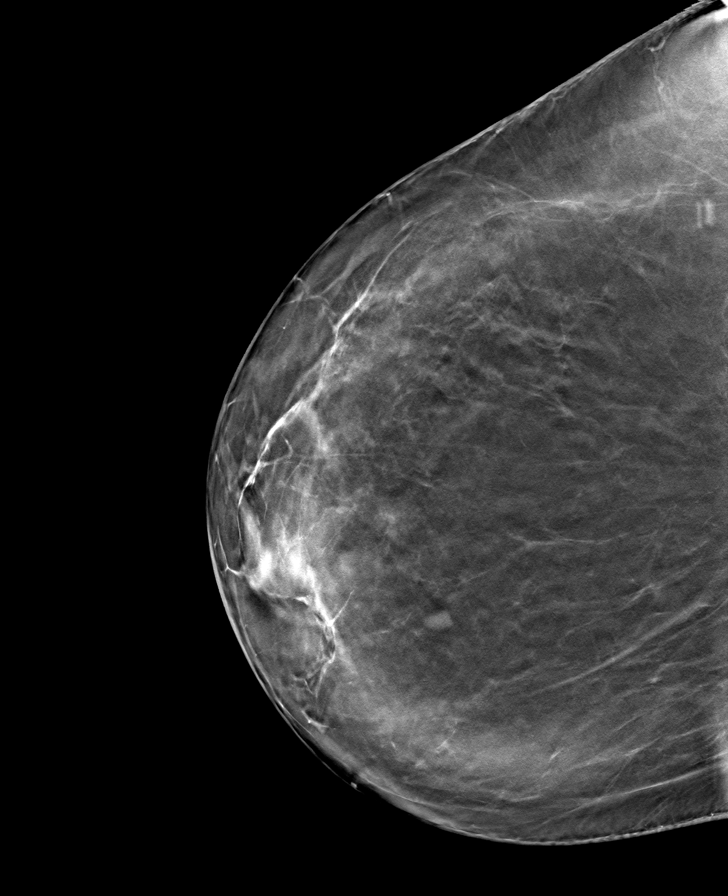

[8 of 24 positions shown; findings below may reference images not displayed]

ACR Breast Density Category b: There are scattered areas of
fibroglandular density.
FINDINGS: Stable mammographic appearance of the bilateral breasts. No new or
suspicious mammographic findings.

Targeted ultrasound is performed, showing stable appearance of an
oval, circumscribed hypoechoic mass at the 1 o'clock position 8 cm
from the nipple. It measures 5 x 3 x 6 mm (previously 5 x 3 x 7 mm).
IMPRESSION: 1. Stable, probably benign right breast mass. Recommend continued
short-term imaging follow-up.
2. No mammographic evidence of malignancy on the left.

RECOMMENDATION:
Bilateral diagnostic mammogram and right breast ultrasound in 1
year.

I have discussed the findings and recommendations with the patient.
If applicable, a reminder letter will be sent to the patient
regarding the next appointment.

BI-RADS CATEGORY  3: Probably benign.

## 2021-12-04 ENCOUNTER — Other Ambulatory Visit: Payer: Self-pay | Admitting: Family Medicine

## 2021-12-04 DIAGNOSIS — G2581 Restless legs syndrome: Secondary | ICD-10-CM

## 2021-12-04 NOTE — Telephone Encounter (Signed)
Requested medication (s) are due for refill today: yes  Requested medication (s) are on the active medication list: yes  Last refill:  07/02/21 #30 5 RF  Future visit scheduled: yes  Notes to clinic:  please review dose. May be titrating to a higher dose.   Requested Prescriptions  Pending Prescriptions Disp Refills   pramipexole (MIRAPEX) 0.25 MG tablet [Pharmacy Med Name: PRAMIPEXOLE 0.25 MG TABLET] 30 tablet 5    Sig: TAKE 1 TABLET BY MOUTH AT BEDTIME AS NEEDED. TAKE 2 TO 3 HOURS PRIOR TO BEDTIME for RLS     Neurology:  Parkinsonian Agents Passed - 12/04/2021 11:07 AM      Passed - Last BP in normal range    BP Readings from Last 1 Encounters:  07/02/21 124/84          Passed - Valid encounter within last 12 months    Recent Outpatient Visits           5 months ago Annual physical exam   [facility] Delsa Grana, PA-C   7 months ago BMI 40.0-44.9, adult [facility])   [facility], Laurita Quint, FNP   1 year ago RLS (restless legs syndrome)   [facility] Delsa Grana, PA-C   1 year ago Gastroesophageal reflux disease without esophagitis   [facility] Delsa Grana, PA-C   1 year ago Gastroesophageal reflux disease without esophagitis   [facility] Steele Sizer, MD       Future Appointments             In 1 month Delsa Grana, PA-C [facility], [facility]

## 2021-12-09 ENCOUNTER — Other Ambulatory Visit: Payer: Self-pay | Admitting: Family Medicine

## 2021-12-09 DIAGNOSIS — M545 Low back pain, unspecified: Secondary | ICD-10-CM

## 2022-01-03 ENCOUNTER — Ambulatory Visit: Payer: 59 | Admitting: Family Medicine

## 2022-02-01 ENCOUNTER — Ambulatory Visit: Payer: 59 | Admitting: Unknown Physician Specialty

## 2022-02-02 ENCOUNTER — Ambulatory Visit (INDEPENDENT_AMBULATORY_CARE_PROVIDER_SITE_OTHER): Payer: 59 | Admitting: Physician Assistant

## 2022-02-02 DIAGNOSIS — Z91199 Patient's noncompliance with other medical treatment and regimen due to unspecified reason: Secondary | ICD-10-CM

## 2022-02-09 ENCOUNTER — Ambulatory Visit (INDEPENDENT_AMBULATORY_CARE_PROVIDER_SITE_OTHER): Payer: 59 | Admitting: Family Medicine

## 2022-02-09 ENCOUNTER — Other Ambulatory Visit: Payer: Self-pay

## 2022-02-09 ENCOUNTER — Encounter: Payer: Self-pay | Admitting: Family Medicine

## 2022-02-09 DIAGNOSIS — L298 Other pruritus: Secondary | ICD-10-CM

## 2022-02-09 DIAGNOSIS — E8881 Metabolic syndrome: Secondary | ICD-10-CM

## 2022-02-09 DIAGNOSIS — Z23 Encounter for immunization: Secondary | ICD-10-CM | POA: Diagnosis not present

## 2022-02-09 DIAGNOSIS — R21 Rash and other nonspecific skin eruption: Secondary | ICD-10-CM

## 2022-02-09 DIAGNOSIS — R7301 Impaired fasting glucose: Secondary | ICD-10-CM

## 2022-02-09 DIAGNOSIS — G8929 Other chronic pain: Secondary | ICD-10-CM

## 2022-02-09 DIAGNOSIS — E785 Hyperlipidemia, unspecified: Secondary | ICD-10-CM

## 2022-02-09 DIAGNOSIS — E559 Vitamin D deficiency, unspecified: Secondary | ICD-10-CM

## 2022-02-09 DIAGNOSIS — G2581 Restless legs syndrome: Secondary | ICD-10-CM

## 2022-02-09 DIAGNOSIS — K219 Gastro-esophageal reflux disease without esophagitis: Secondary | ICD-10-CM | POA: Diagnosis not present

## 2022-02-09 DIAGNOSIS — M545 Low back pain, unspecified: Secondary | ICD-10-CM

## 2022-02-09 DIAGNOSIS — H579 Unspecified disorder of eye and adnexa: Secondary | ICD-10-CM

## 2022-02-09 MED ORDER — SEMAGLUTIDE (1 MG/DOSE) 4 MG/3ML ~~LOC~~ SOPN: 1.0000 mg | PEN_INJECTOR | SUBCUTANEOUS | 1 refills | Status: DC

## 2022-02-09 MED ORDER — PRAMIPEXOLE DIHYDROCHLORIDE 0.5 MG PO TABS: 0.5000 mg | ORAL_TABLET | Freq: Every day | ORAL | 1 refills | Status: DC

## 2022-02-09 MED ORDER — CLOTRIMAZOLE 1 % EX CREA: 1.0000 "application " | TOPICAL_CREAM | Freq: Two times a day (BID) | CUTANEOUS | 0 refills | Status: DC

## 2022-02-09 MED ORDER — CYCLOBENZAPRINE HCL 10 MG PO TABS: 5.0000 mg | ORAL_TABLET | Freq: Every day | ORAL | 1 refills | Status: DC

## 2022-02-09 NOTE — Progress Notes (Signed)
Name: Connie Sims   MRN: [id number]    DOB: 27-Feb-1981   Date:02/09/2022       Progress Note  Subjective  Chief Complaint  Check Up  HPI  Metabolic Syndrome: she has a long history of PCO's , A1C elevation, triglycerides elevation and also fatty liver. She tried Metformin but caused GI symptoms, switched to Actos last year but we switched to GLP-1 agonist - Ozempic May 2021  due to associated morbid obesity. She denies polyphagia, polydipsia or polyuria. Tolerating lower dose of Ozempic we will try going up to 1 mg weekly  Morbid obesity: she has always been overweight ( childhood), but worse since age 36 - she went from a size 12/14 to a size 20 ( even though she was in a marching band in college) She likes sodas 40 oz a day, she is not very physically active. 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 Discussed again importance of healthy diet, continue portion control and increase physical activity   Vitamin D : she does not like taking medications, Ii gave her rx in the past but ran out of refills.   History of anemia : last HCT was normal, iron studies in the past showed normal ferritin   Low back pain: intermittent , she has osteopetrosis , familial. She sees chiropractor intermittently   Left eye pruritis: she developed pruritis and watery left eye only last night, this morning some burning, no change in vision, no redness.   Rash on left axilla going on for months, she has not tried any medications yet. Initially itchy but better now, no pain or oozing   Patient Active Problem List   Diagnosis Date Noted   Right ovarian cyst 06/27/2018   Uterine leiomyoma 06/14/2018   Wheat intolerance 02/15/2018   Dysfunctional uterine bleeding 06/29/2017   Highly echogenic liver on ultrasound 06/15/2017   PLMD (periodic limb movement disorder) 02/01/2016   Microcytosis 02/01/2016   Vitamin D deficiency 02/01/2016   Elevated alanine  aminotransferase (ALT) level 02/01/2016   Hypertriglyceridemia 10/23/2015   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   PCO (polycystic ovaries)    Allergic rhinitis    Morbid obesity (HCC)    Insomnia    Low back pain    Impaired fasting glucose     Past Surgical History:  Procedure Laterality Date   NASAL SINUS SURGERY      Family History  Problem Relation Age of Onset   Liver disease Mother        fatty liver, s/p liver biopsy   Alcohol abuse Mother    Hypertension Maternal Grandmother    Glaucoma Maternal Grandmother    Diabetes Maternal Grandfather    Heart disease Maternal Grandfather    Breast cancer Neg Hx    Ovarian cancer Neg Hx    Colon cancer Neg Hx     Social History   Tobacco Use   Smoking status: Never   Smokeless tobacco: Never  Substance Use Topics   Alcohol use: No    Alcohol/week: 0.0 standard drinks     Current Outpatient Medications:    cyclobenzaprine (FLEXERIL) 10 MG tablet, Take 0.5-1 tablets (5-10 mg total) by mouth at bedtime., Disp: 90 tablet, Rfl: 1   norgestimate-ethinyl estradiol (ORTHO-CYCLEN) 0.25-35 MG-MCG tablet, Take 1 tablet by mouth daily., Disp: 84 tablet, Rfl: 3   pantoprazole (PROTONIX) 40 MG tablet, Take 1 tablet (40 mg total) by mouth daily before  breakfast., Disp: 90 tablet, Rfl: 3   pramipexole (MIRAPEX) 0.25 MG tablet, TAKE 1 TABLET BY MOUTH AT BEDTIME AS NEEDED. TAKE 2 TO 3 HOURS PRIOR TO BEDTIME FOR RLS, Disp: 30 tablet, Rfl: 5   Semaglutide,0.25 or 0.5MG /DOS, (OZEMPIC, 0.25 OR 0.5 MG/DOSE,) 2 MG/1.5ML SOPN, Inject 0.5 mg into the skin once a week., Disp: 1.5 mL, Rfl: 8  No Known Allergies  I personally reviewed active problem list, medication list, allergies, family history, social history, health maintenance with the patient/caregiver today.   ROS  Constitutional: Negative for fever or weight change.  Respiratory: Negative for cough and shortness of breath.   Cardiovascular: Negative for chest pain or palpitations.   Gastrointestinal: Negative for abdominal pain, no bowel changes.  Musculoskeletal: Negative for gait problem or joint swelling.  Skin: Negative for rash.  Neurological: Negative for dizziness or headache.  No other specific complaints in a complete review of systems (except as listed in HPI above).   Objective  Vitals:   02/09/22 1313  BP: 116/70  Pulse: 91  Resp: 16  SpO2: 97%  Weight: 267 lb (121.1 kg)  Height: 5\' 5"  (1.651 m)    Body mass index is 44.43 kg/m.  Physical Exam  Constitutional: Patient appears well-developed and well-nourished. Obese  No distress.  HEENT: head atraumatic, normocephalic, pupils equal and reactive to light, conjunctiva is intact, normal EOMI , neck supple Cardiovascular: Normal rate, regular rhythm and normal heart sounds.  No murmur heard. No BLE edema. Pulmonary/Chest: Effort normal and breath sounds normal. No respiratory distress. Abdominal: Soft.  There is no tenderness. Skin: mild erythema on left axilla  Psychiatric: Patient has a normal mood and affect. behavior is normal. Judgment and thought content normal.   PHQ2/9: Depression screen [facility] 2/9 02/09/2022 07/02/2021 04/16/2021 09/25/2020 06/25/2020  Decreased Interest 0 0 0 0 0  Down, Depressed, Hopeless 0 0 0 0 0  PHQ - 2 Score 0 0 0 0 0  Altered sleeping 3 0 - - 3  Tired, decreased energy 0 0 - - 1  Change in appetite 0 0 - - 0  Feeling bad or failure about yourself  0 0 - - 0  Trouble concentrating 0 0 - - 0  Moving slowly or fidgety/restless 0 0 - - 0  Suicidal thoughts 0 0 - - 0  PHQ-9 Score 3 0 - - 4  Difficult doing work/chores - Not difficult at all - - Not difficult at all    phq 9 is negative   Fall Risk: Fall Risk  02/09/2022 07/02/2021 04/16/2021 09/25/2020 06/25/2020  Falls in the past year? 0 0 0 0 0  Number falls in past yr: 0 0 0 0 0  Injury with Fall? 0 0 0 0 0  Risk for fall due to : No Fall Risks - - - -  Follow up Falls prevention discussed - - - -       Functional Status Survey: Is the patient deaf or have difficulty hearing?: No Does the patient have difficulty seeing, even when wearing glasses/contacts?: No Does the patient have difficulty concentrating, remembering, or making decisions?: No Does the patient have difficulty walking or climbing stairs?: No Does the patient have difficulty dressing or bathing?: No Does the patient have difficulty doing errands alone such as visiting a doctor's office or shopping?: No    Assessment & Plan  1. Metabolic syndrome  We will increase dose of Ozempic   2. Impaired fasting glucose   3. Gastroesophageal  reflux disease without esophagitis   4. Morbid obesity ([place])  Discussed with the patient the risk posed by an increased BMI. Discussed importance of portion control, calorie counting and at least 150 minutes of physical activity weekly. Avoid sweet beverages and drink more water. Eat at least 6 servings of fruit and vegetables daily    5. Need for immunization against influenza  - Flu Vaccine QUAD 6+ mos PF IM (Fluarix Quad PF)  6. Dyslipidemia   7. Rash  We will try antifungal medication   8. Pruritus of eye  Reassurance given, keep it clean but if no improvement or gets worse please go to eye doctor   9. Need for Tdap vaccination  We will get records from local pharmacy   10. Vitamin D deficiency  - Semaglutide, 1 MG/DOSE, 4 MG/3ML SOPN; Inject 1 mg as directed once a week.  Dispense: 9 mL; Refill: 1  11. Chronic bilateral low back pain without sciatica  - cyclobenzaprine (FLEXERIL) 10 MG tablet; Take 0.5-1 tablets (5-10 mg total) by mouth at bedtime.  Dispense: 90 tablet; Refill: 1  12. RLS (restless legs syndrome)  - pramipexole (MIRAPEX) 0.5 MG tablet; Take 1 tablet (0.5 mg total) by mouth at bedtime.  Dispense: 90 tablet; Refill: 1

## 2022-02-09 NOTE — Addendum Note (Signed)
Addended by: Royal Hawthorn on: 02/09/2022 02:37 PM   Modules accepted: Orders

## 2022-02-11 ENCOUNTER — Other Ambulatory Visit: Payer: Self-pay | Admitting: Family Medicine

## 2022-02-11 DIAGNOSIS — R21 Rash and other nonspecific skin eruption: Secondary | ICD-10-CM

## 2022-02-13 ENCOUNTER — Encounter: Payer: Self-pay | Admitting: Family Medicine

## 2022-02-24 ENCOUNTER — Encounter: Payer: Self-pay | Admitting: Family Medicine

## 2022-02-28 ENCOUNTER — Telehealth (INDEPENDENT_AMBULATORY_CARE_PROVIDER_SITE_OTHER): Payer: 59 | Admitting: Family Medicine

## 2022-02-28 DIAGNOSIS — G4761 Periodic limb movement disorder: Secondary | ICD-10-CM

## 2022-02-28 DIAGNOSIS — G4709 Other insomnia: Secondary | ICD-10-CM

## 2022-02-28 MED ORDER — QUVIVIQ 25 MG PO TABS: 25.0000 mg | ORAL_TABLET | Freq: Every day | ORAL | 0 refills | Status: DC

## 2022-02-28 NOTE — Assessment & Plan Note (Signed)
On mirapex with good effect. ?

## 2022-02-28 NOTE — Patient Instructions (Signed)
It was great to see you! ? ?Our plans for today:  ?- We are starting a new medication for your insomnia, Quviviq. Take this 30 minutes prior to bedtime. ?- Come back in 1 month for follow up  ? ?Take care and seek immediate care sooner if you develop any concerns.  ? ?Dr. Ky Barban ? ?

## 2022-02-28 NOTE — Progress Notes (Signed)
Virtual Visit via Video Note ? ?I connected with Connie Sims on 02/28/22 at 11:00 AM EST by a video enabled telemedicine application and verified that I am speaking with the correct person using two identifiers. ? ?Location: ?Patient: home ?Provider: [facility] ?  ?I discussed the limitations of evaluation and management by telemedicine and the availability of in person appointments. The patient expressed understanding and agreed to proceed. ? ?History of Present Illness: ? ?INSOMNIA ?- previously seen 2021 for same. Previously tried trazodone, temazepam, zaleplon, ramelteon, advil PM. ?- h/o RLS on pramipexole. Works well.  ?- does shift work ? ?Duration: years ?Satisfied with sleep quality: no ?Difficulty falling asleep:  not much ?Difficulty staying asleep: yes ?Good sleep hygiene: yes ?Apnea:  1 episode witnessed by husband . Prior sleep study 2017. ?Snoring: yes ?Restless legs/nocturnal leg cramps: yes ?Chronic pain/arthritis: yes ?History of sleep study: yes ? ?  ?Observations/Objective: ? ?Well appearing, in NAD. Speaks in full sentences. Comfortable WOB on RA. No resp distress.  ? ? ?Assessment and Plan: ? ?Problem List Items Addressed This Visit   ? ?  ? Other  ? Insomnia - Primary  ?  Chronic, sleep maintenance insomnia. Not well controlled. Given multiple failures, will trial DORA. F/u in 1 month. ?  ?  ? Relevant Medications  ? Daridorexant HCl (QUVIVIQ) 25 MG TABS  ? PLMD (periodic limb movement disorder)  ?  On mirapex with good effect. ?  ?  ? ? ? ?I discussed the assessment and treatment plan with the patient. The patient was provided an opportunity to ask questions and all were answered. The patient agreed with the plan and demonstrated an understanding of the instructions. ?  ?The patient was advised to call back or seek an in-person evaluation if the symptoms worsen or if the condition fails to improve as anticipated. ? ?I provided 7 minutes of non-face-to-face time during this  encounter. ? ? ?Myles Gip, DO ?

## 2022-02-28 NOTE — Assessment & Plan Note (Signed)
Chronic, sleep maintenance insomnia. Not well controlled. Given multiple failures, will trial DORA. F/u in 1 month. ?

## 2022-03-04 ENCOUNTER — Encounter: Payer: Self-pay | Admitting: Family Medicine

## 2022-03-07 ENCOUNTER — Other Ambulatory Visit (HOSPITAL_COMMUNITY)
Admission: RE | Admit: 2022-03-07 | Discharge: 2022-03-07 | Disposition: A | Discharge status: Home / Self Care | Payer: 59 | Source: Ambulatory Visit | Attending: Family Medicine | Admitting: Family Medicine

## 2022-03-07 ENCOUNTER — Encounter: Payer: Self-pay | Admitting: Family Medicine

## 2022-03-07 ENCOUNTER — Ambulatory Visit (INDEPENDENT_AMBULATORY_CARE_PROVIDER_SITE_OTHER): Payer: 59 | Admitting: Family Medicine

## 2022-03-07 ENCOUNTER — Other Ambulatory Visit: Payer: Self-pay

## 2022-03-07 VITALS — BP 120/70 | HR 100 | Temp 98.2°F | Resp 18 | Ht 65.0 in | Wt 271.3 lb

## 2022-03-07 DIAGNOSIS — Z124 Encounter for screening for malignant neoplasm of cervix: Secondary | ICD-10-CM

## 2022-03-07 DIAGNOSIS — E282 Polycystic ovarian syndrome: Secondary | ICD-10-CM | POA: Diagnosis not present

## 2022-03-07 DIAGNOSIS — D259 Leiomyoma of uterus, unspecified: Secondary | ICD-10-CM | POA: Diagnosis not present

## 2022-03-07 DIAGNOSIS — N938 Other specified abnormal uterine and vaginal bleeding: Secondary | ICD-10-CM | POA: Diagnosis not present

## 2022-03-07 DIAGNOSIS — N939 Abnormal uterine and vaginal bleeding, unspecified: Secondary | ICD-10-CM

## 2022-03-07 LAB — POCT URINE PREGNANCY: Preg Test, Ur: NEGATIVE

## 2022-03-07 MED ORDER — IBUPROFEN 800 MG PO TABS: 800.0000 mg | ORAL_TABLET | Freq: Three times a day (TID) | ORAL | 0 refills | Status: AC

## 2022-03-07 MED ORDER — IBUPROFEN 800 MG PO TABS: 800.0000 mg | ORAL_TABLET | Freq: Three times a day (TID) | ORAL | 0 refills | Status: DC | PRN

## 2022-03-07 NOTE — Assessment & Plan Note (Addendum)
No obvious findings on bedside ultrasound or pelvic exam. Obtaining labs and formal pelvic US to assess for contributors, obesity likely contributing. Rx scheduled NSAIDs. Continue OCP. Recommend weight loss.  ?

## 2022-03-07 NOTE — Addendum Note (Signed)
Addended by: Myles Gip on: 03/07/2022 11:51 AM ? ? Modules accepted: Orders ? ?

## 2022-03-07 NOTE — Progress Notes (Signed)
? ?  SUBJECTIVE:  ? ?CHIEF COMPLAINT / HPI:  ? ?Abnormal menstrual bleeding ?- G0P0 ?- Menses: typically q4-5 weeks, 3-4 days, light flow usually. Current cycle ongoing >2 weeks. Completely soaking tampon q4 hours with clotting.  ?- LMP: 02/19/22. Currently bleeding. ?- Contraception: OCP ?- Cancer screening: 2018 negative PAP with negative HPV ?- not currently sexually active. ?- denies abnormal vaginal discharge, rashes, ulcers.  ?- some pelvic pain ?- denies intermenstrual bleeding. ?- no CP, SOB, abd pain ?- has h/o fibroids. Pelvic US 2019 with normal appearing uterus and ovaries.  ?- no prior GYN procedures, abnormal pap smears ? ? ?OBJECTIVE:  ? ?BP 120/70   Pulse 100   Temp 98.2 ?F (36.8 ?C) (Oral)   Resp 18   Ht '5\' 5"'$  (1.651 m)   Wt 271 lb 4.8 oz (123.1 kg)   SpO2 97%   BMI 45.15 kg/m?   ?Gen: well appearing, in NAD ?GYN:  External genitalia within normal limits.  Vaginal mucosa pink, moist, normal rugae.  Nonfriable cervix without lesions. Copious bleeding noted on speculum exam.  Bimanual exam difficult due to body habitus, no enlarged uterus or adnexal masses appreciated.  No cervical motion tenderness.  ?Ext: WWP, no edema ? ?Limited transabdominal pelvic ultrasound ?Findings: anteverted uterus without appreciable masses. Endometrial thickness difficult to appreciate.  ?Impression: normal, nongravid uterus without appreciable fibroid. ? ? ?ASSESSMENT/PLAN:  ? ?Dysfunctional uterine bleeding ?No obvious findings on bedside ultrasound or pelvic exam. Obtaining labs and formal pelvic US to assess for contributors, obesity likely contributing. Rx scheduled NSAIDs. Continue OCP. Recommend weight loss.  ?  ? ? ?Myles Gip, DO ?

## 2022-03-08 LAB — CYTOLOGY - PAP
Chlamydia: NEGATIVE
Comment: NEGATIVE
Comment: NEGATIVE
Comment: NEGATIVE
Comment: NORMAL
Diagnosis: NEGATIVE
High risk HPV: NEGATIVE
Neisseria Gonorrhea: NEGATIVE
Trichomonas: NEGATIVE

## 2022-03-08 LAB — CBC WITH DIFFERENTIAL/PLATELET
Absolute Monocytes: 437 cells/uL (ref 200–950)
Basophils Absolute: 28 cells/uL (ref 0–200)
Basophils Relative: 0.3 %
Eosinophils Absolute: 177 cells/uL (ref 15–500)
Eosinophils Relative: 1.9 %
HCT: 36.1 % (ref 35.0–45.0)
Hemoglobin: 11.4 g/dL — ABNORMAL LOW (ref 11.7–15.5)
Lymphs Abs: 1962 cells/uL (ref 850–3900)
MCH: 25.9 pg — ABNORMAL LOW (ref 27.0–33.0)
MCHC: 31.6 g/dL — ABNORMAL LOW (ref 32.0–36.0)
MCV: 82 fL (ref 80.0–100.0)
MPV: 10 fL (ref 7.5–12.5)
Monocytes Relative: 4.7 %
Neutro Abs: 6696 cells/uL (ref 1500–7800)
Neutrophils Relative %: 72 %
Platelets: 291 10*3/uL (ref 140–400)
RBC: 4.4 10*6/uL (ref 3.80–5.10)
RDW: 15.8 % — ABNORMAL HIGH (ref 11.0–15.0)
Total Lymphocyte: 21.1 %
WBC: 9.3 10*3/uL (ref 3.8–10.8)

## 2022-03-08 LAB — BASIC METABOLIC PANEL
BUN: 11 mg/dL (ref 7–25)
CO2: 27 mmol/L (ref 20–32)
Calcium: 9 mg/dL (ref 8.6–10.2)
Chloride: 103 mmol/L (ref 98–110)
Creat: 0.73 mg/dL (ref 0.50–0.99)
Glucose, Bld: 91 mg/dL (ref 65–99)
Potassium: 4.5 mmol/L (ref 3.5–5.3)
Sodium: 138 mmol/L (ref 135–146)

## 2022-03-08 LAB — TSH: TSH: 2.33 mIU/L

## 2022-03-15 ENCOUNTER — Encounter: Payer: Self-pay | Admitting: Family Medicine

## 2022-03-16 ENCOUNTER — Other Ambulatory Visit: Payer: Self-pay | Admitting: Family Medicine

## 2022-03-16 MED ORDER — OZEMPIC (0.25 OR 0.5 MG/DOSE) 2 MG/1.5ML ~~LOC~~ SOPN: 0.5000 mg | PEN_INJECTOR | SUBCUTANEOUS | 1 refills | Status: DC

## 2022-03-23 ENCOUNTER — Other Ambulatory Visit: Payer: Self-pay

## 2022-03-31 ENCOUNTER — Other Ambulatory Visit: Payer: Self-pay | Admitting: Family Medicine

## 2022-03-31 DIAGNOSIS — G4709 Other insomnia: Secondary | ICD-10-CM

## 2022-04-01 NOTE — Telephone Encounter (Signed)
I'm not familiar with this med  ?

## 2022-04-04 ENCOUNTER — Encounter: Payer: Self-pay | Admitting: Nurse Practitioner

## 2022-04-04 ENCOUNTER — Ambulatory Visit (INDEPENDENT_AMBULATORY_CARE_PROVIDER_SITE_OTHER): Payer: 59 | Admitting: Nurse Practitioner

## 2022-04-04 ENCOUNTER — Other Ambulatory Visit: Payer: Self-pay

## 2022-04-04 VITALS — BP 112/74 | HR 100 | Temp 97.8°F | Resp 16 | Ht 66.0 in | Wt 271.6 lb

## 2022-04-04 DIAGNOSIS — N939 Abnormal uterine and vaginal bleeding, unspecified: Secondary | ICD-10-CM | POA: Diagnosis not present

## 2022-04-04 DIAGNOSIS — G4709 Other insomnia: Secondary | ICD-10-CM | POA: Diagnosis not present

## 2022-04-04 MED ORDER — QUVIVIQ 25 MG PO TABS: 25.0000 mg | ORAL_TABLET | Freq: Every day | ORAL | 3 refills | Status: DC

## 2022-04-04 NOTE — Progress Notes (Signed)
? ?BP 112/74   Pulse 100   Temp 97.8 ?F (36.6 ?C) (Oral)   Resp 16   Ht '5\' 6"'$  (1.676 m)   Wt 271 lb 9.6 oz (123.2 kg)   SpO2 94%   BMI 43.84 kg/m?   ? ?Subjective:  ? ? Patient ID: Connie Sims, female    DOB: 1981-05-19, 41 y.o.   MRN: [id number] ? ?HPI: ?Connie Sims is a 41 y.o. female ? ?Chief Complaint  ?Patient presents with  ? Follow-up  ?  4 week recheck  ? ?Abnormal vaginal bleeding:  She said the initial bleeding lasted about a week and a half and was heavy.  Then it stopped and about four days later she had her normal cycle.  She says she is not currently bleeding. She says that she used to be seen at [facility].  Discussed plan that if abnormal bleeding returns will send to GYN.  She is not currently taking birth control. She says she was taking it when her bleeding was heavy but it did not help. Discussed lab results from last visit.  ? ?Insomnia: She was started on Quviviq and says that it has really helped her with getting sleep.  She would like to continue using it.  Will send in refills. She says she is getting at least 7 hours of sleep a night when she uses it.  ? ?Relevant past medical, surgical, family and social history reviewed and updated as indicated. Interim medical history since our last visit reviewed. ?Allergies and medications reviewed and updated. ? ?Review of Systems ? ?Constitutional: Negative for fever or weight change.  ?Respiratory: Negative for cough and shortness of breath.   ?Cardiovascular: Negative for chest pain or palpitations.  ?Gastrointestinal: Negative for abdominal pain, no bowel changes.  ?Musculoskeletal: Negative for gait problem or joint swelling.  ?Skin: Negative for rash.  ?Neurological: Negative for dizziness or headache.  ?No other specific complaints in a complete review of systems (except as listed in HPI above).  ? ?   ?Objective:  ?  ?BP 112/74   Pulse 100   Temp 97.8 ?F (36.6 ?C) (Oral)   Resp 16   Ht '5\' 6"'$  (1.676 m)   Wt 271 lb 9.6 oz  (123.2 kg)   SpO2 94%   BMI 43.84 kg/m?   ?Wt Readings from Last 3 Encounters:  ?04/04/22 271 lb 9.6 oz (123.2 kg)  ?03/07/22 271 lb 4.8 oz (123.1 kg)  ?02/09/22 267 lb (121.1 kg)  ?  ?Physical Exam ? ?Constitutional: Patient appears well-developed and well-nourished. Obese  No distress.  ?HEENT: head atraumatic, normocephalic, pupils equal and reactive to light,  neck supple ?Cardiovascular: Normal rate, regular rhythm and normal heart sounds.  No murmur heard. No BLE edema. ?Pulmonary/Chest: Effort normal and breath sounds normal. No respiratory distress. ?Abdominal: Soft.  There is no tenderness. ?Psychiatric: Patient has a normal mood and affect. behavior is normal. Judgment and thought content normal.  ?Results for orders placed or performed in visit on 03/07/22  ?CBC with Differential  ?Result Value Ref Range  ? WBC 9.3 3.8 - 10.8 Thousand/uL  ? RBC 4.40 3.80 - 5.10 Million/uL  ? Hemoglobin 11.4 (L) 11.7 - 15.5 g/dL  ? HCT 36.1 35.0 - 45.0 %  ? MCV 82.0 80.0 - 100.0 fL  ? MCH 25.9 (L) 27.0 - 33.0 pg  ? MCHC 31.6 (L) 32.0 - 36.0 g/dL  ? RDW 15.8 (H) 11.0 - 15.0 %  ? Platelets 291 140 -  400 Thousand/uL  ? MPV 10.0 7.5 - 12.5 fL  ? Neutro Abs 6,696 1,500 - 7,800 cells/uL  ? Lymphs Abs 1,962 850 - 3,900 cells/uL  ? Absolute Monocytes 437 200 - 950 cells/uL  ? Eosinophils Absolute 177 15 - 500 cells/uL  ? Basophils Absolute 28 0 - 200 cells/uL  ? Neutrophils Relative % 72 %  ? Total Lymphocyte 21.1 %  ? Monocytes Relative 4.7 %  ? Eosinophils Relative 1.9 %  ? Basophils Relative 0.3 %  ?TSH  ?Result Value Ref Range  ? TSH 2.33 mIU/L  ?Basic metabolic panel  ?Result Value Ref Range  ? Glucose, Bld 91 65 - 99 mg/dL  ? BUN 11 7 - 25 mg/dL  ? Creat 0.73 0.50 - 0.99 mg/dL  ? BUN/Creatinine Ratio NOT APPLICABLE 6 - 22 (calc)  ? Sodium 138 135 - 146 mmol/L  ? Potassium 4.5 3.5 - 5.3 mmol/L  ? Chloride 103 98 - 110 mmol/L  ? CO2 27 20 - 32 mmol/L  ? Calcium 9.0 8.6 - 10.2 mg/dL  ?POCT urine pregnancy  ?Result Value Ref  Range  ? Preg Test, Ur Negative Negative  ?Cytology - PAP  ?Result Value Ref Range  ? High risk HPV Negative   ? Neisseria Gonorrhea Negative   ? Chlamydia Negative   ? Trichomonas Negative   ? Adequacy    ?  Satisfactory for evaluation; transformation zone component PRESENT.  ? Diagnosis    ?  - Negative for intraepithelial lesion or malignancy (NILM)  ? Comment Normal Reference Range HPV - Negative   ? Comment Normal Reference Range Trichomonas - Negative   ? Comment Normal Reference [place] Chlamydia - Negative   ? Comment    ?  Normal Reference Range Neisseria Gonorrhea - Negative  ? ?   ?Assessment & Plan:  ? ?1. Abnormal uterine bleeding ?-continue to watch for returning abnormal bleeding, if it returns will send to GYN ? ?2. Other insomnia ? ?- Daridorexant HCl (QUVIVIQ) 25 MG TABS; Take 25 mg by mouth at bedtime.  Dispense: 30 tablet; Refill: 3  ? ?Follow up plan: ?Return if symptoms worsen or fail to improve. ? ? ? ? ? ?

## 2022-04-04 NOTE — Addendum Note (Signed)
Addended by: Serafina Royals F on: 04/04/2022 10:30 AM ? ? Modules accepted: Level of Service ? ?

## 2022-04-12 ENCOUNTER — Telehealth: Payer: Self-pay

## 2022-04-13 NOTE — Telephone Encounter (Signed)
Lvm to sch 66mfu from the last date she was seen

## 2022-06-24 ENCOUNTER — Other Ambulatory Visit: Payer: Self-pay | Admitting: Family Medicine

## 2022-06-24 DIAGNOSIS — K219 Gastro-esophageal reflux disease without esophagitis: Secondary | ICD-10-CM

## 2022-06-24 NOTE — Telephone Encounter (Signed)
Requested Prescriptions  Pending Prescriptions Disp Refills  . pantoprazole (PROTONIX) 40 MG tablet [Pharmacy Med Name: PANTOPRAZOLE SOD DR 40 MG TAB] 90 tablet 2    Sig: TAKE 1 TABLET BY MOUTH DAILY BEFORE BREAKFAST     Gastroenterology: Proton Pump Inhibitors Passed - 06/24/2022  3:09 PM      Passed - Valid encounter within last 12 months    Recent Outpatient Visits          2 months ago Abnormal uterine bleeding   [place], FNP   3 months ago Abnormal uterine bleeding   [place], DO   3 months ago Other insomnia   [facility] Rory Percy M, DO   4 months ago Morbid obesity [facility])   [facility] Steele Sizer, MD   4 months ago    [facility] Mecum, Dani Gobble, [place]

## 2022-08-02 ENCOUNTER — Other Ambulatory Visit: Payer: Self-pay | Admitting: Family Medicine

## 2022-08-02 DIAGNOSIS — G2581 Restless legs syndrome: Secondary | ICD-10-CM

## 2022-08-02 NOTE — Telephone Encounter (Signed)
Requested Prescriptions  Pending Prescriptions Disp Refills  . pramipexole (MIRAPEX) 0.5 MG tablet [Pharmacy Med Name: PRAMIPEXOLE 0.5 MG TABLET] 90 tablet 1    Sig: TAKE 1 TABLET BY MOUTH AT BEDTIME.     Neurology:  Parkinsonian Agents Passed - 08/02/2022  2:27 AM      Passed - Last BP in normal range    BP Readings from Last 1 Encounters:  04/04/22 112/74         Passed - Last Heart Rate in normal range    Pulse Readings from Last 1 Encounters:  04/04/22 100         Passed - Valid encounter within last 12 months    Recent Outpatient Visits          4 months ago Abnormal uterine bleeding   [place], FNP   4 months ago Abnormal uterine bleeding   [place], DO   5 months ago Other insomnia   [facility] Rory Percy M, DO   5 months ago Morbid obesity [facility])   [facility] Steele Sizer, MD   6 months ago    [facility] Mecum, Dani Gobble, [place]

## 2022-08-05 ENCOUNTER — Other Ambulatory Visit: Payer: Self-pay | Admitting: Family Medicine

## 2022-08-05 DIAGNOSIS — M545 Low back pain, unspecified: Secondary | ICD-10-CM

## 2022-08-09 ENCOUNTER — Encounter: Payer: 59 | Admitting: Family Medicine

## 2022-08-12 ENCOUNTER — Other Ambulatory Visit: Payer: Self-pay | Admitting: Nurse Practitioner

## 2022-08-12 ENCOUNTER — Encounter: Payer: 59 | Admitting: Family Medicine

## 2022-08-12 ENCOUNTER — Other Ambulatory Visit: Payer: Self-pay | Admitting: Family Medicine

## 2022-08-12 DIAGNOSIS — G4709 Other insomnia: Secondary | ICD-10-CM

## 2022-08-12 NOTE — Telephone Encounter (Signed)
Requested medication (s) are due for refill today -yes  Requested medication (s) are on the active medication list -yes  Future visit scheduled -no  Last refill: 04/04/22 #30 3RF  Notes to clinic: medication not assigned protocol- provider review   Requested Prescriptions  Pending Prescriptions Disp Refills   QUVIVIQ 25 MG TABS [Pharmacy Med Name: QUVIVIQ 25 MG TABLET] 30 tablet 3    Sig: Take 25 mg by mouth at bedtime.     Off-Protocol Failed - 08/12/2022  9:36 AM      Failed - Medication not assigned to a protocol, review manually.      Passed - Valid encounter within last 12 months    Recent Outpatient Visits           4 months ago Abnormal uterine bleeding   [place], FNP   5 months ago Abnormal uterine bleeding   [place], DO   5 months ago Other insomnia   [facility] Rory Percy M, DO   6 months ago Morbid obesity [facility])   [facility] Steele Sizer, MD   6 months ago    [place], [place]                 Requested Prescriptions  Pending Prescriptions Disp Refills   QUVIVIQ 25 MG TABS [Pharmacy Med Name: QUVIVIQ 25 MG TABLET] 30 tablet 3    Sig: Take 25 mg by mouth at bedtime.     Off-Protocol Failed - 08/12/2022  9:36 AM      Failed - Medication not assigned to a protocol, review manually.      Passed - Valid encounter within last 12 months    Recent Outpatient Visits           4 months ago Abnormal uterine bleeding   [place], FNP   5 months ago Abnormal uterine bleeding   [place], DO   5 months ago Other insomnia   [facility] Rory Percy M, DO   6 months ago Morbid obesity [facility])   [facility] Steele Sizer, MD   6 months ago    [facility] Mecum,  Dani Gobble, [place]

## 2022-08-15 NOTE — Telephone Encounter (Signed)
Lvm to sch appt °

## 2022-08-24 ENCOUNTER — Other Ambulatory Visit: Payer: Self-pay | Admitting: Family Medicine

## 2022-08-24 DIAGNOSIS — M545 Low back pain, unspecified: Secondary | ICD-10-CM

## 2022-08-31 ENCOUNTER — Other Ambulatory Visit: Payer: Self-pay | Admitting: Family Medicine

## 2022-09-17 ENCOUNTER — Other Ambulatory Visit: Payer: Self-pay | Admitting: Family Medicine

## 2022-09-21 ENCOUNTER — Other Ambulatory Visit: Payer: Self-pay | Admitting: Family Medicine

## 2022-09-21 DIAGNOSIS — G8929 Other chronic pain: Secondary | ICD-10-CM

## 2022-09-21 NOTE — Telephone Encounter (Signed)
Requested medication (s) are due for refill today: yes  Requested medication (s) are on the active medication list: yes  Last refill:  08/25/22 #30/0  Future visit scheduled: no  Notes to clinic:  Unable to refill per protocol, cannot delegate.    Requested Prescriptions  Pending Prescriptions Disp Refills   cyclobenzaprine (FLEXERIL) 10 MG tablet [Pharmacy Med Name: CYCLOBENZAPRINE 10 MG TABLET] 30 tablet 0    Sig: TAKE 1/2 TO 1 TABLET (5-10 MG TOTAL) BY MOUTH EVERY DAY AT BEDTIME     Not Delegated - Analgesics:  Muscle Relaxants Failed - 09/21/2022  2:06 PM      Failed - This refill cannot be delegated      Passed - Valid encounter within last 6 months    Recent Outpatient Visits           5 months ago Abnormal uterine bleeding   [place], FNP   6 months ago Abnormal uterine bleeding   [place], DO   6 months ago Other insomnia   [facility] Rory Percy M, DO   7 months ago Morbid obesity [facility])   [facility] Steele Sizer, MD   7 months ago    [facility] [place], Dani Gobble, [place]

## 2022-10-06 NOTE — Progress Notes (Signed)
Appointment cancelled, patient did not arrive for scheduled apt.

## 2022-10-07 ENCOUNTER — Other Ambulatory Visit: Payer: Self-pay | Admitting: Family Medicine

## 2022-10-07 DIAGNOSIS — R21 Rash and other nonspecific skin eruption: Secondary | ICD-10-CM

## 2022-10-14 ENCOUNTER — Ambulatory Visit: Payer: 59 | Admitting: Family Medicine

## 2022-10-14 DIAGNOSIS — Z23 Encounter for immunization: Secondary | ICD-10-CM

## 2022-10-28 ENCOUNTER — Ambulatory Visit (INDEPENDENT_AMBULATORY_CARE_PROVIDER_SITE_OTHER): Payer: 59 | Admitting: Family Medicine

## 2022-10-28 ENCOUNTER — Encounter: Payer: Self-pay | Admitting: Family Medicine

## 2022-10-28 ENCOUNTER — Other Ambulatory Visit: Payer: Self-pay | Admitting: Family Medicine

## 2022-10-28 DIAGNOSIS — D649 Anemia, unspecified: Secondary | ICD-10-CM

## 2022-10-28 DIAGNOSIS — E781 Pure hyperglyceridemia: Secondary | ICD-10-CM

## 2022-10-28 DIAGNOSIS — E282 Polycystic ovarian syndrome: Secondary | ICD-10-CM | POA: Diagnosis not present

## 2022-10-28 DIAGNOSIS — Z23 Encounter for immunization: Secondary | ICD-10-CM | POA: Diagnosis not present

## 2022-10-28 DIAGNOSIS — R7303 Prediabetes: Secondary | ICD-10-CM

## 2022-10-28 DIAGNOSIS — E8881 Metabolic syndrome: Secondary | ICD-10-CM

## 2022-10-28 DIAGNOSIS — Z6841 Body Mass Index (BMI) 40.0 and over, adult: Secondary | ICD-10-CM

## 2022-10-28 DIAGNOSIS — G4709 Other insomnia: Secondary | ICD-10-CM

## 2022-10-28 DIAGNOSIS — E559 Vitamin D deficiency, unspecified: Secondary | ICD-10-CM

## 2022-10-28 MED ORDER — OZEMPIC (0.25 OR 0.5 MG/DOSE) 2 MG/1.5ML ~~LOC~~ SOPN: 0.5000 mg | PEN_INJECTOR | SUBCUTANEOUS | 0 refills | Status: DC

## 2022-10-28 NOTE — Assessment & Plan Note (Signed)
Improved with meds, she has 2 refills remaining, request from pharmacy when out

## 2022-10-28 NOTE — Progress Notes (Signed)
Name: Connie Sims   MRN: [id number]    DOB: [birth year]   Date:10/28/2022       Progress Note  Chief Complaint  Patient presents with   Follow-up   Weight Check     Subjective:   Connie Sims is a 41 y.o. female, presents to clinic for med refills  Prediabetic, pcos, BMI 44+ Out of meds for a month due to not being approved Been on meds since Dr. Ancil Boozer  prescribe about 2 years ago Wt Readings from Last 12 Encounters:  10/28/22 273 lb 12.8 oz (124.2 kg)  04/04/22 271 lb 9.6 oz (123.2 kg)  03/07/22 271 lb 4.8 oz (123.1 kg)  02/09/22 267 lb (121.1 kg)  07/02/21 262 lb 1.6 oz (118.9 kg)  04/16/21 262 lb (118.8 kg)  09/25/20 252 lb 4.8 oz (114.4 kg)  06/25/20 262 lb 9.6 oz (119.1 kg)  05/15/20 266 lb 8 oz (120.9 kg)  03/02/20 272 lb 1.6 oz (123.4 kg)  01/08/19 260 lb 11.2 oz (118.3 kg)  09/19/18 250 lb 14.4 oz (113.8 kg)   BMI Readings from Last 5 Encounters:  10/28/22 44.19 kg/m  04/04/22 43.84 kg/m  03/07/22 45.15 kg/m  02/09/22 44.43 kg/m  07/02/21 43.62 kg/m   Diet, cut back soda 2 cans pepsi daily  Did nutritional consult before, cooking more at home to avoid fast foods No exercise Started to gain weight earlier this year and DR. Sowles wanted to increase dose of meds but it was denied   She also mentions needing her insomnia meds refilled - quviviq has #30 and 3 refills last done Aug - should have another month- per chart filled in aug and end of sept - so would have 2 more refills     Current Outpatient Medications:    cyclobenzaprine (FLEXERIL) 10 MG tablet, TAKE 1/2 TO 1 TABLET (5-10 MG TOTAL) BY MOUTH EVERY DAY AT BEDTIME, Disp: 30 tablet, Rfl: 5   pantoprazole (PROTONIX) 40 MG tablet, TAKE 1 TABLET BY MOUTH DAILY BEFORE BREAKFAST, Disp: 90 tablet, Rfl: 3   pramipexole (MIRAPEX) 0.5 MG tablet, TAKE 1 TABLET BY MOUTH AT BEDTIME., Disp: 90 tablet, Rfl: 1   QUVIVIQ 25 MG TABS, TAKE 25 MG BY MOUTH AT BEDTIME., Disp: 30 tablet, Rfl: 3    Semaglutide,0.25 or 0.'5MG'$ /DOS, (OZEMPIC, 0.25 OR 0.5 MG/DOSE,) 2 MG/1.5ML SOPN, Inject 0.5 mg into the skin once a week., Disp: 4.5 mL, Rfl: 1   clotrimazole (CLOTRIMAZOLE AF) 1 % cream, Apply 1 application topically 2 (two) times daily. (Patient not taking: Reported on 10/28/2022), Disp: 60 g, Rfl: 0   norgestimate-ethinyl estradiol (ORTHO-CYCLEN) 0.25-35 MG-MCG tablet, Take 1 tablet by mouth daily. (Patient not taking: Reported on 10/28/2022), Disp: , Rfl:   Patient Active Problem List   Diagnosis Date Noted   Right ovarian cyst 06/27/2018   Uterine leiomyoma 06/14/2018   Wheat intolerance 02/15/2018   Dysfunctional uterine bleeding 06/29/2017   Highly echogenic liver on ultrasound 06/15/2017   PLMD (periodic limb movement disorder) 02/01/2016   Microcytosis 02/01/2016   Vitamin D deficiency 02/01/2016   Elevated alanine aminotransferase (ALT) level 02/01/2016   Hypertriglyceridemia 10/23/2015   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   PCO (polycystic ovaries)    Allergic rhinitis    Morbid obesity ([place])    Insomnia    Low back pain    Impaired fasting glucose     Past Surgical History:  Procedure Laterality Date   NASAL SINUS SURGERY      Family History  Problem Relation Age of Onset   Liver disease Mother        fatty liver, s/p liver biopsy   Alcohol abuse Mother    Hypertension Maternal Grandmother    Glaucoma Maternal Grandmother    Diabetes Maternal Grandfather    Heart disease Maternal Grandfather    Breast cancer Neg Hx    Ovarian cancer Neg Hx    Colon cancer Neg Hx     Social History   Tobacco Use   Smoking status: Never   Smokeless tobacco: Never  Vaping Use   Vaping Use: Never used  Substance Use Topics   Alcohol use: No    Alcohol/week: 0.0 standard drinks of alcohol   Drug use: No     No Known Allergies  Health Maintenance  Topic Date Due   COVID-19 Vaccine (4 - Pfizer series) 11/13/2022 (Originally 02/06/2021)   TETANUS/TDAP  10/29/2023 (Originally  11/28/2021)   PAP SMEAR-Modifier  03/08/2027   INFLUENZA VACCINE  Completed   Hepatitis C Screening  Completed   HIV Screening  Completed   HPV VACCINES  Aged Out    Chart Review Today: I personally reviewed active problem list, medication list, allergies, family history, social history, health maintenance, notes from last encounter, lab results, imaging with the patient/caregiver today.   Review of Systems  Constitutional: Negative.   HENT: Negative.    Eyes: Negative.   Respiratory: Negative.    Cardiovascular: Negative.   Gastrointestinal: Negative.   Endocrine: Negative.   Genitourinary: Negative.   Musculoskeletal: Negative.   Skin: Negative.   Allergic/Immunologic: Negative.   Neurological: Negative.   Hematological: Negative.   Psychiatric/Behavioral: Negative.    All other systems reviewed and are negative.    Objective:   Vitals:   10/28/22 0851  BP: 122/78  Pulse: 93  Resp: 16  Temp: (!) 97.5 F (36.4 C)  TempSrc: Oral  SpO2: 98%  Weight: 273 lb 12.8 oz (124.2 kg)  Height: '5\' 6"'$  (1.676 m)    Body mass index is 44.19 kg/m.  Physical Exam Vitals and nursing note reviewed.  Constitutional:      General: She is not in acute distress.    Appearance: Normal appearance. She is well-developed. She is obese. She is not ill-appearing, toxic-appearing or diaphoretic.  HENT:     Head: Normocephalic and atraumatic.     Nose: Nose normal.  Eyes:     General:        Right eye: No discharge.        Left eye: No discharge.     Conjunctiva/sclera: Conjunctivae normal.  Neck:     Trachea: No tracheal deviation.  Cardiovascular:     Rate and Rhythm: Normal rate and regular rhythm.  Pulmonary:     Effort: Pulmonary effort is normal. No respiratory distress.     Breath sounds: No stridor.  Musculoskeletal:        General: Normal range of motion.  Skin:    General: Skin is warm and dry.     Findings: No rash.  Neurological:     Mental Status: She is alert.      Motor: No abnormal muscle tone.     Coordination: Coordination normal.  Psychiatric:        Mood and Affect: Affect is labile.        Behavior: Behavior normal.         Assessment & Plan:   Problem List Items Addressed This Visit  Endocrine   PCO (polycystic ovaries)   Relevant Medications   Semaglutide,0.25 or 0.'5MG'$ /DOS, (OZEMPIC, 0.25 OR 0.5 MG/DOSE,) 2 MG/1.5ML SOPN   Other Relevant Orders   COMPLETE METABOLIC PANEL WITH GFR   Hemoglobin A1c     Other   Class 3 severe obesity with serious comorbidity and body mass index (BMI) of 40.0 to 44.9 in adult [facility]) - Primary    She did previously see some weight loss with semaglutide but seemed to loose effectiveness and dose was increased earlier this year but denied. She has now been out of meds for about a month She is cooking more at home, previously talked to nutritionist Not exercising or calculating caloric intake Hx of PCOS, prediabetes Will attempt to get meds again and would recommend dose increase with close communication with pt, pharmacy and our CMAs here that do PA's to see why the meds may not be going through      Relevant Medications   Semaglutide,0.25 or 0.'5MG'$ /DOS, (OZEMPIC, 0.25 OR 0.5 MG/DOSE,) 2 MG/1.5ML SOPN   Other Relevant Orders   COMPLETE METABOLIC PANEL WITH GFR   Hemoglobin A1c   TSH   Lipid panel   Insomnia    Improved with meds, she has 2 refills remaining, request from pharmacy when out      Hypertriglyceridemia   Relevant Orders   COMPLETE METABOLIC PANEL WITH GFR   Lipid panel   Vitamin D deficiency   Relevant Orders   COMPLETE METABOLIC PANEL WITH GFR   Other Visit Diagnoses     Need for influenza vaccination       Relevant Orders   Flu Vaccine QUAD 7moIM (Fluarix, Fluzone & Alfiuria Quad PF) (Completed)   Need for Tdap vaccination       Metabolic syndrome       Relevant Medications   Semaglutide,0.25 or 0.'5MG'$ /DOS, (OZEMPIC, 0.25 OR 0.5 MG/DOSE,) 2 MG/1.5ML SOPN    Other Relevant Orders   COMPLETE METABOLIC PANEL WITH GFR   Hemoglobin A1c   TSH   Lipid panel   Prediabetes       Relevant Medications   Semaglutide,0.25 or 0.'5MG'$ /DOS, (OZEMPIC, 0.25 OR 0.5 MG/DOSE,) 2 MG/1.5ML SOPN   Other Relevant Orders   COMPLETE METABOLIC PANEL WITH GFR   Hemoglobin A1c   Anemia, unspecified type       Relevant Orders   CBC with Differential/Platelet (Completed)        Return in about 3 months (around 01/28/2023) for Routine follow-up.   Connie Grana PA-C 10/28/22 8:57 AM

## 2022-10-28 NOTE — Assessment & Plan Note (Signed)
She did previously see some weight loss with semaglutide but seemed to loose effectiveness and dose was increased earlier this year but denied. She has now been out of meds for about a month She is cooking more at home, previously talked to nutritionist Not exercising or calculating caloric intake Hx of PCOS, prediabetes Will attempt to get meds again and would recommend dose increase with close communication with pt, pharmacy and our CMAs here that do PA's to see why the meds may not be going through

## 2022-10-29 LAB — HEMOGLOBIN A1C
Hgb A1c MFr Bld: 6.3 % of total Hgb — ABNORMAL HIGH (ref <5.7)
Mean Plasma Glucose: 134 mg/dL
eAG (mmol/L): 7.4 mmol/L

## 2022-10-29 LAB — COMPLETE METABOLIC PANEL WITH GFR
AG Ratio: 1.4 (calc) (ref 1.0–2.5)
ALT: 26 U/L (ref 6–29)
AST: 15 U/L (ref 10–30)
Albumin: 3.7 g/dL (ref 3.6–5.1)
Alkaline phosphatase (APISO): 112 U/L (ref 31–125)
BUN: 13 mg/dL (ref 7–25)
CO2: 26 mmol/L (ref 20–32)
Calcium: 8.7 mg/dL (ref 8.6–10.2)
Chloride: 103 mmol/L (ref 98–110)
Creat: 0.67 mg/dL (ref 0.50–0.99)
Globulin: 2.7 g/dL (calc) (ref 1.9–3.7)
Glucose, Bld: 96 mg/dL (ref 65–99)
Potassium: 4.3 mmol/L (ref 3.5–5.3)
Sodium: 137 mmol/L (ref 135–146)
Total Bilirubin: 0.4 mg/dL (ref 0.2–1.2)
Total Protein: 6.4 g/dL (ref 6.1–8.1)
eGFR: 112 mL/min/{1.73_m2} (ref >=60)

## 2022-10-29 LAB — LIPID PANEL
Cholesterol: 143 mg/dL (ref <200)
HDL: 39 mg/dL — ABNORMAL LOW (ref >=50)
LDL Cholesterol (Calc): 77 mg/dL (calc)
Non-HDL Cholesterol (Calc): 104 mg/dL (calc) (ref <130)
Total CHOL/HDL Ratio: 3.7 (calc) (ref <5.0)
Triglycerides: 171 mg/dL — ABNORMAL HIGH (ref <150)

## 2022-10-29 LAB — CBC WITH DIFFERENTIAL/PLATELET
Absolute Monocytes: 493 cells/uL (ref 200–950)
Basophils Absolute: 44 cells/uL (ref 0–200)
Basophils Relative: 0.5 %
Eosinophils Absolute: 220 cells/uL (ref 15–500)
Eosinophils Relative: 2.5 %
HCT: 34.5 % — ABNORMAL LOW (ref 35.0–45.0)
Hemoglobin: 10.7 g/dL — ABNORMAL LOW (ref 11.7–15.5)
Lymphs Abs: 2050 cells/uL (ref 850–3900)
MCH: 24.8 pg — ABNORMAL LOW (ref 27.0–33.0)
MCHC: 31 g/dL — ABNORMAL LOW (ref 32.0–36.0)
MCV: 79.9 fL — ABNORMAL LOW (ref 80.0–100.0)
MPV: 10.1 fL (ref 7.5–12.5)
Monocytes Relative: 5.6 %
Neutro Abs: 5993 cells/uL (ref 1500–7800)
Neutrophils Relative %: 68.1 %
Platelets: 311 10*3/uL (ref 140–400)
RBC: 4.32 10*6/uL (ref 3.80–5.10)
RDW: 15.9 % — ABNORMAL HIGH (ref 11.0–15.0)
Total Lymphocyte: 23.3 %
WBC: 8.8 10*3/uL (ref 3.8–10.8)

## 2022-10-29 LAB — TSH: TSH: 1.64 mIU/L

## 2022-11-17 ENCOUNTER — Other Ambulatory Visit: Payer: Self-pay | Admitting: Family Medicine

## 2022-11-17 DIAGNOSIS — R7303 Prediabetes: Secondary | ICD-10-CM

## 2022-11-17 DIAGNOSIS — E282 Polycystic ovarian syndrome: Secondary | ICD-10-CM

## 2022-11-17 DIAGNOSIS — E8881 Metabolic syndrome: Secondary | ICD-10-CM

## 2022-11-18 NOTE — Telephone Encounter (Signed)
Requested Prescriptions  Pending Prescriptions Disp Refills   OZEMPIC, 0.25 OR 0.5 MG/DOSE, 2 MG/3ML SOPN [Pharmacy Med Name: OZEMPIC 0.25-0.5 MG/DOSE PEN] 4.5 mL 0    Sig: INJECT 0.5 MG INTO THE SKIN ONCE A WEEK.     Endocrinology:  Diabetes - GLP-1 Receptor Agonists - semaglutide Failed - 11/17/2022  2:04 AM      Failed - HBA1C in normal range and within 180 days    Hgb A1c MFr Bld  Date Value Ref Range Status  10/28/2022 6.3 (H) <5.7 % of total Hgb Final    Comment:    For someone without known diabetes, a hemoglobin  A1c value between 5.7% and 6.4% is consistent with prediabetes and should be confirmed with a  follow-up test. . For someone with known diabetes, a value <7% indicates that their diabetes is well controlled. A1c targets should be individualized based on duration of diabetes, age, comorbid conditions, and other considerations. . This assay result is consistent with an increased risk of diabetes. . Currently, no consensus exists regarding use of hemoglobin A1c for diagnosis of diabetes for children. .          Passed - Cr in normal range and within 360 days    Creat  Date Value Ref Range Status  10/28/2022 0.67 0.50 - 0.99 mg/dL Final         Passed - Valid encounter within last 6 months    Recent Outpatient Visits           3 weeks ago Morbid obesity [facility])   [facility] [place], Kristeen [place], PA-C   7 months ago Abnormal uterine bleeding   [place], FNP   8 months ago Abnormal uterine bleeding   [place], DO   8 months ago Other insomnia   [facility] Rory Percy M, DO   9 months ago Morbid obesity [facility])   [facility] Steele Sizer, MD       Future Appointments             In 2 months Delsa Grana, PA-C [facility], [facility]

## 2023-01-18 ENCOUNTER — Other Ambulatory Visit: Payer: Self-pay | Admitting: Nurse Practitioner

## 2023-01-18 DIAGNOSIS — G4709 Other insomnia: Secondary | ICD-10-CM

## 2023-01-19 NOTE — Telephone Encounter (Signed)
Requested medication (s) are due for refill today: yes  Requested medication (s) are on the active medication list: yes  Last refill:  08/12/22 #30/3  Future visit scheduled: yes  Notes to clinic:  Unable to refill per protocol, medication not assigned to the refill protocol.    Requested Prescriptions  Pending Prescriptions Disp Refills   QUVIVIQ 25 MG TABS [Pharmacy Med Name: QUVIVIQ 25 MG TABLET] 30 tablet 3    Sig: TAKE 25 MG BY MOUTH AT BEDTIME.     Off-Protocol Failed - 01/18/2023  8:57 PM      Failed - Medication not assigned to a protocol, review manually.      Passed - Valid encounter within last 12 months    Recent Outpatient Visits           2 months ago Morbid obesity [facility])   [facility] Delsa Grana, PA-C   9 months ago Abnormal uterine bleeding   [place], FNP   10 months ago Abnormal uterine bleeding   [place], DO   10 months ago Other insomnia   [facility] Rory Percy M, DO   11 months ago Morbid obesity [facility])   [facility] Steele Sizer, MD       Future Appointments             In 1 week Delsa Grana, PA-C [facility], [facility]

## 2023-01-31 ENCOUNTER — Ambulatory Visit: Payer: 59 | Admitting: Family Medicine

## 2023-02-07 ENCOUNTER — Other Ambulatory Visit: Payer: Self-pay | Admitting: Family Medicine

## 2023-02-07 DIAGNOSIS — G2581 Restless legs syndrome: Secondary | ICD-10-CM

## 2023-02-22 ENCOUNTER — Other Ambulatory Visit: Payer: Self-pay | Admitting: Family Medicine

## 2023-02-22 DIAGNOSIS — G2581 Restless legs syndrome: Secondary | ICD-10-CM

## 2023-03-04 ENCOUNTER — Other Ambulatory Visit: Payer: Self-pay | Admitting: Family Medicine

## 2023-03-04 DIAGNOSIS — E8881 Metabolic syndrome: Secondary | ICD-10-CM

## 2023-03-04 DIAGNOSIS — E282 Polycystic ovarian syndrome: Secondary | ICD-10-CM

## 2023-03-04 DIAGNOSIS — R7303 Prediabetes: Secondary | ICD-10-CM

## 2023-03-06 NOTE — Telephone Encounter (Signed)
Lvm for pt to call back and schedule an appt for med refill

## 2023-03-06 NOTE — Telephone Encounter (Signed)
Lvm to sch appt °

## 2023-03-06 NOTE — Telephone Encounter (Signed)
Patient needs appt

## 2023-05-23 ENCOUNTER — Other Ambulatory Visit: Payer: Self-pay | Admitting: Family Medicine

## 2023-05-23 DIAGNOSIS — E282 Polycystic ovarian syndrome: Secondary | ICD-10-CM

## 2023-05-23 DIAGNOSIS — E8881 Metabolic syndrome: Secondary | ICD-10-CM

## 2023-05-23 DIAGNOSIS — G2581 Restless legs syndrome: Secondary | ICD-10-CM

## 2023-05-23 DIAGNOSIS — R7303 Prediabetes: Secondary | ICD-10-CM

## 2023-05-23 DIAGNOSIS — M545 Low back pain, unspecified: Secondary | ICD-10-CM

## 2023-05-24 ENCOUNTER — Other Ambulatory Visit: Payer: Self-pay

## 2023-05-24 DIAGNOSIS — E66813 Obesity, class 3: Secondary | ICD-10-CM

## 2023-05-24 DIAGNOSIS — G8929 Other chronic pain: Secondary | ICD-10-CM

## 2023-05-24 DIAGNOSIS — E282 Polycystic ovarian syndrome: Secondary | ICD-10-CM

## 2023-05-24 DIAGNOSIS — R7303 Prediabetes: Secondary | ICD-10-CM

## 2023-05-24 DIAGNOSIS — M545 Low back pain, unspecified: Secondary | ICD-10-CM

## 2023-05-24 DIAGNOSIS — G2581 Restless legs syndrome: Secondary | ICD-10-CM

## 2023-05-24 DIAGNOSIS — E8881 Metabolic syndrome: Secondary | ICD-10-CM

## 2023-05-28 ENCOUNTER — Other Ambulatory Visit: Payer: Self-pay | Admitting: Family Medicine

## 2023-05-28 DIAGNOSIS — G2581 Restless legs syndrome: Secondary | ICD-10-CM

## 2023-05-30 NOTE — Progress Notes (Unsigned)
Name: Connie Sims   MRN: [id number]    DOB: 01-04-1981   Date:05/31/2023       Progress Note  Subjective  Chief Complaint  Medication Refill  HPI  Metabolic Syndrome: she has a long history of PCO's , A1C elevation, triglycerides elevation and also fatty liver. She tried Metformin but caused GI symptoms, switched to Actos last year but we switched to GLP-1 agonist - Ozempic May 2021  due to associated morbid obesity. She denies polyphagia, polydipsia or polyuria. A1C trending up , she will try to decreased soda intake currently drinking 5 cans a day   Morbid obesity: she has always been overweight ( childhood), but worse since age 46 - she went from a size 12/14  in high school and is up to a size 22 ow, today is her heaviest weight at 277 lbs.  She likes sodas 40 oz a day, she is not very physically active. 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 She must cut down on soda intake   Vitamin D :she must resume taking vitamin D supplements   Menorrhagia: currently bleeding for the past few weeks, heavy cycle, does not want to take ocp's, discussed IUD and ablation, we will refer her back to gyn . Iron deficiency anemia likely from chronic blood loss.   Low back pain: intermittent , she has osteopetrosis , familial, she would like to switch from Flexeril to Tizanidine . She sees chiropractor intermittently   RLS: likely due to iron deficiency anemia, must resume iron intake and stop having cycles   GERD: doing well on PPI, denies blood in stools or abdominal pain   Patient Active Problem List   Diagnosis Date Noted   Right ovarian cyst 06/27/2018   Uterine leiomyoma 06/14/2018   Wheat intolerance 02/15/2018   Dysfunctional uterine bleeding 06/29/2017   Highly echogenic liver on ultrasound 06/15/2017   PLMD (periodic limb movement disorder) 02/01/2016   Microcytosis 02/01/2016   Vitamin D deficiency 02/01/2016   Elevated alanine  aminotransferase (ALT) level 02/01/2016   Hypertriglyceridemia 10/23/2015   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   PCO (polycystic ovaries)    Allergic rhinitis    Class 3 severe obesity with serious comorbidity and body mass index (BMI) of 40.0 to 44.9 in adult (HCC)    Insomnia    Low back pain    Impaired fasting glucose     Past Surgical History:  Procedure Laterality Date   NASAL SINUS SURGERY      Family History  Problem Relation Age of Onset   Liver disease Mother        fatty liver, s/p liver biopsy   Alcohol abuse Mother    Hypertension Maternal Grandmother    Glaucoma Maternal Grandmother    Diabetes Maternal Grandfather    Heart disease Maternal Grandfather    Breast cancer Neg Hx    Ovarian cancer Neg Hx    Colon cancer Neg Hx     Social History   Tobacco Use   Smoking status: Never   Smokeless tobacco: Never  Substance Use Topics   Alcohol use: No    Alcohol/week: 0.0 standard drinks of alcohol     Current Outpatient Medications:    cyclobenzaprine (FLEXERIL) 10 MG tablet, TAKE 1/2 TO 1 TABLET (5-10 MG TOTAL) BY MOUTH EVERY DAY AT BEDTIME, Disp: 30 tablet, Rfl: 5   pantoprazole (PROTONIX) 40 MG tablet, TAKE 1 TABLET BY MOUTH  DAILY BEFORE BREAKFAST, Disp: 90 tablet, Rfl: 3   pramipexole (MIRAPEX) 0.5 MG tablet, TAKE 1 TABLET BY MOUTH EVERYDAY AT BEDTIME, Disp: 30 tablet, Rfl: 0   QUVIVIQ 25 MG TABS, TAKE 25 MG BY MOUTH AT BEDTIME., Disp: 30 tablet, Rfl: 3   Semaglutide,0.25 or 0.5MG /DOS, (OZEMPIC, 0.25 OR 0.5 MG/DOSE,) 2 MG/3ML SOPN, INJECT 0.5 MG INTO THE SKIN ONCE A WEEK., Disp: 3 mL, Rfl: 0  No Known Allergies  I personally reviewed active problem list, medication list, allergies, family history, social history, health maintenance with the patient/caregiver today.   ROS  Constitutional: Negative for fever, positive for  weight change.  Respiratory: Negative for cough and shortness of breath.   Cardiovascular: Negative for chest pain or palpitations.   Gastrointestinal: Negative for abdominal pain, no bowel changes.  Musculoskeletal: Negative for gait problem or joint swelling.  Skin: Negative for rash.  Neurological: Negative for dizziness or headache.  No other specific complaints in a complete review of systems (except as listed in HPI above).   Objective  Vitals:   05/31/23 0904  BP: 126/74  Pulse: 95  Resp: 16  Temp: (!) 97.5 F (36.4 C)  TempSrc: Oral  SpO2: 97%  Weight: 277 lb 8 oz (125.9 kg)  Height: 5\' 5"  (1.651 m)    Body mass index is 46.18 kg/m.  Physical Exam  Constitutional: Patient appears well-developed and well-nourished. Obese  No distress.  HEENT: head atraumatic, normocephalic, pupils equal and reactive to light,, neck supple Cardiovascular: Normal rate, regular rhythm and normal heart sounds.  No murmur heard. No BLE edema. Pulmonary/Chest: Effort normal and breath sounds normal. No respiratory distress. Abdominal: Soft.  There is no tenderness. Psychiatric: Patient has a normal mood and affect. behavior is normal. Judgment and thought content normal.   PHQ2/9:    05/31/2023    9:12 AM 10/28/2022    8:42 AM 04/04/2022    9:41 AM 03/07/2022   10:23 AM 02/28/2022   10:01 AM  Depression screen PHQ 2/9  Decreased Interest 0 0 0 0 0  Down, Depressed, Hopeless 0 0 0 0 0  PHQ - 2 Score 0 0 0 0 0  Altered sleeping 0 0   0  Tired, decreased energy 0 0   0  Change in appetite 0 0   0  Feeling bad or failure about yourself  0 0   0  Trouble concentrating 0 0   0  Moving slowly or fidgety/restless 0 0   0  Suicidal thoughts 0 0   0  PHQ-9 Score 0 0   0  Difficult doing work/chores  Not difficult at all   Not difficult at all    phq 9 is negative   Fall Risk:    05/31/2023    9:11 AM 10/28/2022    8:42 AM 04/04/2022    9:41 AM 03/07/2022   10:23 AM 02/28/2022   10:00 AM  Fall Risk   Falls in the past year? 0 0 0 0 0  Number falls in past yr:  0 0 0 0  Injury with Fall?  0 0  0  Risk for fall due to  : No Fall Risks No Fall Risks   No Fall Risks  Follow up Falls prevention discussed Falls prevention discussed;Education provided;Falls evaluation completed Falls evaluation completed Falls evaluation completed Falls prevention discussed      Functional Status Survey: Is the patient deaf or have difficulty hearing?: No Does the patient have difficulty seeing,  even when wearing glasses/contacts?: No Does the patient have difficulty concentrating, remembering, or making decisions?: No Does the patient have difficulty walking or climbing stairs?: No Does the patient have difficulty dressing or bathing?: No Does the patient have difficulty doing errands alone such as visiting a doctor's office or shopping?: No    Assessment & Plan  1. Morbid obesity (HCC)  She will check with insurance coverage of weight loss medications   - Semaglutide,0.25 or 0.5MG /DOS, (OZEMPIC, 0.25 OR 0.5 MG/DOSE,) 2 MG/3ML SOPN; Inject 0.5 mg into the skin once a week.  Dispense: 9 mL; Refill: 0  2. Iron deficiency anemia due to chronic blood loss  - Ambulatory referral to Obstetrics / Gynecology - CBC with Differential/Platelet - Iron, TIBC and Ferritin Panel  3. Menorrhagia, premenopausal  - Ambulatory referral to Obstetrics / Gynecology  4. Metabolic syndrome  - Hemoglobin A1c - Semaglutide,0.25 or 0.5MG /DOS, (OZEMPIC, 0.25 OR 0.5 MG/DOSE,) 2 MG/3ML SOPN; Inject 0.5 mg into the skin once a week.  Dispense: 9 mL; Refill: 0  5. Gastroesophageal reflux disease without esophagitis  - pantoprazole (PROTONIX) 40 MG tablet; Take 1 tablet (40 mg total) by mouth daily before breakfast.  Dispense: 90 tablet; Refill: 1  6. PCO (polycystic ovaries)   7. Other insomnia  - Daridorexant HCl (QUVIVIQ) 25 MG TABS; Take 1 tablet (25 mg total) by mouth at bedtime.  Dispense: 30 tablet; Refill: 3

## 2023-05-31 ENCOUNTER — Ambulatory Visit (INDEPENDENT_AMBULATORY_CARE_PROVIDER_SITE_OTHER): Payer: 59 | Admitting: Family Medicine

## 2023-05-31 ENCOUNTER — Encounter: Payer: Self-pay | Admitting: Family Medicine

## 2023-05-31 DIAGNOSIS — D500 Iron deficiency anemia secondary to blood loss (chronic): Secondary | ICD-10-CM | POA: Insufficient documentation

## 2023-05-31 DIAGNOSIS — N924 Excessive bleeding in the premenopausal period: Secondary | ICD-10-CM | POA: Diagnosis not present

## 2023-05-31 DIAGNOSIS — G4709 Other insomnia: Secondary | ICD-10-CM

## 2023-05-31 DIAGNOSIS — E8881 Metabolic syndrome: Secondary | ICD-10-CM | POA: Diagnosis not present

## 2023-05-31 DIAGNOSIS — E282 Polycystic ovarian syndrome: Secondary | ICD-10-CM

## 2023-05-31 DIAGNOSIS — K219 Gastro-esophageal reflux disease without esophagitis: Secondary | ICD-10-CM

## 2023-05-31 DIAGNOSIS — M545 Low back pain, unspecified: Secondary | ICD-10-CM

## 2023-05-31 DIAGNOSIS — G8929 Other chronic pain: Secondary | ICD-10-CM

## 2023-05-31 LAB — CBC WITH DIFFERENTIAL/PLATELET
Absolute Monocytes: 461 cells/uL (ref 200–950)
Hemoglobin: 10.2 g/dL — ABNORMAL LOW (ref 11.7–15.5)
MCHC: 31.5 g/dL — ABNORMAL LOW (ref 32.0–36.0)
Monocytes Relative: 5.3 %
Neutro Abs: 5933 cells/uL (ref 1500–7800)
RDW: 17.4 % — ABNORMAL HIGH (ref 11.0–15.0)

## 2023-05-31 MED ORDER — OZEMPIC (0.25 OR 0.5 MG/DOSE) 2 MG/3ML ~~LOC~~ SOPN: 0.5000 mg | PEN_INJECTOR | SUBCUTANEOUS | 0 refills | Status: DC

## 2023-05-31 MED ORDER — TIZANIDINE HCL 4 MG PO TABS
2.0000 mg | ORAL_TABLET | Freq: Every day | ORAL | 1 refills | Status: DC
Start: 2023-05-31 — End: 2023-10-10

## 2023-05-31 MED ORDER — IRON (FERROUS SULFATE) 325 (65 FE) MG PO TABS
325.0000 mg | ORAL_TABLET | Freq: Three times a day (TID) | ORAL | 1 refills | Status: DC
Start: 2023-05-31 — End: 2023-12-25

## 2023-05-31 MED ORDER — PANTOPRAZOLE SODIUM 40 MG PO TBEC: 40.0000 mg | DELAYED_RELEASE_TABLET | Freq: Every day | ORAL | 1 refills | Status: DC

## 2023-05-31 MED ORDER — QUVIVIQ 25 MG PO TABS: 25.0000 mg | ORAL_TABLET | Freq: Every day | ORAL | 3 refills | Status: DC

## 2023-05-31 NOTE — Patient Instructions (Signed)
Call insurance and ask if they cover Wegovy or Zepbound for weight loss

## 2023-06-01 LAB — CBC WITH DIFFERENTIAL/PLATELET
Basophils Absolute: 52 cells/uL (ref 0–200)
Basophils Relative: 0.6 %
Eosinophils Absolute: 209 cells/uL (ref 15–500)
Eosinophils Relative: 2.4 %
HCT: 32.4 % — ABNORMAL LOW (ref 35.0–45.0)
Lymphs Abs: 2045 cells/uL (ref 850–3900)
MCH: 23.9 pg — ABNORMAL LOW (ref 27.0–33.0)
MCV: 75.9 fL — ABNORMAL LOW (ref 80.0–100.0)
MPV: 9.7 fL (ref 7.5–12.5)
Neutrophils Relative %: 68.2 %
Platelets: 284 10*3/uL (ref 140–400)
RBC: 4.27 10*6/uL (ref 3.80–5.10)
Total Lymphocyte: 23.5 %
WBC: 8.7 10*3/uL (ref 3.8–10.8)

## 2023-06-01 LAB — HEMOGLOBIN A1C
Hgb A1c MFr Bld: 6.4 % of total Hgb — ABNORMAL HIGH (ref <5.7)
Mean Plasma Glucose: 137 mg/dL
eAG (mmol/L): 7.6 mmol/L

## 2023-06-01 LAB — IRON,TIBC AND FERRITIN PANEL
%SAT: 8 % (calc) — ABNORMAL LOW (ref 16–45)
Ferritin: 16 ng/mL (ref 16–232)
Iron: 39 ug/dL — ABNORMAL LOW (ref 40–190)
TIBC: 480 mcg/dL (calc) — ABNORMAL HIGH (ref 250–450)

## 2023-06-02 ENCOUNTER — Encounter: Payer: Self-pay | Admitting: Family Medicine

## 2023-06-05 ENCOUNTER — Other Ambulatory Visit: Payer: Self-pay | Admitting: Family Medicine

## 2023-06-05 MED ORDER — ZEPBOUND 2.5 MG/0.5ML ~~LOC~~ SOAJ
2.5000 mg | SUBCUTANEOUS | 0 refills | Status: DC
Start: 2023-06-05 — End: 2023-07-05

## 2023-06-05 NOTE — Telephone Encounter (Signed)
PA initiated 05/31/23- still waiting on determination.

## 2023-06-30 ENCOUNTER — Telehealth: Payer: Self-pay | Admitting: Family Medicine

## 2023-06-30 NOTE — Telephone Encounter (Signed)
COVER MY MEDS  Approved on June 24 Request Reference Number: [id number]. ZEPBOUND INJ 2.5MG  is approved through 12/19/2023. Your patient may now fill this prescription and it will be covered.  Called pt no answer left detailed vm

## 2023-06-30 NOTE — Telephone Encounter (Signed)
Pt is calling to report that the zepbound is needing Prior Authorization. Please advise CB- [phone]

## 2023-07-03 ENCOUNTER — Other Ambulatory Visit: Payer: Self-pay | Admitting: Family Medicine

## 2023-07-03 NOTE — Telephone Encounter (Signed)
Medication Refill - Medication: tirzepatide (ZEPBOUND) 2.5 MG/0.5ML Pen   Patient called & requested her medication of Zepbound be sent to [facility] and not local pharmacy. Per patient, she stated it would say [facility] and I have listed this under preferred pharmacies, this will be first time receiving this medication.  Has the patient contacted their pharmacy? Yes.    Preferred Pharmacy (with phone number or street name): [facility] - [place], [place] - [address], [address] [phone]   Has the patient been seen for an appointment in the last year OR does the patient have an upcoming appointment? Yes.    Patients call back #[phone]

## 2023-07-03 NOTE — Telephone Encounter (Signed)
Requested medication (s) are due for refill today: Yes  Requested medication (s) are on the active medication list: Yes  Last refill:  06/05/23  Future visit scheduled: No  Notes to clinic:  Unable to refill due to no refill protocol for this medication. Resend to mail order pharmacy.     Requested Prescriptions  Pending Prescriptions Disp Refills   tirzepatide (ZEPBOUND) 2.5 MG/0.5ML Pen 2 mL 0    Sig: Inject 2.5 mg into the skin once a week.     Off-Protocol Failed - 07/03/2023 10:34 AM      Failed - Medication not assigned to a protocol, review manually.      Passed - Valid encounter within last 12 months    Recent Outpatient Visits           1 month ago Morbid obesity [facility])   [place] [facility] Alba Cory, MD   8 months ago Morbid obesity [facility])   [facility] Danelle Berry, PA-C   1 year ago Abnormal uterine bleeding   [facility] Berniece Salines, FNP   1 year ago Abnormal uterine bleeding   [facility] Caro Laroche, DO   1 year ago Other insomnia   [facility] Caro Laroche, [place]

## 2023-07-05 ENCOUNTER — Other Ambulatory Visit: Payer: Self-pay

## 2023-07-05 MED ORDER — ZEPBOUND 2.5 MG/0.5ML ~~LOC~~ SOAJ
2.5000 mg | SUBCUTANEOUS | 0 refills | Status: DC
Start: 2023-07-05 — End: 2023-08-04

## 2023-07-05 NOTE — Progress Notes (Signed)
Pt preference to different pharmacy

## 2023-07-05 NOTE — Telephone Encounter (Signed)
No answer from pt left detailed vm. °

## 2023-07-06 NOTE — Telephone Encounter (Signed)
Pt needed current med sent to different pharmacy. It has been sent

## 2023-07-22 ENCOUNTER — Ambulatory Visit
Admission: EM | Admit: 2023-07-22 | Discharge: 2023-07-22 | Disposition: A | Discharge status: Home / Self Care | Payer: 59 | Attending: Emergency Medicine | Admitting: Emergency Medicine

## 2023-07-22 ENCOUNTER — Encounter: Payer: Self-pay | Admitting: Emergency Medicine

## 2023-07-22 DIAGNOSIS — S61216A Laceration without foreign body of right little finger without damage to nail, initial encounter: Secondary | ICD-10-CM

## 2023-07-22 MED ORDER — TETANUS-DIPHTH-ACELL PERTUSSIS 5-2.5-18.5 LF-MCG/0.5 IM SUSY
0.5000 mL | PREFILLED_SYRINGE | Freq: Once | INTRAMUSCULAR | Status: AC
  Administered 2023-07-22: 0.5 mL via INTRAMUSCULAR

## 2023-07-22 MED ORDER — CEPHALEXIN 500 MG PO CAPS: 500.0000 mg | ORAL_CAPSULE | Freq: Three times a day (TID) | ORAL | 0 refills | Status: AC

## 2023-07-22 NOTE — Discharge Instructions (Addendum)
Keep your wound clean and dry.  Apply a dry, nonstick dressing when you are at work or outside of the home.  You may leave it open to the air when at home.  Take the Keflex 500 mg 3 times a day with food for 5 days to prevent infection.  We have updated your tetanus shot today.  If you develop any increased redness to fingertip, swelling, pus drainage, red streaks going up your finger, or fever you need to return for reevaluation.

## 2023-07-22 NOTE — ED Provider Notes (Signed)
[facility]    CSN: [id number] Arrival date & time: 07/22/23  1146      History   Chief Complaint Chief Complaint  Patient presents with   Laceration    Right pinky    HPI Connie Sims is a 42 y.o. female.   HPI  42 year old female with a past medical history significant for GERD, uterine leiomyoma, dysfunctional uterine bleeding, hypertriglyceridemia, and polycystic ovarian syndrome presents for evaluation of laceration to the tip of her right fifth finger.  Happened approximately 45 minutes prior to arrival.  She was using a mandolin and sliced end of her finger.  She was unable to get stop bleeding so she came in for evaluation.  She is unsure when her last tetanus shot was but she thinks it was within the last 10 years.  Past Medical History:  Diagnosis Date   Allergic rhinitis    Allergic rhinitis with postnasal drip    Congenital osteopetrosis    previously managed by specialist at [facility], periodic hearing tests   Diabetes mellitus without complication (HCC)    Epigastric pain    Foot fracture, right    Jones fx 5th metatarsal   Impaired fasting glucose    Insomnia    Low back pain    LUQ pain    Microcytosis    Morbid obesity with BMI of 40.0-44.9, adult (HCC)    PCOS (polycystic ovarian syndrome)    Trochanteric bursitis of right hip     Patient Active Problem List   Diagnosis Date Noted   Gastroesophageal reflux disease without esophagitis 05/31/2023   Metabolic syndrome 05/31/2023   Menorrhagia, premenopausal 05/31/2023   Iron deficiency anemia due to chronic blood loss 05/31/2023   Right ovarian cyst 06/27/2018   Uterine leiomyoma 06/14/2018   Wheat intolerance 02/15/2018   Dysfunctional uterine bleeding 06/29/2017   Highly echogenic liver on ultrasound 06/15/2017   PLMD (periodic limb movement disorder) 02/01/2016   Microcytosis 02/01/2016   Vitamin D deficiency 02/01/2016   Elevated alanine aminotransferase (ALT) level 02/01/2016    Hypertriglyceridemia 10/23/2015   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   PCO (polycystic ovaries)    Allergic rhinitis    Class 3 severe obesity with serious comorbidity and body mass index (BMI) of 40.0 to 44.9 in adult (HCC)    Insomnia    Low back pain    Impaired fasting glucose     Past Surgical History:  Procedure Laterality Date   NASAL SINUS SURGERY      OB History     Gravida  0   Para      Term      Preterm      AB      Living         SAB      IAB      Ectopic      Multiple      Live Births           Obstetric Comments  1st Menstrual Cycle:  11            Home Medications    Prior to Admission medications   Medication Sig Start Date End Date Taking? Authorizing Provider  cephALEXin (KEFLEX) 500 MG capsule Take 1 capsule (500 mg total) by mouth 3 (three) times daily for 5 days. 07/22/23 07/27/23 Yes Becky Augusta, NP  Daridorexant HCl (QUVIVIQ) 25 MG TABS Take 1 tablet (25 mg total) by mouth at bedtime. 05/31/23  Yes Alba Cory, MD  Iron,  Ferrous Sulfate, 325 (65 Fe) MG TABS Take 325 mg by mouth 3 (three) times daily. 05/31/23  Yes Sowles, Danna Hefty, MD  pantoprazole (PROTONIX) 40 MG tablet Take 1 tablet (40 mg total) by mouth daily before breakfast. 05/31/23  Yes Sowles, Danna Hefty, MD  tirzepatide (ZEPBOUND) 2.5 MG/0.5ML Pen Inject 2.5 mg into the skin once a week. 07/05/23  Yes Sowles, Danna Hefty, MD  tiZANidine (ZANAFLEX) 4 MG tablet Take 0.5-1 tablets (2-4 mg total) by mouth at bedtime. 05/31/23  Yes Alba Cory, MD    Family History Family History  Problem Relation Age of Onset   Liver disease Mother        fatty liver, s/p liver biopsy   Alcohol abuse Mother    Hypertension Maternal Grandmother    Glaucoma Maternal Grandmother    Diabetes Maternal Grandfather    Heart disease Maternal Grandfather    Breast cancer Neg Hx    Ovarian cancer Neg Hx    Colon cancer Neg Hx     Social History Social History   Tobacco Use   Smoking status: Never    Smokeless tobacco: Never  Vaping Use   Vaping status: Never Used  Substance Use Topics   Alcohol use: No    Alcohol/week: 0.0 standard drinks of alcohol   Drug use: No     Allergies   Patient has no known allergies.   Review of Systems Review of Systems  Skin:  Positive for wound. Negative for color change.     Physical Exam Triage Vital Signs ED Triage Vitals  Encounter Vitals Group     BP --      Systolic BP Percentile --      Diastolic BP Percentile --      Pulse --      Resp --      Temp --      Temp src --      SpO2 --      Weight 07/22/23 1213 277 lb 9 oz (125.9 kg)     Height 07/22/23 1213 5\' 5"  (1.651 m)     Head Circumference --      Peak Flow --      Pain Score 07/22/23 1212 4     Pain Loc --      Pain Education --      Exclude from Growth Chart --    No data found.  Updated Vital Signs BP (!) 119/53 (BP Location: Left Arm)   Pulse 89   Temp 98.3 F (36.8 C) (Oral)   Resp 16   Ht 5\' 5"  (1.651 m)   Wt 277 lb 9 oz (125.9 kg)   LMP 07/01/2023 (Approximate)   SpO2 98%   BMI 46.19 kg/m   Visual Acuity Right Eye Distance:   Left Eye Distance:   Bilateral Distance:    Right Eye Near:   Left Eye Near:    Bilateral Near:     Physical Exam Vitals and nursing note reviewed.  Constitutional:      Appearance: Normal appearance. She is not ill-appearing.  HENT:     Head: Normocephalic and atraumatic.  Musculoskeletal:        General: Signs of injury present.  Skin:    General: Skin is warm and dry.     Capillary Refill: Capillary refill takes less than 2 seconds.  Neurological:     General: No focal deficit present.     Mental Status: She is alert and oriented to person, place, and time.  UC Treatments / Results  Labs (all labs ordered are listed, but only abnormal results are displayed) Labs Reviewed - No data to display  EKG   Radiology No results found.  Procedures Procedures (including critical care  time)  Medications Ordered in UC Medications  Tdap (BOOSTRIX) injection 0.5 mL (has no administration in time range)    Initial Impression / Assessment and Plan / UC Course  I have reviewed the triage vital signs and the nursing notes.  Pertinent labs & imaging results that were available during my care of the patient were reviewed by me and considered in my medical decision making (see chart for details).   Patient is a pleasant, nontoxic-appearing 42 year old female presenting for evaluation of laceration to the tip of her right fifth finger.  Patient seen image above, there is a small area to the medial tip of the right fifth finger that is missing with 2 small areas of venous oozing.  This area was dab dry with gauze and then cauterized using silver nitrate.  Patient tolerated procedure well.  Given that she is unsure when her last tetanus shot was, and we cannot find documentation in epic, I will order an updated Tdap.  I will have the staff clean the wound and apply a dry, nonstick dressing.  I will discharge patient home on Keflex 500 mg 3 times a day for 5 days to prevent infection.  Return precautions reviewed.  She denies need for work note.  Final Clinical Impressions(s) / UC Diagnoses   Final diagnoses:  Laceration of right little finger without foreign body without damage to nail, initial encounter     Discharge Instructions      Keep your wound clean and dry.  Apply a dry, nonstick dressing when you are at work or outside of the home.  You may leave it open to the air when at home.  Take the Keflex 500 mg 3 times a day with food for 5 days to prevent infection.  We have updated your tetanus shot today.  If you develop any increased redness to fingertip, swelling, pus drainage, red streaks going up your finger, or fever you need to return for reevaluation.     ED Prescriptions     Medication Sig Dispense Auth. Provider   cephALEXin (KEFLEX) 500 MG capsule Take 1  capsule (500 mg total) by mouth 3 (three) times daily for 5 days. 15 capsule Becky Augusta, NP      PDMP not reviewed this encounter.   Becky Augusta, NP 07/22/23 1229

## 2023-07-22 NOTE — ED Triage Notes (Signed)
Pt state she was using a mandolin and sliced her right pinky. Accident occurred about 45 minutes ago. Tetanus UTD per pt.

## 2023-08-01 ENCOUNTER — Other Ambulatory Visit: Payer: Self-pay | Admitting: Family Medicine

## 2023-08-01 ENCOUNTER — Ambulatory Visit
Admission: RE | Admit: 2023-08-01 | Discharge: 2023-08-01 | Disposition: A | Discharge status: Home / Self Care | Payer: 59 | Source: Ambulatory Visit | Attending: Emergency Medicine | Admitting: Emergency Medicine

## 2023-08-01 VITALS — BP 119/78 | HR 63 | Temp 97.9°F | Resp 16

## 2023-08-01 DIAGNOSIS — Z5189 Encounter for other specified aftercare: Secondary | ICD-10-CM | POA: Diagnosis not present

## 2023-08-01 DIAGNOSIS — T148XXA Other injury of unspecified body region, initial encounter: Secondary | ICD-10-CM | POA: Diagnosis not present

## 2023-08-01 DIAGNOSIS — L089 Local infection of the skin and subcutaneous tissue, unspecified: Secondary | ICD-10-CM

## 2023-08-01 MED ORDER — SULFAMETHOXAZOLE-TRIMETHOPRIM 800-160 MG PO TABS: 1.0000 | ORAL_TABLET | Freq: Two times a day (BID) | ORAL | 0 refills | Status: AC

## 2023-08-01 NOTE — Discharge Instructions (Signed)
Soak your finger in warm water and Epsom salts twice daily to help draw out the infection.  Take the Bactrim twice daily with a full glass of water for 7 days to treat the infection.  Keep a dressing on your finger until the drainage stops and when at work or in public.  If the redness increases, swelling increases, red streaks develop, or you start to run a fever you need to be evaluated in the ER.

## 2023-08-01 NOTE — ED Triage Notes (Signed)
Pt was seen 7/27 for a laceration to her right pinky. Pt states her finger is swollen and oozing pus.

## 2023-08-01 NOTE — ED Provider Notes (Signed)
[facility]    CSN: [id number] Arrival date & time: 08/01/23  1106      History   Chief Complaint Chief Complaint  Patient presents with   Wound Check    open wound on 8/27. took antibiotics and kept covered. bumped cauterized area yesterday which then cracked and started oozing blood and puss. - Entered by patient    HPI Connie Sims is a 42 y.o. female.   HPI  42 year old female with a past medical history significant for polycystic ovarian syndrome, vitamin D deficiency, and hypertriglyceridemia presents for wound check to her right fifth finger.  She was seen at this urgent care on 727 after sustaining a laceration to the tip of her pinky while using a mandolin.  There is nothing to suture and there was some scant venous oozing that was addressed with silver nitrate cautery.  She was discharged home on Keflex 500 mg 3 times a day to prevent infection.  She reports that yesterday she noticed that her finger was little more red and swollen and she accidentally struck the tip of her finger on an object which caused it to split open and pus to discharge.  She has not had fever.  Past Medical History:  Diagnosis Date   Allergic rhinitis    Allergic rhinitis with postnasal drip    Congenital osteopetrosis    previously managed by specialist at [facility], periodic hearing tests   Diabetes mellitus without complication (HCC)    Epigastric pain    Foot fracture, right    Jones fx 5th metatarsal   Impaired fasting glucose    Insomnia    Low back pain    LUQ pain    Microcytosis    Morbid obesity with BMI of 40.0-44.9, adult (HCC)    PCOS (polycystic ovarian syndrome)    Trochanteric bursitis of right hip     Patient Active Problem List   Diagnosis Date Noted   Gastroesophageal reflux disease without esophagitis 05/31/2023   Metabolic syndrome 05/31/2023   Menorrhagia, premenopausal 05/31/2023   Iron deficiency anemia due to chronic blood loss 05/31/2023   Right  ovarian cyst 06/27/2018   Uterine leiomyoma 06/14/2018   Wheat intolerance 02/15/2018   Dysfunctional uterine bleeding 06/29/2017   Highly echogenic liver on ultrasound 06/15/2017   PLMD (periodic limb movement disorder) 02/01/2016   Microcytosis 02/01/2016   Vitamin D deficiency 02/01/2016   Elevated alanine aminotransferase (ALT) level 02/01/2016   Hypertriglyceridemia 10/23/2015   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   PCO (polycystic ovaries)    Allergic rhinitis    Class 3 severe obesity with serious comorbidity and body mass index (BMI) of 40.0 to 44.9 in adult (HCC)    Insomnia    Low back pain    Impaired fasting glucose     Past Surgical History:  Procedure Laterality Date   NASAL SINUS SURGERY      OB History     Gravida  0   Para      Term      Preterm      AB      Living         SAB      IAB      Ectopic      Multiple      Live Births           Obstetric Comments  1st Menstrual Cycle:  11            Home Medications  Prior to Admission medications   Medication Sig Start Date End Date Taking? Authorizing Provider  sulfamethoxazole-trimethoprim (BACTRIM DS) 800-160 MG tablet Take 1 tablet by mouth 2 (two) times daily for 7 days. 08/01/23 08/08/23 Yes Becky Augusta, NP  Daridorexant HCl (QUVIVIQ) 25 MG TABS Take 1 tablet (25 mg total) by mouth at bedtime. 05/31/23   Alba Cory, MD  Iron, Ferrous Sulfate, 325 (65 Fe) MG TABS Take 325 mg by mouth 3 (three) times daily. 05/31/23   Alba Cory, MD  pantoprazole (PROTONIX) 40 MG tablet Take 1 tablet (40 mg total) by mouth daily before breakfast. 05/31/23   Carlynn Purl, Danna Hefty, MD  tirzepatide  [facility]) 2.5 MG/0.5ML Pen Inject 2.5 mg into the skin once a week. 07/05/23   Alba Cory, MD  tiZANidine (ZANAFLEX) 4 MG tablet Take 0.5-1 tablets (2-4 mg total) by mouth at bedtime. 05/31/23   Alba Cory, MD    Family History Family History  Problem Relation Age of Onset   Liver disease Mother         fatty liver, s/p liver biopsy   Alcohol abuse Mother    Hypertension Maternal Grandmother    Glaucoma Maternal Grandmother    Diabetes Maternal Grandfather    Heart disease Maternal Grandfather    Breast cancer Neg Hx    Ovarian cancer Neg Hx    Colon cancer Neg Hx     Social History Social History   Tobacco Use   Smoking status: Never   Smokeless tobacco: Never  Vaping Use   Vaping status: Never Used  Substance Use Topics   Alcohol use: No    Alcohol/week: 0.0 standard drinks of alcohol   Drug use: No     Allergies   Patient has no known allergies.   Review of Systems Review of Systems  Constitutional:  Negative for fever.  Skin:  Positive for color change and wound.     Physical Exam Triage Vital Signs ED Triage Vitals  Encounter Vitals Group     BP      Systolic BP Percentile      Diastolic BP Percentile      Pulse      Resp      Temp      Temp src      SpO2      Weight      Height      Head Circumference      Peak Flow      Pain Score      Pain Loc      Pain Education      Exclude from Growth Chart    No data found.  Updated Vital Signs BP 119/78 (BP Location: Left Wrist)   Pulse 63   Temp 97.9 F (36.6 C) (Oral)   Resp 16   LMP 07/01/2023 (Approximate)   SpO2 99%   Visual Acuity Right Eye Distance:   Left Eye Distance:   Bilateral Distance:    Right Eye Near:   Left Eye Near:    Bilateral Near:     Physical Exam Vitals and nursing note reviewed.  Constitutional:      Appearance: Normal appearance. She is not ill-appearing.  HENT:     Head: Normocephalic and atraumatic.  Skin:    General: Skin is warm and dry.     Capillary Refill: Capillary refill takes less than 2 seconds.     Findings: Erythema present.  Neurological:     General: No focal deficit present.  Mental Status: She is alert and oriented to person, place, and time.      UC Treatments / Results  Labs (all labs ordered are listed, but only abnormal  results are displayed) Labs Reviewed - No data to display  EKG   Radiology No results found.  Procedures Procedures (including critical care time)  Medications Ordered in UC Medications - No data to display  Initial Impression / Assessment and Plan / UC Course  I have reviewed the triage vital signs and the nursing notes.  Pertinent labs & imaging results that were available during my care of the patient were reviewed by me and considered in my medical decision making (see chart for details).   Patient is a pleasant, nontoxic-appearing 52 old female presenting for evaluation of a wound check to her right fifth finger as outlined in HPI above.  As you can see in image above, there is erythema to the distal aspect of the distal phalanx of the right fifth finger.  There is no induration or fluctuance.  There is a scant amount of tan pus present at the tip where she struck it against an object and broke it open.  I will have her soak her finger in warm water and Epsom salts twice daily to help dry out any infection and I will start her on Bactrim which would give broader coverage twice daily for 7 days.  She should keep the area clean and dry and keep a dressing in place until the drainage has stopped and whenever in public or at work.  If she has any progression of the redness, increased swelling, red streaks going up her finger, or fever she needs to go to the ER for evaluation.  Patient verbalizes understanding of same.  She denies need for work note.  Final Clinical Impressions(s) / UC Diagnoses   Final diagnoses:  Visit for wound check  Wound infection     Discharge Instructions      Soak your finger in warm water and Epsom salts twice daily to help draw out the infection.  Take the Bactrim twice daily with a full glass of water for 7 days to treat the infection.  Keep a dressing on your finger until the drainage stops and when at work or in public.  If the redness  increases, swelling increases, red streaks develop, or you start to run a fever you need to be evaluated in the ER.     ED Prescriptions     Medication Sig Dispense Auth. Provider   sulfamethoxazole-trimethoprim (BACTRIM DS) 800-160 MG tablet Take 1 tablet by mouth 2 (two) times daily for 7 days. 14 tablet Becky Augusta, NP      PDMP not reviewed this encounter.   Becky Augusta, NP 08/01/23 1131

## 2023-08-01 NOTE — Telephone Encounter (Signed)
Left voicemail and sent MyChart message. Awaiting patient response.

## 2023-08-09 ENCOUNTER — Other Ambulatory Visit: Payer: Self-pay | Admitting: Family Medicine

## 2023-08-09 MED ORDER — ZEPBOUND 2.5 MG/0.5ML ~~LOC~~ SOAJ
2.5000 mg | SUBCUTANEOUS | 0 refills | Status: DC
Start: 2023-08-09 — End: 2023-09-13

## 2023-08-28 ENCOUNTER — Other Ambulatory Visit: Payer: Self-pay | Admitting: Family Medicine

## 2023-09-11 ENCOUNTER — Encounter: Payer: Self-pay | Admitting: Family Medicine

## 2023-09-13 ENCOUNTER — Other Ambulatory Visit: Payer: Self-pay | Admitting: Family Medicine

## 2023-09-13 MED ORDER — TIRZEPATIDE-WEIGHT MANAGEMENT 5 MG/0.5ML ~~LOC~~ SOLN: 5.0000 mg | SUBCUTANEOUS | 0 refills | Status: DC

## 2023-10-04 ENCOUNTER — Other Ambulatory Visit: Payer: Self-pay | Admitting: Family Medicine

## 2023-10-04 MED ORDER — TIRZEPATIDE-WEIGHT MANAGEMENT 5 MG/0.5ML ~~LOC~~ SOLN: 5.0000 mg | SUBCUTANEOUS | 0 refills | Status: DC

## 2023-10-09 ENCOUNTER — Telehealth: Payer: 59 | Admitting: Family Medicine

## 2023-10-10 ENCOUNTER — Telehealth (INDEPENDENT_AMBULATORY_CARE_PROVIDER_SITE_OTHER): Payer: 59 | Admitting: Physician Assistant

## 2023-10-10 ENCOUNTER — Encounter: Payer: Self-pay | Admitting: Physician Assistant

## 2023-10-10 VITALS — Wt 277.0 lb

## 2023-10-10 DIAGNOSIS — G4709 Other insomnia: Secondary | ICD-10-CM

## 2023-10-10 DIAGNOSIS — G4700 Insomnia, unspecified: Secondary | ICD-10-CM

## 2023-10-10 DIAGNOSIS — G8929 Other chronic pain: Secondary | ICD-10-CM | POA: Diagnosis not present

## 2023-10-10 DIAGNOSIS — E66813 Obesity, class 3: Secondary | ICD-10-CM

## 2023-10-10 DIAGNOSIS — G4761 Periodic limb movement disorder: Secondary | ICD-10-CM | POA: Diagnosis not present

## 2023-10-10 DIAGNOSIS — Z6841 Body Mass Index (BMI) 40.0 and over, adult: Secondary | ICD-10-CM

## 2023-10-10 DIAGNOSIS — M545 Low back pain, unspecified: Secondary | ICD-10-CM | POA: Diagnosis not present

## 2023-10-10 MED ORDER — TIRZEPATIDE-WEIGHT MANAGEMENT 2.5 MG/0.5ML ~~LOC~~ SOLN: 2.5000 mg | SUBCUTANEOUS | 1 refills | Status: DC

## 2023-10-10 MED ORDER — PRAMIPEXOLE DIHYDROCHLORIDE 0.125 MG PO TABS: 0.1250 mg | ORAL_TABLET | Freq: Every day | ORAL | 0 refills | Status: DC

## 2023-10-10 MED ORDER — QUVIVIQ 25 MG PO TABS
25.0000 mg | ORAL_TABLET | Freq: Every day | ORAL | 3 refills | Status: DC
Start: 2023-10-10 — End: 2024-04-26

## 2023-10-10 MED ORDER — CYCLOBENZAPRINE HCL 5 MG PO TABS: 5.0000 mg | ORAL_TABLET | Freq: Three times a day (TID) | ORAL | 0 refills | Status: DC | PRN

## 2023-10-10 MED ORDER — TIRZEPATIDE-WEIGHT MANAGEMENT 5 MG/0.5ML ~~LOC~~ SOLN: 5.0000 mg | SUBCUTANEOUS | 1 refills | Status: DC

## 2023-10-10 NOTE — Progress Notes (Unsigned)
Virtual Visit via Video Note  I connected with Connie Sims on 10/12/23 at  3:00 PM EDT by a video enabled telemedicine application and verified that I am speaking with the correct person using two identifiers.  Today's Provider: Jacquelin Hawking, MHS, PA-C Introduced myself to the patient as a PA-C and provided education on APPs in clinical practice.   Location: Patient: at home  Provider: [facility], [place], [place]    I discussed the limitations of evaluation and management by telemedicine and the availability of in person appointments. The patient expressed understanding and agreed to proceed.   Chief Complaint  Patient presents with   restless leg syndrome   Back Pain    Chronic with spasms    History of Present Illness:   She reports she has been taking tizanidine for lower back pain  She reports this is too sedating and would like to switch to another, like flexeril  She reports the next AM she is groggy and feels "a weight on my head"    She also has concerns about her Zepbound  She has been without it for a month due to pharmacy issues   She reports she is still having symptoms of restless legs  She has tried taking iron supplements for the past 2 months but is still having RLS   Observations/Objective:  Due to the nature of the virtual visit, physical exam and observations are limited. Able to obtain the following observations:   Alert, oriented, Appears comfortable, in no acute distress.  No scleral injection, no appreciated hoarseness, tachypnea, wheeze or strider. Able to maintain conversation without visible strain.  No cough appreciated during visit.    Assessment and Plan:  Problem List Items Addressed This Visit       Other   Class 3 severe obesity with serious comorbidity and body mass index (BMI) of 40.0 to 44.9 in adult [facility]) - Primary    Chronic, ongoing  She was taking Zepbound but was not able to get 5.0 mg dose for  over a month She would like to restart this for weight management  Will send in script for 2.5 mg weekly injection and attempt ramp up again  Recommend diet and exercise to supplement medication action  Will need weight check in next 2-3 months for monitoring  Follow up in 3 months or sooner if concerns arise      Relevant Medications   tirzepatide (ZEPBOUND) 2.5 MG/0.5ML injection vial   tirzepatide 5 MG/0.5ML injection vial (Start on 11/10/2023)   Insomnia    Chronic, ongoing condition  Reports improvement in symptoms with Quviviq 25 mg PO at bedtime Continue current regimen PDMP reviewed, no evidence of worrisome controlled substance use patterns at this time  Refills provided today Follow up in 3 months or sooner if concerns arise      Relevant Medications   Daridorexant HCl (QUVIVIQ) 25 MG TABS   Low back pain    Chronic, historic condition She would like  to return to Flexeril 5 mg PO PRN for pain management instead of tizanidine  Reviewed that this can be sedating and should not be used at same time as sleep agent Follow up as needed for persistent or progressing symptoms        Relevant Medications   cyclobenzaprine (FLEXERIL) 5 MG tablet   PLMD (periodic limb movement disorder)    Chronic, ongoing  Does not seem improved with iron supplementation x 2 months Will send in  script for Mirapex 0.125 mg PO at bedtime to assist with RLS  Follow up as needed for persistent or progressing symptoms or in 3 months       Relevant Medications   pramipexole (MIRAPEX) 0.125 MG tablet     Follow Up Instructions:    I discussed the assessment and treatment plan with the patient. The patient was provided an opportunity to ask questions and all were answered. The patient agreed with the plan and demonstrated an understanding of the instructions.   The patient was advised to call back or seek an in-person evaluation if the symptoms worsen or if the condition fails to improve as  anticipated.  I provided 13 minutes of non-face-to-face time during this encounter.   Return in about 3 months (around 01/10/2024) for obesity, RLS, insomnia.   I, Mackenzey Crownover E Evangeline Utley, PA-C, have reviewed all documentation for this visit. The documentation on 10/12/23 for the exam, diagnosis, procedures, and orders are all accurate and complete.   Jacquelin Hawking, MHS, PA-C [facility]

## 2023-10-11 ENCOUNTER — Other Ambulatory Visit: Payer: Self-pay | Admitting: Obstetrics and Gynecology

## 2023-10-11 ENCOUNTER — Other Ambulatory Visit: Payer: Self-pay

## 2023-10-11 ENCOUNTER — Ambulatory Visit (INDEPENDENT_AMBULATORY_CARE_PROVIDER_SITE_OTHER): Payer: 59 | Admitting: Obstetrics and Gynecology

## 2023-10-11 ENCOUNTER — Encounter: Payer: Self-pay | Admitting: Obstetrics and Gynecology

## 2023-10-11 VITALS — BP 128/79 | HR 77 | Ht 65.0 in | Wt 284.3 lb

## 2023-10-11 DIAGNOSIS — N6310 Unspecified lump in the right breast, unspecified quadrant: Secondary | ICD-10-CM

## 2023-10-11 DIAGNOSIS — N939 Abnormal uterine and vaginal bleeding, unspecified: Secondary | ICD-10-CM | POA: Diagnosis not present

## 2023-10-11 DIAGNOSIS — R928 Other abnormal and inconclusive findings on diagnostic imaging of breast: Secondary | ICD-10-CM

## 2023-10-11 DIAGNOSIS — Z1231 Encounter for screening mammogram for malignant neoplasm of breast: Secondary | ICD-10-CM

## 2023-10-11 MED ORDER — NORETHINDRONE ACETATE 5 MG PO TABS
5.0000 mg | ORAL_TABLET | Freq: Every day | ORAL | 2 refills | Status: DC
Start: 2023-10-11 — End: 2023-12-25

## 2023-10-11 NOTE — Progress Notes (Unsigned)
NEW GYNECOLOGY PATIENT Patient name: Connie Sims MRN [id number]  Date of birth: Nov 09, 1981 Chief Complaint:   Menorrhagia     History:  Connie Sims is a 42 y.o. G0P0 being seen today for AUB and interest in endometrial ablation.  Has always had heavy bleeding and PCP recommend ablation. Menses are typically at least once a month, lasting 6-10 days (for about 1 year). Before, with PCOS they were every 3 months and about 3 day. Products used: tampons, and if they get heavy wiill get panitliner in case of overflow.  Will change tmapon every 3-4 hours, using supers. Also has pain with the menses, comes and goes. Will take midol or advil and they typically help.  OCPs previously used to regular the menses   No prior IUD use CHC contraindications: none      Gynecologic History Patient's last menstrual period was 10/10/2023 (exact date). Contraception: none Last Pap:     Component Value Date/Time   DIAGPAP  03/07/2022 1151    - Negative for intraepithelial lesion or malignancy (NILM)   HPVHIGH Negative 03/07/2022 1151   ADEQPAP  03/07/2022 1151    Satisfactory for evaluation; transformation zone component PRESENT.    High Risk HPV: Positive  Adequacy:  Satisfactory for evaluation, transformation zone component PRESENT  Diagnosis:  Atypical squamous cells of undetermined significance (ASC-US)  Last Mammogram:  BIRADS 3, 07/2021 Last Colonoscopy: n/a  Obstetric History OB History  Gravida Para Term Preterm AB Living  0            SAB IAB Ectopic Multiple Live Births             Obstetric Comments  1st Menstrual Cycle:  11      Past Medical History:  Diagnosis Date   Allergic rhinitis    Allergic rhinitis with postnasal drip    Congenital osteopetrosis    previously managed by specialist at [facility], periodic hearing tests   Diabetes mellitus without complication (HCC)    Epigastric pain    Fibroids    Foot fracture, right    Jones fx 5th metatarsal   Impaired  fasting glucose    Insomnia    Low back pain    LUQ pain    Microcytosis    Morbid obesity with BMI of 40.0-44.9, adult (HCC)    PCOS (polycystic ovarian syndrome)    Trochanteric bursitis of right hip     Past Surgical History:  Procedure Laterality Date   NASAL SINUS SURGERY      Current Outpatient Medications on File Prior to Visit  Medication Sig Dispense Refill   cyclobenzaprine (FLEXERIL) 5 MG tablet Take 1 tablet (5 mg total) by mouth 3 (three) times daily as needed for muscle spasms. 90 tablet 0   Daridorexant HCl (QUVIVIQ) 25 MG TABS Take 1 tablet (25 mg total) by mouth at bedtime. 30 tablet 3   Iron, Ferrous Sulfate, 325 (65 Fe) MG TABS Take 325 mg by mouth 3 (three) times daily. 100 tablet 1   pantoprazole (PROTONIX) 40 MG tablet Take 1 tablet (40 mg total) by mouth daily before breakfast. 90 tablet 1   pramipexole (MIRAPEX) 0.125 MG tablet Take 1 tablet (0.125 mg total) by mouth at bedtime. 60 tablet 0   tirzepatide (ZEPBOUND) 2.5 MG/0.5ML injection vial Inject 2.5 mg into the skin once a week. 1 mL 1   [START ON 11/10/2023] tirzepatide 5 MG/0.5ML injection vial Inject 5 mg into the skin once a week. (Patient not  taking: Reported on 10/11/2023) 1 mL 1   No current facility-administered medications on file prior to visit.    No Known Allergies  Social History:  reports that she has never smoked. She has never used smokeless tobacco. She reports that she does not drink alcohol and does not use drugs.  Family History  Problem Relation Age of Onset   Liver disease Mother        fatty liver, s/p liver biopsy   Alcohol abuse Mother    Hypertension Maternal Grandmother    Glaucoma Maternal Grandmother    Diabetes Maternal Grandfather    Heart disease Maternal Grandfather    Breast cancer Neg Hx    Ovarian cancer Neg Hx    Colon cancer Neg Hx     The following portions of the patient's history were reviewed and updated as appropriate: allergies, current medications,  past family history, past medical history, past social history, past surgical history and problem list.  Review of Systems Pertinent items noted in HPI and remainder of comprehensive ROS otherwise negative.  Physical Exam:  BP 128/79   Pulse 77   Ht 5\' 5"  (1.651 m)   Wt 284 lb 4.8 oz (129 kg)   LMP 10/10/2023 (Exact Date)   BMI 47.31 kg/m  Physical Exam Vitals and nursing note reviewed.  Constitutional:      Appearance: Normal appearance.  Cardiovascular:     Rate and Rhythm: Normal rate.  Pulmonary:     Effort: Pulmonary effort is normal.     Breath sounds: Normal breath sounds.  Neurological:     General: No focal deficit present.     Mental Status: She is alert and oriented to person, place, and time.  Psychiatric:        Mood and Affect: Mood normal.        Behavior: Behavior normal.        Thought Content: Thought content normal.        Judgment: Judgment normal.        Assessment and Plan:  1. Abnormal uterine bleeding (AUB) Patient has abnormal uterine bleeding . Will order abnormal uterine bleeding evaluation labs and pelvic ultrasound to evaluate for any structural gynecologic abnormalities.  Will contact patient with these results and plans for further evaluation/management. A1c wnl from 05/2023. Suspect perimenopausal changes.  Will return for EMB. Would like to proceed with endometrial ablation for bleeding management.  - US PELVIC COMPLETE WITH TRANSVAGINAL; Future - Follicle stimulating hormone - TSH Rfx on Abnormal to Free T4 - norethindrone (AYGESTIN) 5 MG tablet; Take 1 tablet (5 mg total) by mouth daily.  Dispense: 30 tablet; Refill: 2  2. Visit for screening mammogram Mammo ordered   Routine preventative health maintenance measures emphasized. Please refer to After Visit Summary for other counseling recommendations.   Follow-up: No follow-ups on file.      Lorriane Shire, MD Obstetrician & Gynecologist, Faculty Practice Minimally Invasive  Gynecologic Surgery Center for [employer], [facility]

## 2023-10-12 LAB — TSH RFX ON ABNORMAL TO FREE T4: TSH: 1.49 u[IU]/mL (ref 0.450–4.500)

## 2023-10-12 LAB — FOLLICLE STIMULATING HORMONE: FSH: 6.4 m[IU]/mL

## 2023-10-12 NOTE — Assessment & Plan Note (Addendum)
Chronic, ongoing  Does not seem improved with iron supplementation x 2 months Will send in script for Mirapex 0.125 mg PO at bedtime to assist with RLS  Follow up as needed for persistent or progressing symptoms or in 3 months

## 2023-10-12 NOTE — Assessment & Plan Note (Signed)
Chronic, ongoing condition  Reports improvement in symptoms with Quviviq 25 mg PO at bedtime Continue current regimen PDMP reviewed, no evidence of worrisome controlled substance use patterns at this time  Refills provided today Follow up in 3 months or sooner if concerns arise

## 2023-10-12 NOTE — Assessment & Plan Note (Signed)
Chronic, historic condition She would like  to return to Flexeril 5 mg PO PRN for pain management instead of tizanidine  Reviewed that this can be sedating and should not be used at same time as sleep agent Follow up as needed for persistent or progressing symptoms

## 2023-10-12 NOTE — Assessment & Plan Note (Signed)
Chronic, ongoing  She was taking Zepbound but was not able to get 5.0 mg dose for over a month She would like to restart this for weight management  Will send in script for 2.5 mg weekly injection and attempt ramp up again  Recommend diet and exercise to supplement medication action  Will need weight check in next 2-3 months for monitoring  Follow up in 3 months or sooner if concerns arise

## 2023-10-13 ENCOUNTER — Other Ambulatory Visit: Payer: Self-pay | Admitting: Family Medicine

## 2023-10-13 ENCOUNTER — Telehealth: Payer: Self-pay

## 2023-10-13 ENCOUNTER — Encounter: Payer: Self-pay | Admitting: Physician Assistant

## 2023-10-13 MED ORDER — ZEPBOUND 2.5 MG/0.5ML ~~LOC~~ SOAJ: 2.5000 mg | SUBCUTANEOUS | 0 refills | Status: DC

## 2023-10-13 NOTE — Telephone Encounter (Signed)
PA approved.

## 2023-10-13 NOTE — Telephone Encounter (Signed)
PA done through cover my meds waiting on insurance to determine

## 2023-10-16 ENCOUNTER — Ambulatory Visit (HOSPITAL_BASED_OUTPATIENT_CLINIC_OR_DEPARTMENT_OTHER)
Admission: RE | Admit: 2023-10-16 | Discharge: 2023-10-16 | Disposition: A | Discharge status: Home / Self Care | Payer: 59 | Source: Ambulatory Visit | Attending: Obstetrics and Gynecology | Admitting: Obstetrics and Gynecology

## 2023-10-16 DIAGNOSIS — N939 Abnormal uterine and vaginal bleeding, unspecified: Secondary | ICD-10-CM | POA: Insufficient documentation

## 2023-10-18 ENCOUNTER — Telehealth: Payer: Self-pay

## 2023-10-18 NOTE — Telephone Encounter (Signed)
Called patient to see if she was available on 12/04/23 for surgery w/ Dr. Briscoe Deutscher? Left a voicemail requesting a call back at [phone].

## 2023-10-19 ENCOUNTER — Other Ambulatory Visit: Payer: Self-pay

## 2023-10-19 ENCOUNTER — Ambulatory Visit: Payer: 59 | Admitting: Obstetrics and Gynecology

## 2023-10-19 ENCOUNTER — Encounter: Payer: Self-pay | Admitting: Obstetrics and Gynecology

## 2023-10-19 ENCOUNTER — Other Ambulatory Visit (HOSPITAL_COMMUNITY)
Admission: RE | Admit: 2023-10-19 | Discharge: 2023-10-19 | Disposition: A | Discharge status: Home / Self Care | Payer: 59 | Source: Ambulatory Visit | Attending: Obstetrics and Gynecology | Admitting: Obstetrics and Gynecology

## 2023-10-19 VITALS — BP 116/82 | HR 76 | Wt 283.1 lb

## 2023-10-19 DIAGNOSIS — Z1330 Encounter for screening examination for mental health and behavioral disorders, unspecified: Secondary | ICD-10-CM

## 2023-10-19 DIAGNOSIS — N939 Abnormal uterine and vaginal bleeding, unspecified: Secondary | ICD-10-CM | POA: Insufficient documentation

## 2023-10-19 LAB — POCT PREGNANCY, URINE: Preg Test, Ur: NEGATIVE

## 2023-10-19 NOTE — Progress Notes (Signed)
GYNECOLOGY VISIT  Patient name: [name] MRN [id number]  Date of birth: 1981-11-28 Chief Complaint:   Menstrual Problem and Procedure  History:  Connie Sims is a 42 y.o. G0P0 being seen today for discussion of endometrial ablation and EMB. She is sure she would like to proceed with ablation.     Past Medical History:  Diagnosis Date   Allergic rhinitis    Allergic rhinitis with postnasal drip    Congenital osteopetrosis    previously managed by specialist at [facility], periodic hearing tests   Diabetes mellitus without complication (HCC)    Epigastric pain    Fibroids    Foot fracture, right    Jones fx 5th metatarsal   Impaired fasting glucose    Insomnia    Low back pain    LUQ pain    Microcytosis    Morbid obesity with BMI of 40.0-44.9, adult (HCC)    PCOS (polycystic ovarian syndrome)    Trochanteric bursitis of right hip     Past Surgical History:  Procedure Laterality Date   NASAL SINUS SURGERY      The following portions of the patient's history were reviewed and updated as appropriate: allergies, current medications, past family history, past medical history, past social history, past surgical history and problem list.   Health Maintenance:   Last pap     Component Value Date/Time   DIAGPAP  03/07/2022 1151    - Negative for intraepithelial lesion or malignancy (NILM)   HPVHIGH Negative 03/07/2022 1151   ADEQPAP  03/07/2022 1151    Satisfactory for evaluation; transformation zone component PRESENT.    High Risk HPV: Positive  Adequacy:  Satisfactory for evaluation, transformation zone component PRESENT  Diagnosis:  Atypical squamous cells of undetermined significance (ASC-US)  Last mammogram: 07/2021 BIRADS 3   Review of Systems:  Pertinent items are noted in HPI. Comprehensive review of systems was otherwise negative.   Objective:  Physical Exam BP 116/82   Pulse 76   Wt 283 lb 1.6 oz (128.4 kg)   LMP 10/10/2023 (Exact Date)    BMI 47.11 kg/m    Physical Exam Vitals and nursing note reviewed. Exam conducted with a chaperone present.  Constitutional:      Appearance: Normal appearance.  HENT:     Head: Normocephalic and atraumatic.  Pulmonary:     Effort: Pulmonary effort is normal.     Breath sounds: Normal breath sounds.  Genitourinary:    General: Normal vulva.     Exam position: Lithotomy position.     Vagina: Normal.     Cervix: Normal.  Skin:    General: Skin is warm and dry.  Neurological:     General: No focal deficit present.     Mental Status: She is alert.  Psychiatric:        Mood and Affect: Mood normal.        Behavior: Behavior normal.        Thought Content: Thought content normal.        Judgment: Judgment normal.     Endometrial Biopsy Procedure  Patient identified, informed consent performed,  indication reviewed, consent signed.  Reviewed risk of perforation, pain, bleeding, insufficient sample, etc were reviewd. Time out was performed.  Urine pregnancy test negative.  Speculum placed in the vagina.  Cervix visualized.  Cleaned with Betadine x 2.  Anterior cervix was infiltrated with 1% lidocaine and grasped anteriorly with a single tooth tenaculum.  Paracervical block was  administered.  Endometrial pipelle was used to draw up 1cc of 1% lidocaine, introduced into the cervical os and instilled into the endometrial cavity.  The pipelle was passed twice without difficulty and sample obtained. Tenaculum was removed, good hemostasis noted.  Patient tolerated procedure well.  Patient was given post-procedure instructions.      Assessment & Plan:   1. Abnormal uterine bleeding (AUB) Now s/p uncomplicated EMB in office, will follow up results. TVUS not yet resolved but no obvious structural contribution noted on personal review. TSH and FSH wnl, A1c elevated to pre-diabetic range.   Patient desires surgical management with endometrial ablation.  The risks of surgery were discussed in  detail with the patient including but not limited to: bleeding which may require transfusion or reoperation; infection which may require prolonged hospitalization or re-hospitalization and antibiotic therapy; injury to bowel, bladder, ureters and major vessels or other surrounding organs which may lead to other procedures; formation of adhesions; need for additional procedures including laparotomy or subsequent procedures secondary to intraoperative injury or abnormal pathology; thromboembolic phenomenon; incisional problems and other postoperative or anesthesia complications.  The postoperative expectations were also discussed in detail. The patient also understands the alternative treatment options which were discussed in full. All questions were answered.  She was told that she will be contacted by our surgical scheduler regarding the time and date of her surgery; routine preoperative instructions will be given to her by the preoperative nursing team.    Printed patient education handouts about the procedure were given to the patient to review at home.     Routine preventative health maintenance measures emphasized.  Lorriane Shire, MD Minimally Invasive Gynecologic Surgery Center for [facility], [facility]

## 2023-10-19 NOTE — Patient Instructions (Addendum)
Return call to Donne Hazel to schedule your surgery at  [phone]

## 2023-10-19 NOTE — Addendum Note (Signed)
Addended by: Harlon Ditty on: 10/19/2023 04:49 PM   Modules accepted: Orders

## 2023-10-23 LAB — SURGICAL PATHOLOGY

## 2023-10-24 ENCOUNTER — Telehealth: Payer: Self-pay | Admitting: Obstetrics and Gynecology

## 2023-10-24 NOTE — Telephone Encounter (Signed)
Called patient x2, no answer, LVM. Will attempt to reach patient again tomorrow

## 2023-10-25 ENCOUNTER — Telehealth: Payer: Self-pay | Admitting: Obstetrics and Gynecology

## 2023-10-25 NOTE — Telephone Encounter (Signed)
Called  patient x 2, no answer LVM

## 2023-10-26 ENCOUNTER — Telehealth: Payer: Self-pay | Admitting: Obstetrics and Gynecology

## 2023-10-26 DIAGNOSIS — N8502 Endometrial intraepithelial neoplasia [EIN]: Secondary | ICD-10-CM

## 2023-10-26 DIAGNOSIS — N939 Abnormal uterine and vaginal bleeding, unspecified: Secondary | ICD-10-CM

## 2023-10-26 NOTE — Telephone Encounter (Signed)
Called patient and confirmed ID x2. Reviewed pathology and demonstration of EIN. Recommend against endometrial ablation with this pathology and this should be treated with hysterectomy. Referral place to gyn onc due to increased risk of malignancy in the setting of EIN. Patient verbalized understanding, all questions answered.   FINAL MICROSCOPIC DIAGNOSIS:   A. 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       Negative for carcinoma.

## 2023-10-26 NOTE — Addendum Note (Signed)
Addended by: Harlon Ditty on: 10/26/2023 06:02 PM   Modules accepted: Orders

## 2023-10-29 ENCOUNTER — Encounter: Payer: Self-pay | Admitting: Physician Assistant

## 2023-10-29 DIAGNOSIS — G8929 Other chronic pain: Secondary | ICD-10-CM

## 2023-10-29 DIAGNOSIS — G4761 Periodic limb movement disorder: Secondary | ICD-10-CM

## 2023-10-30 MED ORDER — CYCLOBENZAPRINE HCL 10 MG PO TABS
10.0000 mg | ORAL_TABLET | Freq: Three times a day (TID) | ORAL | 0 refills | Status: DC | PRN
Start: 2023-10-30 — End: 2023-12-29

## 2023-10-30 MED ORDER — PRAMIPEXOLE DIHYDROCHLORIDE 0.5 MG PO TABS: 0.5000 mg | ORAL_TABLET | Freq: Every day | ORAL | 0 refills | Status: DC

## 2023-10-31 ENCOUNTER — Telehealth: Payer: Self-pay

## 2023-10-31 NOTE — Telephone Encounter (Signed)
Called to schedule surgery with Dr. Briscoe Deutscher. Patient states, Dr [place] has referred her to another provider, and the surgery request is no longer needed.

## 2023-10-31 NOTE — Telephone Encounter (Signed)
-----   Message from Lorriane Shire sent at 10/17/2023  3:42 PM EDT ----- Regarding: RE: surgery For now going to plan for novasure ablation with hysteroscope unless her US shows me something different.  Thanks,  Ajewole ----- Message ----- From: Donne Hazel Sent: 10/17/2023   1:33 PM EDT To: Lorriane Shire, MD Subject: RE: surgery                                    Dr. Briscoe Deutscher,  Would this procedure be considered thermo-ablation using the hysteroscope? CPT [phone]?  Thanks, Serita Sheller ----- Message ----- From: Lorriane Shire, MD Sent: 10/16/2023   7:41 AM EDT To: Donne Hazel Subject: RE: surgery                                    Ok, I'm not sure if it's live but I have a couple of requests from last week that I will be sending you today.  Thanks,  Ajewole ----- Message ----- From: Donne Hazel Sent: 10/13/2023  12:03 PM EDT To: Lorriane Shire, MD Subject: RE: surgery                                    Please keep sending the orders this way until the referral que is live.  Thanks, Serita Sheller ----- Message ----- From: Lorriane Shire, MD Sent: 10/13/2023  11:34 AM EDT To: Donne Hazel Subject: surgery                                        Hi,  Let me know if it's double work and I don't need to send a message. Just put in surgical request for endometrial ablation for this patient.  Thank you,  Ajewole

## 2023-11-01 ENCOUNTER — Encounter: Payer: Self-pay | Admitting: Obstetrics and Gynecology

## 2023-11-02 ENCOUNTER — Telehealth: Payer: Self-pay | Admitting: *Deleted

## 2023-11-02 NOTE — Telephone Encounter (Signed)
Spoke with Connie Sims regarding her referral to GYN oncology. She has an appointment scheduled with Dr. Alvester Morin on 11/20/23 at 10:30. Patient agrees to date and time. She has been provided with office address and location. She is also aware of our mask and visitor policy. Patient verbalized understanding and will call with any questions.

## 2023-11-02 NOTE — Telephone Encounter (Signed)
LMOM for the patient to call the office back. Patient needs to be scheduled for a new patient appt  

## 2023-11-03 ENCOUNTER — Ambulatory Visit
Admission: RE | Admit: 2023-11-03 | Discharge: 2023-11-03 | Disposition: A | Discharge status: Home / Self Care | Payer: 59 | Source: Ambulatory Visit | Attending: Obstetrics and Gynecology | Admitting: Obstetrics and Gynecology

## 2023-11-03 ENCOUNTER — Ambulatory Visit
Admission: RE | Admit: 2023-11-03 | Discharge: 2023-11-03 | Disposition: A | Discharge status: Home / Self Care | Payer: 59 | Source: Ambulatory Visit | Attending: Obstetrics and Gynecology

## 2023-11-03 DIAGNOSIS — N6310 Unspecified lump in the right breast, unspecified quadrant: Secondary | ICD-10-CM

## 2023-11-14 ENCOUNTER — Other Ambulatory Visit: Payer: Self-pay | Admitting: Family Medicine

## 2023-11-15 ENCOUNTER — Encounter: Payer: Self-pay | Admitting: Psychiatry

## 2023-11-17 ENCOUNTER — Encounter: Payer: Self-pay | Admitting: Physician Assistant

## 2023-11-17 MED ORDER — ZEPBOUND 5 MG/0.5ML ~~LOC~~ SOAJ: 5.0000 mg | SUBCUTANEOUS | 0 refills | Status: DC

## 2023-11-17 MED ORDER — ZEPBOUND 2.5 MG/0.5ML ~~LOC~~ SOAJ: 2.5000 mg | SUBCUTANEOUS | 0 refills | Status: DC

## 2023-11-20 ENCOUNTER — Inpatient Hospital Stay (HOSPITAL_BASED_OUTPATIENT_CLINIC_OR_DEPARTMENT_OTHER): Payer: 59 | Admitting: Gynecologic Oncology

## 2023-11-20 ENCOUNTER — Encounter: Payer: Self-pay | Admitting: Psychiatry

## 2023-11-20 ENCOUNTER — Other Ambulatory Visit: Payer: Self-pay | Admitting: Gynecologic Oncology

## 2023-11-20 ENCOUNTER — Inpatient Hospital Stay: Payer: 59 | Attending: Psychiatry | Admitting: Psychiatry

## 2023-11-20 ENCOUNTER — Encounter: Payer: Self-pay | Admitting: Gynecologic Oncology

## 2023-11-20 VITALS — BP 125/84 | HR 76 | Temp 98.1°F | Resp 20 | Ht 65.0 in | Wt 271.0 lb

## 2023-11-20 DIAGNOSIS — N939 Abnormal uterine and vaginal bleeding, unspecified: Secondary | ICD-10-CM | POA: Insufficient documentation

## 2023-11-20 DIAGNOSIS — E119 Type 2 diabetes mellitus without complications: Secondary | ICD-10-CM | POA: Insufficient documentation

## 2023-11-20 DIAGNOSIS — Z6841 Body Mass Index (BMI) 40.0 and over, adult: Secondary | ICD-10-CM | POA: Diagnosis not present

## 2023-11-20 DIAGNOSIS — Z8049 Family history of malignant neoplasm of other genital organs: Secondary | ICD-10-CM | POA: Diagnosis not present

## 2023-11-20 DIAGNOSIS — N8502 Endometrial intraepithelial neoplasia [EIN]: Secondary | ICD-10-CM

## 2023-11-20 DIAGNOSIS — E282 Polycystic ovarian syndrome: Secondary | ICD-10-CM | POA: Insufficient documentation

## 2023-11-20 DIAGNOSIS — Z79899 Other long term (current) drug therapy: Secondary | ICD-10-CM | POA: Diagnosis not present

## 2023-11-20 DIAGNOSIS — Z793 Long term (current) use of hormonal contraceptives: Secondary | ICD-10-CM | POA: Insufficient documentation

## 2023-11-20 DIAGNOSIS — J309 Allergic rhinitis, unspecified: Secondary | ICD-10-CM | POA: Insufficient documentation

## 2023-11-20 MED ORDER — SENNOSIDES-DOCUSATE SODIUM 8.6-50 MG PO TABS: 2.0000 | ORAL_TABLET | Freq: Every day | ORAL | 0 refills | Status: DC

## 2023-11-20 MED ORDER — NORETHINDRONE ACETATE 5 MG PO TABS: 5.0000 mg | ORAL_TABLET | Freq: Three times a day (TID) | ORAL | 1 refills | Status: DC

## 2023-11-20 MED ORDER — TRAMADOL HCL 50 MG PO TABS: 50.0000 mg | ORAL_TABLET | Freq: Four times a day (QID) | ORAL | 0 refills | Status: DC | PRN

## 2023-11-20 NOTE — Patient Instructions (Addendum)
Preparing for your Surgery  Plan for surgery on January 01, 2023 with Dr. Clide Cliff at [facility]. You will be scheduled for robotic assisted total laparoscopic hysterectomy (removal of the uterus and cervix), bilateral salpingectomy (removal of both fallopian tubes), sentinel lymph node biopsy, possible dilation and curettage of the uterus with Mirena IUD placement if unable to tolerate positioning for robotic surgery.   Pre-operative Testing -You will receive a phone call from presurgical testing at [facility] to arrange for a pre-operative appointment and lab work.  -Bring your insurance card, copy of an advanced directive if applicable, medication list  -At that visit, you will be asked to sign a consent for a possible blood transfusion in case a transfusion becomes necessary during surgery.  The need for a blood transfusion is rare but having consent is a necessary part of your care.     -You should not be taking blood thinners or aspirin at least ten days prior to surgery unless instructed by your surgeon.  -Do not take supplements such as fish oil (omega 3), red yeast rice, turmeric before your surgery. You want to avoid medications with aspirin in them including headache powders such as BC or Goody's), Excedrin migraine.  Day Before Surgery at Home -You will be asked to take in a light diet the day before surgery. You will be advised you can have clear liquids up until 3 hours before your surgery.    Eat a light diet the day before surgery.  Examples including soups, broths, toast, yogurt, mashed potatoes.  AVOID GAS PRODUCING FOODS AND BEVERAGES. Things to avoid include carbonated beverages (fizzy beverages, sodas), raw fruits and raw vegetables (uncooked), or beans.   If your bowels are filled with gas, your surgeon will have difficulty visualizing your pelvic organs which increases your surgical risks.  Your role in recovery Your role is to become active as  soon as directed by your doctor, while still giving yourself time to heal.  Rest when you feel tired. You will be asked to do the following in order to speed your recovery:  - Cough and breathe deeply. This helps to clear and expand your lungs and can prevent pneumonia after surgery.  - STAY ACTIVE WHEN YOU GET HOME. Do mild physical activity. Walking or moving your legs help your circulation and body functions return to normal. Do not try to get up or walk alone the first time after surgery.   -If you develop swelling on one leg or the other, pain in the back of your leg, redness/warmth in one of your legs, please call the office or go to the Emergency Room to have a doppler to rule out a blood clot. For shortness of breath, chest pain-seek care in the Emergency Room as soon as possible. - Actively manage your pain. Managing your pain lets you move in comfort. We will ask you to rate your pain on a scale of zero to 10. It is your responsibility to tell your doctor or nurse where and how much you hurt so your pain can be treated.  Special Considerations -If you are diabetic, you may be placed on insulin after surgery to have closer control over your blood sugars to promote healing and recovery.  This does not mean that you will be discharged on insulin.  If applicable, your oral antidiabetics will be resumed when you are tolerating a solid diet.  -Your final pathology results from surgery should be available around one week  after surgery and the results will be relayed to you when available.  -Dr. Antionette Char is the surgeon that assists your GYN Oncologist with surgery.  If you end up staying the night, the next day after your surgery you will either see Dr. Pricilla Holm, Dr. Alvester Morin, or Dr. Antionette Char.  -FMLA forms can be faxed to [phone] and please allow 5-7 business days for completion.  Pain Management After Surgery -You will be prescribed your pain medication and bowel regimen  medications before surgery so that you can have these available when you are discharged from the hospital. The pain medication is for use ONLY AFTER surgery and a new prescription will not be given.   -Make sure that you have Tylenol and Ibuprofen IF YOU ARE ABLE TO TAKE THESE MEDICATIONS at home to use on a regular basis after surgery for pain control. We recommend alternating the medications every hour to six hours since they work differently and are processed in the body differently for pain relief.  -Review the attached handout on narcotic use and their risks and side effects.   Bowel Regimen -You will be prescribed Sennakot-S to take nightly to prevent constipation especially if you are taking the narcotic pain medication intermittently.  It is important to prevent constipation and drink adequate amounts of liquids. You can stop taking this medication when you are not taking pain medication and you are back on your normal bowel routine.  Risks of Surgery Risks of surgery are low but include bleeding, infection, damage to surrounding structures, re-operation, blood clots, and very rarely death.   Blood Transfusion Information (For the consent to be signed before surgery)  We will be checking your blood type before surgery so in case of emergencies, we will know what type of blood you would need.                                            WHAT IS A BLOOD TRANSFUSION?  A transfusion is the replacement of blood or some of its parts. Blood is made up of multiple cells which provide different functions. Red blood cells carry oxygen and are used for blood loss replacement. White blood cells fight against infection. Platelets control bleeding. Plasma helps clot blood. Other blood products are available for specialized needs, such as hemophilia or other clotting disorders. BEFORE THE TRANSFUSION  Who gives blood for transfusions?  You may be able to donate blood to be used at a later date on  yourself (autologous donation). Relatives can be asked to donate blood. This is generally not any safer than if you have received blood from a stranger. The same precautions are taken to ensure safety when a relative's blood is donated. Healthy volunteers who are fully evaluated to make sure their blood is safe. This is blood bank blood. Transfusion therapy is the safest it has ever been in the practice of medicine. Before blood is taken from a donor, a complete history is taken to make sure that person has no history of diseases nor engages in risky social behavior (examples are intravenous drug use or sexual activity with multiple partners). The donor's travel history is screened to minimize risk of transmitting infections, such as malaria. The donated blood is tested for signs of infectious diseases, such as HIV and hepatitis. The blood is then tested to be sure it is compatible with you  in order to minimize the chance of a transfusion reaction. If you or a relative donates blood, this is often done in anticipation of surgery and is not appropriate for emergency situations. It takes many days to process the donated blood. RISKS AND COMPLICATIONS Although transfusion therapy is very safe and saves many lives, the main dangers of transfusion include:  Getting an infectious disease. Developing a transfusion reaction. This is an allergic reaction to something in the blood you were given. Every precaution is taken to prevent this. The decision to have a blood transfusion has been considered carefully by your caregiver before blood is given. Blood is not given unless the benefits outweigh the risks.  AFTER SURGERY INSTRUCTIONS  Return to work: 4-6 weeks if applicable  Activity: 1. Be up and out of the bed during the day.  Take a nap if needed.  You may walk up steps but be careful and use the hand rail.  Stair climbing will tire you more than you think, you may need to stop part way and rest.   2. No  lifting or straining for 6 weeks over 10 pounds. No pushing, pulling, straining for 6 weeks.  3. No driving for around 1-61 days when the following criteria have been met.  Do not drive if you are taking narcotic pain medicine and make sure that your reaction time has returned.   4. You can shower as soon as the next day after surgery. Shower daily.  Use your regular soap and water (not directly on the incision) and pat your incision(s) dry afterwards; don't rub.  No tub baths or submerging your body in water until cleared by your surgeon. If you have the soap that was given to you by pre-surgical testing that was used before surgery, you do not need to use it afterwards because this can irritate your incisions.   5. No sexual activity and nothing in the vagina for 12 weeks.  6. You may experience a small amount of clear drainage from your incisions, which is normal.  If the drainage persists, increases, or changes color please call the office.  7. Do not use creams, lotions, or ointments such as neosporin on your incisions after surgery until advised by your surgeon because they can cause removal of the dermabond glue on your incisions.    8. You may experience vaginal spotting after surgery or when the stitches at the top of the vagina begin to dissolve.  The spotting is normal but if you experience heavy bleeding, call our office.  9. Take Tylenol or ibuprofen first for pain if you are able to take these medications and only use narcotic pain medication for severe pain not relieved by the Tylenol or Ibuprofen.  Monitor your Tylenol intake to a max of 4,000 mg in a 24 hour period. You can alternate these medications after surgery.  Diet: 1. Low sodium Heart Healthy Diet is recommended but you are cleared to resume your normal (before surgery) diet after your procedure.  2. It is safe to use a laxative, such as Miralax or Colace, if you have difficulty moving your bowels. You have been prescribed  Sennakot-S to take at bedtime every evening after surgery to keep bowel movements regular and to prevent constipation.    Wound Care: 1. Keep clean and dry.  Shower daily.  Reasons to call the Doctor: Fever - Oral temperature greater than 100.4 degrees Fahrenheit Foul-smelling vaginal discharge Difficulty urinating Nausea and vomiting Increased pain at the  site of the incision that is unrelieved with pain medicine. Difficulty breathing with or without chest pain New calf pain especially if only on one side Sudden, continuing increased vaginal bleeding with or without clots.   Contacts: For questions or concerns you should contact:  Dr. Clide Cliff at [phone]  Warner Mccreedy, NP at [phone]  After Hours: call [phone] and have the GYN Oncologist paged/contacted (after 5 pm or on the weekends). You will speak with an after hours RN and let he or she know you have had surgery.  Messages sent via mychart are for non-urgent matters and are not responded to after hours so for urgent needs, please call the after hours number.

## 2023-11-20 NOTE — Progress Notes (Signed)
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    We discussed that surgical management is the recommended treatment unless the patient is not a suitable surgical candidate or otherwise desiring to maintain fertility.Patient is an appropriate candidate for surgery and desires to proceed with surgical management.    The patient is a suitable candidate for hysterectomy via a minimally invasive approach to surgery.  Given that she is premenopausal, we reviewed retaining ovaries unless abnormal in appearance. Simple appearing cyst on ovary on ultrasound. Would perform bilateral salpingectomy. We reviewed that robotic assistance would be used to complete the surgery.  We discussed that endometrial cancer is detected in about 40% of final uterine pathology specimens from patients with EIN. Given this, we would recommend evaluation of lymph nodes at time of surgery with a sentinel lymph node evaluation.  We reviewed the sentinel lymph node technique. Risks and benefits of sentinel lymph node biopsy was reviewed. We reviewed the technique and ICG dye. The patient DOES NOT have an iodine allergy or known liver dysfunction. We reviewed the false negative rate (0.4%), and that 3% of patients with metastatic disease will not have it detected by SLN biopsy in endometrial cancer. A low risk of allergic reaction to the dye, <0.2% for ICG, has been reported. We also discussed that in the case of failed mapping, which occurs 40% of the time, a bilateral or unilateral lymphadenectomy will be performed at the surgeon's discretion.   Potential benefits of sentinel nodes including a higher detection rate for  metastasis due to ultrastaging and potential reduction in operative morbidity. However, there remains uncertainty as to the role for treatment of micrometastatic disease. Further, the benefit of operative morbidity associated with the SLN technique in endometrial cancer is not yet completely known. In other patient populations (e.g. the cervical cancer population) there has been observed reductions in morbidity with SLN biopsy compared to pelvic lymphadenectomy. Lymphedema, nerve dysfunction and lymphocysts are all potential risks with the SLN technique as with complete lymphadenectomy. Additional risks to the patient include the risk of damage to an internal organ while operating in an altered view (e.g. the black and white image of the robotic fluorescence imaging mode).   We did review possible risk of not tolerating trendelenburg position in the setting of morbid obesity (BMI 45). In this case I would recommend a D&C with IUD placement for treatment of EIN. Could optimize and revisit if appropriate candidate for hysterectomy in the future.  Patient was consented for: Robotic assisted total laparoscopic hysterectomy, bilateral salpingectomy, sentinel lymph node evaluation and biopsy, possible dilation and curettage and intrauterine device insertion on 01/02/24.  The risks of surgery were discussed in detail and she understands these to including but not limited to bleeding requiring a blood transfusion, infection, injury to adjacent organs (including but not limited to the bowels, bladder, ureters, nerves, blood vessels), thromboembolic events, wound separation, hernia, vaginal cuff separation, possible risk of lymphedema and lymphocyst if lymphadenectomy performed, unforseen complication, and possible need for re-exploration.  If the patient experiences any of these events, she understands that her hospitalization or recovery may be prolonged and that she may need to take additional medications for a  prolonged period. The patient will receive DVT  and antibiotic prophylaxis as indicated. She voiced a clear understanding. She had the opportunity to ask questions and informed consent was obtained today. She wishes to proceed.  Patient currently taking aygestin 5mg  daily but having ongoing vaginal bleeding. Can increase to 5mg  TID to bridge to surgery. Additional tabs sent to pharmacy. If having ongoing concerns for heavy bleeding, pt instructed to notify us and we could consider switching to megace.   She does not require preoperative clearance. Her METs are >4.  All preoperative instructions were reviewed. Postoperative expectations were also reviewed. Written handouts were provided to the patient.   A copy of this note was sent to the patient's referring provider.  Clide Cliff, MD Gynecologic Oncology   Medical Decision Making I personally spent  TOTAL 60 minutes face-to-face and non-face-to-face in the care of this patient, which includes all pre, intra, and post visit time on the date of service.    ------------  CC: EIN  HISTORY OF PRESENT ILLNESS:  Connie Sims is a 42 y.o. woman who is seen in consultation at the request of Lorriane Shire, MD for evaluation of EIN.  Patient presented to the OB/GYN on 10/11/2023 in the setting of abnormal uterine bleeding.  For the past year she reported monthly periods lasting 6 to 10 days.  Previously her menstrual cycles were more irregular with PCOS, about every 3 months and lasting about 3 days.  She was recommended to undergo a pelvic ultrasound and endometrial biopsy prior to proceeding with endometrial ablation.  On 10/16/2023 her ultrasound showed homogenous endometrium measuring 1.4 cm and a 3.3 cm right ovarian cyst, simple in appearance.  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  Today patient reports that her bleeding has mostly stopped  from her recent cycle that lasted about 2 weeks long.  She otherwise denies abdominal bloating, early satiety, significant weight loss, change in bowel or bladder habits.   Patient does report that the family history on her paternal side is unknown.  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   PAST MEDICAL HISTORY: Past Medical History:  Diagnosis Date   Allergic rhinitis    Allergic rhinitis with postnasal drip    Congenital osteopetrosis    previously managed by specialist at [facility], periodic hearing tests   Diabetes mellitus without complication (HCC)    Epigastric pain    Fibroids    Foot fracture, right    Jones fx 5th metatarsal   Impaired fasting glucose    Insomnia    Low back pain    LUQ pain    Microcytosis    Morbid obesity with BMI of 40.0-44.9, adult (HCC)    PCOS (polycystic ovarian syndrome)     PAST SURGICAL HISTORY: Past Surgical History:  Procedure Laterality Date   NASAL SINUS SURGERY      OB/GYN HISTORY: OB History  Gravida Para Term Preterm AB Living  0            SAB IAB Ectopic Multiple Live Births             Obstetric Comments  1st Menstrual Cycle:  11        Age at menarche: 54 Age at menopause: n/a Hx of HRT: norethindrone, other OCPs in the past Hx of STI: no Last pap: 03/07/22 NILM, HPV HR neg History of abnormal pap smears: no  SCREENING STUDIES:  Last  mammogram: 09/2023 Last colonoscopy: n/a  MEDICATIONS:  Current Outpatient Medications:    cyclobenzaprine (FLEXERIL) 10 MG tablet, Take 1 tablet (10 mg total) by mouth 3 (three) times daily as needed for muscle spasms., Disp: 90 tablet, Rfl: 0   Daridorexant HCl (QUVIVIQ) 25 MG TABS, Take 1 tablet (25 mg total) by mouth at bedtime., Disp: 30 tablet, Rfl: 3   Iron, Ferrous Sulfate, 325 (65 Fe) MG TABS, Take 325 mg by mouth 3 (three) times daily., Disp: 100 tablet, Rfl: 1   norethindrone (AYGESTIN) 5 MG tablet, Take 1 tablet (5  mg total) by mouth daily., Disp: 30 tablet, Rfl: 2   norethindrone (AYGESTIN) 5 MG tablet, Take 1 tablet (5 mg total) by mouth in the morning, at noon, and at bedtime., Disp: 90 tablet, Rfl: 1   pantoprazole (PROTONIX) 40 MG tablet, Take 1 tablet (40 mg total) by mouth daily before breakfast., Disp: 90 tablet, Rfl: 1   pramipexole (MIRAPEX) 0.5 MG tablet, Take 1 tablet (0.5 mg total) by mouth at bedtime., Disp: 60 tablet, Rfl: 0   tirzepatide (ZEPBOUND) 2.5 MG/0.5ML Pen, Inject 2.5 mg into the skin once a week., Disp: 2 mL, Rfl: 0   [START ON 12/15/2023] tirzepatide (ZEPBOUND) 5 MG/0.5ML Pen, Inject 5 mg into the skin once a week., Disp: 2 mL, Rfl: 0  ALLERGIES: No Known Allergies  FAMILY HISTORY: Family History  Problem Relation Age of Onset   Liver disease Mother        fatty liver, s/p liver biopsy   Alcohol abuse Mother    Uterine cancer Maternal Aunt    Hypertension Maternal Grandmother    Glaucoma Maternal Grandmother    Diabetes Maternal Grandfather    Heart disease Maternal Grandfather    Breast cancer Neg Hx    Ovarian cancer Neg Hx    Colon cancer Neg Hx    Pancreatic cancer Neg Hx    Prostate cancer Neg Hx     SOCIAL HISTORY: Social History   Socioeconomic History   Marital status: Married    Spouse name: Not on file   Number of children: 0   Years of education: Masters   Highest education level: Not on file  Occupational History   Occupation: Art gallery manager   Tobacco Use   Smoking status: Never   Smokeless tobacco: Never  Vaping Use   Vaping status: Never Used  Substance and Sexual Activity   Alcohol use: No    Alcohol/week: 0.0 standard drinks of alcohol   Drug use: No   Sexual activity: Not Currently    Birth control/protection: Condom  Other Topics Concern   Not on file  Social History Narrative   Drinks 2- 20oz pepsi a day   Works for [employer] as an Art gallery manager    She was at [employer] band   She plays the saxophone    Dating someone from [place]     Social Determinants of Health   Financial Resource Strain: Unknown (07/02/2021)   Overall Financial Resource Strain (CARDIA)    Difficulty of Paying Living Expenses: Patient declined  Food Insecurity: Unknown (07/02/2021)   Hunger Vital Sign    Worried About Running Out of Food in the Last Year: Patient declined    Ran Out of Food in the Last Year: Patient declined  Transportation Needs: No Transportation Needs (11/15/2023)   PRAPARE - Administrator, Civil Service (Medical): No    Lack of Transportation (Non-Medical): No  Physical Activity: Inactive (07/02/2021)  Exercise Vital Sign    Days of Exercise per Week: 0 days    Minutes of Exercise per Session: 0 min  Stress: No Stress Concern Present (07/02/2021)   [employer] of Occupational Health - Occupational Stress Questionnaire    Feeling of Stress : Not at all  Social Connections: Moderately Isolated (07/02/2021)   Social Connection and Isolation Panel [NHANES]    Frequency of Communication with Friends and Family: More than three times a week    Frequency of Social Gatherings with Friends and Family: More than three times a week    Attends Religious Services: Never    Database administrator or Organizations: No    Attends Banker Meetings: Never    Marital Status: Married  Catering manager Violence: Not At Risk (07/02/2021)   Humiliation, Afraid, Rape, and Kick questionnaire    Fear of Current or Ex-Partner: No    Emotionally Abused: No    Physically Abused: No    Sexually Abused: No    REVIEW OF SYSTEMS: New patient intake form was reviewed.  Complete 10-system review is negative except for the following: Menstrual problems, muscle cramp, leg swelling  PHYSICAL EXAM: BP 125/84 (BP Location: Right Arm, Patient Position: Sitting)   Pulse 76   Temp 98.1 F (36.7 C) (Oral)   Resp 20   Ht 5\' 5"  (1.651 m)   Wt 271 lb (122.9 kg)   SpO2 100%   BMI 45.10 kg/m  Constitutional: No acute  distress. Neuro/Psych: Alert, oriented.  Head and Neck: Normocephalic, atraumatic. Neck symmetric without masses. Sclera anicteric.  Respiratory: Normal work of breathing. Clear to auscultation bilaterally. Cardiovascular: Regular rate and rhythm, no murmurs, rubs, or gallops. Abdomen: Normoactive bowel sounds. Soft, non-distended, non-tender to palpation. No masses appreciated.  Extremities: Grossly normal range of motion. Warm, well perfused. No edema bilaterally. Skin: No rashes or lesions. Lymphatic: No cervical, supraclavicular, or inguinal adenopathy. Genitourinary: External genitalia without lesions. Urethral meatus without lesions or prolapse. On speculum exam, vagina and cervix without lesions. Small dark blood in vaginal vault. Bimanual exam reveals normal cervix. Palpation of uterus and adnexa limited by body habitus. Exam chaperoned by Warner Mccreedy, NP   LABORATORY AND RADIOLOGIC DATA: Outside medical records were reviewed to synthesize the above history, along with the history and physical obtained during the visit.  Outside laboratory, pathology, and imaging reports were reviewed, with pertinent results below.  I personally reviewed the outside images.  WBC  Date Value Ref Range Status  05/31/2023 8.7 3.8 - 10.8 Thousand/uL Final   Hemoglobin  Date Value Ref Range Status  05/31/2023 10.2 (L) 11.7 - 15.5 g/dL Final  88/41/6606 30.1 11.1 - 15.9 g/dL Final   HCT  Date Value Ref Range Status  05/31/2023 32.4 (L) 35.0 - 45.0 % Final   Hematocrit  Date Value Ref Range Status  02/01/2016 37.3 34.0 - 46.6 % Final   Platelets  Date Value Ref Range Status  05/31/2023 284 140 - 400 Thousand/uL Final  02/01/2016 297 150 - 379 x10E3/uL Final   Magnesium  Date Value Ref Range Status  06/08/2017 2.2 1.5 - 2.5 mg/dL Final   Creat  Date Value Ref Range Status  10/28/2022 0.67 0.50 - 0.99 mg/dL Final   AST  Date Value Ref Range Status  10/28/2022 15 10 - 30 U/L Final    ALT  Date Value Ref Range Status  10/28/2022 26 6 - 29 U/L Final   Diagnosis  Date Value Ref Range  Status  03/07/2022   Final   - Negative for intraepithelial lesion or malignancy (NILM)    Surgical pathology (10/19/23): A. 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       Negative for carcinoma.   US PELVIC COMPLETE WITH TRANSVAGINAL 10/16/2023  Narrative CLINICAL DATA:  abnormal uterine bleeding  EXAM: ULTRASOUND OF PELVIS  TECHNIQUE: Transabdominal and transvaginalultrasound examination of the pelvis was performed including evaluation of the uterus, ovaries, adnexal regions, and pelvic cul-de-sac.  COMPARISON:  06/15/2018.  FINDINGS: Uterusanteverted, 7 x 5 x 4 cm. The endometrium homogeneous, 1.4 cm. The uterine cavity is empty. There are no uterine masses.  Right ovary  Simple cyst measures 3.3 x 3.3 x 3.0 cm. Ovary measurements 4.7 x 4.2 x 3.4 cm.  Left ovary  Unremarkable, 4.4 x 3.1 x 2.2 cm.  Images of the adnexae demonstrated no masses or fluid collections. Color Doppler demonstrated ovarian blood flow.  IMPRESSION: 3.3 cm right ovarian cyst. Otherwise unremarkable examination of the pelvis.  .   Electronically Signed By: Layla Maw M.D. On: 11/09/2023 10:27

## 2023-11-22 NOTE — Progress Notes (Signed)
Patient here for new patient consultation with Dr. Alvester Morin and for a pre-operative appointment prior to her scheduled surgery on 01/02/2024. She is scheduled for robotic assisted total laparoscopic hysterectomy, bilateral salpingectomy, sentinel lymph node biopsy, possible dilation and curettage of the uterus with Mirena IUD placement if unable to tolerate positioning for robotic surgery. The surgery was discussed in detail.  See after visit summary for additional details.    Discussed post-op pain management in detail including the aspects of the enhanced recovery pathway.  Advised her that a new prescription would be sent in for tramadol and it is only to be used for after her upcoming surgery.  We discussed the use of tylenol post-op and to monitor for a maximum of 4,000 mg in a 24 hour period.  Also prescribed sennakot to be used after surgery and to hold if having loose stools.  Discussed bowel regimen in detail.     Discussed measures to take at home to prevent DVT including frequent mobility.  Reportable signs and symptoms of DVT discussed. Post-operative instructions discussed and expectations for after surgery. Incisional care discussed as well including reportable signs and symptoms including erythema, drainage, wound separation.     10 minutes spent preparing inform\ation and with the patient.  Verbalizing understanding of material discussed. No needs or concerns voiced at the end of the visit.   Advised patient to call for any needs.  Advised that her post-operative medications had been prescribed and could be picked up at any time.    This appointment is included in the global surgical bundle as pre-operative teaching and has no charge.

## 2023-11-22 NOTE — Patient Instructions (Signed)
Preparing for your Surgery   Plan for surgery on January 02, 2024 with Dr. Clide Cliff at [facility]. You will be scheduled for robotic assisted total laparoscopic hysterectomy (removal of the uterus and cervix), bilateral salpingectomy (removal of both fallopian tubes), sentinel lymph node biopsy, possible dilation and curettage of the uterus with Mirena IUD placement if unable to tolerate positioning for robotic surgery.    Pre-operative Testing -You will receive a phone call from presurgical testing at [facility] to arrange for a pre-operative appointment and lab work.   -Bring your insurance card, copy of an advanced directive if applicable, medication list   -At that visit, you will be asked to sign a consent for a possible blood transfusion in case a transfusion becomes necessary during surgery.  The need for a blood transfusion is rare but having consent is a necessary part of your care.      -You should not be taking blood thinners or aspirin at least ten days prior to surgery unless instructed by your surgeon.   -Do not take supplements such as fish oil (omega 3), red yeast rice, turmeric before your surgery. You want to avoid medications with aspirin in them including headache powders such as BC or Goody's), Excedrin migraine.   Day Before Surgery at Home -You will be asked to take in a light diet the day before surgery. You will be advised you can have clear liquids up until 3 hours before your surgery.     Eat a light diet the day before surgery.  Examples including soups, broths, toast, yogurt, mashed potatoes.  AVOID GAS PRODUCING FOODS AND BEVERAGES. Things to avoid include carbonated beverages (fizzy beverages, sodas), raw fruits and raw vegetables (uncooked), or beans.    If your bowels are filled with gas, your surgeon will have difficulty visualizing your pelvic organs which increases your surgical risks.   Your role in recovery Your role is to become  active as soon as directed by your doctor, while still giving yourself time to heal.  Rest when you feel tired. You will be asked to do the following in order to speed your recovery:   - Cough and breathe deeply. This helps to clear and expand your lungs and can prevent pneumonia after surgery.  - STAY ACTIVE WHEN YOU GET HOME. Do mild physical activity. Walking or moving your legs help your circulation and body functions return to normal. Do not try to get up or walk alone the first time after surgery.   -If you develop swelling on one leg or the other, pain in the back of your leg, redness/warmth in one of your legs, please call the office or go to the Emergency Room to have a doppler to rule out a blood clot. For shortness of breath, chest pain-seek care in the Emergency Room as soon as possible. - Actively manage your pain. Managing your pain lets you move in comfort. We will ask you to rate your pain on a scale of zero to 10. It is your responsibility to tell your doctor or nurse where and how much you hurt so your pain can be treated.   Special Considerations -If you are diabetic, you may be placed on insulin after surgery to have closer control over your blood sugars to promote healing and recovery.  This does not mean that you will be discharged on insulin.  If applicable, your oral antidiabetics will be resumed when you are tolerating a solid diet.   -  Your final pathology results from surgery should be available around one week after surgery and the results will be relayed to you when available.   -Dr. Antionette Char is the surgeon that assists your GYN Oncologist with surgery.  If you end up staying the night, the next day after your surgery you will either see Dr. Pricilla Holm, Dr. Alvester Morin, or Dr. Antionette Char.   -FMLA forms can be faxed to [phone] and please allow 5-7 business days for completion.   Pain Management After Surgery -You will be prescribed your pain medication and  bowel regimen medications before surgery so that you can have these available when you are discharged from the hospital. The pain medication is for use ONLY AFTER surgery and a new prescription will not be given.    -Make sure that you have Tylenol and Ibuprofen IF YOU ARE ABLE TO TAKE THESE MEDICATIONS at home to use on a regular basis after surgery for pain control. We recommend alternating the medications every hour to six hours since they work differently and are processed in the body differently for pain relief.   -Review the attached handout on narcotic use and their risks and side effects.    Bowel Regimen -You will be prescribed Sennakot-S to take nightly to prevent constipation especially if you are taking the narcotic pain medication intermittently.  It is important to prevent constipation and drink adequate amounts of liquids. You can stop taking this medication when you are not taking pain medication and you are back on your normal bowel routine.   Risks of Surgery Risks of surgery are low but include bleeding, infection, damage to surrounding structures, re-operation, blood clots, and very rarely death.     Blood Transfusion Information (For the consent to be signed before surgery)   We will be checking your blood type before surgery so in case of emergencies, we will know what type of blood you would need.                                             WHAT IS A BLOOD TRANSFUSION?   A transfusion is the replacement of blood or some of its parts. Blood is made up of multiple cells which provide different functions. Red blood cells carry oxygen and are used for blood loss replacement. White blood cells fight against infection. Platelets control bleeding. Plasma helps clot blood. Other blood products are available for specialized needs, such as hemophilia or other clotting disorders. BEFORE THE TRANSFUSION  Who gives blood for transfusions?  You may be able to donate blood to be  used at a later date on yourself (autologous donation). Relatives can be asked to donate blood. This is generally not any safer than if you have received blood from a stranger. The same precautions are taken to ensure safety when a relative's blood is donated. Healthy volunteers who are fully evaluated to make sure their blood is safe. This is blood bank blood. Transfusion therapy is the safest it has ever been in the practice of medicine. Before blood is taken from a donor, a complete history is taken to make sure that person has no history of diseases nor engages in risky social behavior (examples are intravenous drug use or sexual activity with multiple partners). The donor's travel history is screened to minimize risk of transmitting infections, such as malaria. The donated blood is  tested for signs of infectious diseases, such as HIV and hepatitis. The blood is then tested to be sure it is compatible with you in order to minimize the chance of a transfusion reaction. If you or a relative donates blood, this is often done in anticipation of surgery and is not appropriate for emergency situations. It takes many days to process the donated blood. RISKS AND COMPLICATIONS Although transfusion therapy is very safe and saves many lives, the main dangers of transfusion include:  Getting an infectious disease. Developing a transfusion reaction. This is an allergic reaction to something in the blood you were given. Every precaution is taken to prevent this. The decision to have a blood transfusion has been considered carefully by your caregiver before blood is given. Blood is not given unless the benefits outweigh the risks.   AFTER SURGERY INSTRUCTIONS   Return to work: 4-6 weeks if applicable   Activity: 1. Be up and out of the bed during the day.  Take a nap if needed.  You may walk up steps but be careful and use the hand rail.  Stair climbing will tire you more than you think, you may need to stop  part way and rest.    2. No lifting or straining for 6 weeks over 10 pounds. No pushing, pulling, straining for 6 weeks.   3. No driving for around 4-40 days when the following criteria have been met.  Do not drive if you are taking narcotic pain medicine and make sure that your reaction time has returned.    4. You can shower as soon as the next day after surgery. Shower daily.  Use your regular soap and water (not directly on the incision) and pat your incision(s) dry afterwards; don't rub.  No tub baths or submerging your body in water until cleared by your surgeon. If you have the soap that was given to you by pre-surgical testing that was used before surgery, you do not need to use it afterwards because this can irritate your incisions.    5. No sexual activity and nothing in the vagina for 12 weeks.   6. You may experience a small amount of clear drainage from your incisions, which is normal.  If the drainage persists, increases, or changes color please call the office.   7. Do not use creams, lotions, or ointments such as neosporin on your incisions after surgery until advised by your surgeon because they can cause removal of the dermabond glue on your incisions.     8. You may experience vaginal spotting after surgery or when the stitches at the top of the vagina begin to dissolve.  The spotting is normal but if you experience heavy bleeding, call our office.   9. Take Tylenol or ibuprofen first for pain if you are able to take these medications and only use narcotic pain medication for severe pain not relieved by the Tylenol or Ibuprofen.  Monitor your Tylenol intake to a max of 4,000 mg in a 24 hour period. You can alternate these medications after surgery.   Diet: 1. Low sodium Heart Healthy Diet is recommended but you are cleared to resume your normal (before surgery) diet after your procedure.   2. It is safe to use a laxative, such as Miralax or Colace, if you have difficulty  moving your bowels. You have been prescribed Sennakot-S to take at bedtime every evening after surgery to keep bowel movements regular and to prevent constipation.  Wound Care: 1. Keep clean and dry.  Shower daily.   Reasons to call the Doctor: Fever - Oral temperature greater than 100.4 degrees Fahrenheit Foul-smelling vaginal discharge Difficulty urinating Nausea and vomiting Increased pain at the site of the incision that is unrelieved with pain medicine. Difficulty breathing with or without chest pain New calf pain especially if only on one side Sudden, continuing increased vaginal bleeding with or without clots.   Contacts: For questions or concerns you should contact:   Dr. Clide Cliff at [phone]   Warner Mccreedy, NP at [phone]   After Hours: call [phone] and have the GYN Oncologist paged/contacted (after 5 pm or on the weekends). You will speak with an after hours RN and let he or she know you have had surgery.   Messages sent via mychart are for non-urgent matters and are not responded to after hours so for urgent needs, please call the after hours number.

## 2023-12-17 ENCOUNTER — Encounter: Payer: Self-pay | Admitting: Psychiatry

## 2023-12-21 ENCOUNTER — Other Ambulatory Visit: Payer: Self-pay | Admitting: Physician Assistant

## 2023-12-21 DIAGNOSIS — G4761 Periodic limb movement disorder: Secondary | ICD-10-CM

## 2023-12-21 NOTE — Progress Notes (Addendum)
COVID Vaccine received:  []  No [x]  Yes Date of any COVID positive Test in last 90 days: no PCP - Danelle Berry PA-C Cardiologist - none  Chest x-ray -  EKG -   Stress Test -  ECHO -  Cardiac Cath -   Bowel Prep - [x]  No  []   Yes ______  Pacemaker / ICD device [x]  No []  Yes   Spinal Cord Stimulator:[x]  No []  Yes       History of Sleep Apnea? [x]  No []  Yes   CPAP used?- [x]  No []  Yes    Does the patient monitor blood sugar?          [x]  No []  Yes  []  N/A  Patient has: [x]  NO Hx DM   []  Pre-DM                 []  DM1  []   DM2 Does patient have a [employer] or Dexacom? []  No []  Yes   Fasting Blood Sugar Ranges-  Checks Blood Sugar _____ times a day  GLP1 agonist / usual dose - Zepbound- hold x 7 days. Last dose 12/23/23 GLP1 instructions:  SGLT-2 inhibitors / usual dose -  SGLT-2 instructions:   Blood Thinner / Instructions:no Aspirin Instructions:no  Comments:   Activity level: Patient is able  to climb a flight of stairs without difficulty; [x]  No CP  [x]  No SOB, ___   Patient can  perform ADLs without assistance.   Anesthesia review:   Patient denies shortness of breath, fever, cough and chest pain at PAT appointment.  Patient verbalized understanding and agreement to the Pre-Surgical Instructions that were given to them at this PAT appointment. Patient was also educated of the need to review these PAT instructions again prior to his/her surgery.I reviewed the appropriate phone numbers to call if they have any and questions or concerns.

## 2023-12-21 NOTE — Patient Instructions (Signed)
SURGICAL WAITING ROOM VISITATION  Patients having surgery or a procedure may have no more than 2 support people in the waiting area - these visitors may rotate.    Children under the age of 61 must have an adult with them who is not the patient.  Due to an increase in RSV and influenza rates and associated hospitalizations, children ages 2 and under may not visit patients in [facility] hospitals.  If the patient needs to stay at the hospital during part of their recovery, the visitor guidelines for inpatient rooms apply. Pre-op nurse will coordinate an appropriate time for 1 support person to accompany patient in pre-op.  This support person may not rotate.    Please refer to the [facility] website for the visitor guidelines for Inpatients (after your surgery is over and you are in a regular room).       Your procedure is scheduled on: 01/02/24   Report to [facility] Main Entrance    Report to admitting at 5:15 AM   Call this number if you have problems the morning of surgery [phone]   Do not eat food :After Midnight.   After Midnight you may have the following liquids until  4:30 AM DAY OF SURGERY  Water Non-Citrus Juices (without pulp, NO RED-Apple, White grape, White cranberry) Black Coffee (NO MILK/CREAM OR CREAMERS, sugar ok)  Clear Tea (NO MILK/CREAM OR CREAMERS, sugar ok) regular and decaf                             Plain Jell-O (NO RED)                                           Fruit ices (not with fruit pulp, NO RED)                                     Popsicles (NO RED)                                                               Sports drinks like Gatorade (NO RED)               Light diet the day prior to surgery. Avoid gas causing foods and drinks like beans, cabbage, soda.   Oral Hygiene is also important to reduce your risk of infection.                                    Remember - BRUSH YOUR TEETH THE MORNING OF SURGERY WITH YOUR REGULAR  TOOTHPASTE   Stop all vitamins and herbal supplements 7 days before surgery.   Take these medicines the morning of surgery with A SIP OF WATER: Norethindrone, Pantoprazole.  DO NOT TAKE ANY ORAL DIABETIC MEDICATIONS DAY OF YOUR SURGERY HOLD ZEPBOUND(TIRZEPatide) at least 7 days prior to surgery.  Bring CPAP mask and tubing day of surgery.  You may not have any metal on your body including hair pins, jewelry, and body piercing             Do not wear make-up, lotions, powders, perfumes/cologne, or deodorant  Do not wear nail polish including gel and S&S, artificial/acrylic nails, or any other type of covering on natural nails including finger and toenails. If you have artificial nails, gel coating, etc. that needs to be removed by a nail salon please have this removed prior to surgery or surgery may need to be canceled/ delayed if the surgeon/ anesthesia feels like they are unable to be safely monitored.   Do not shave  48 hours prior to surgery.               Men may shave face and neck.   Do not bring valuables to the hospital. [place] IS NOT             RESPONSIBLE   FOR VALUABLES.   Contacts, glasses, dentures or bridgework may not be worn into surgery.  DO NOT BRING YOUR HOME MEDICATIONS TO THE HOSPITAL. PHARMACY WILL DISPENSE MEDICATIONS LISTED ON YOUR MEDICATION LIST TO YOU DURING YOUR ADMISSION IN THE HOSPITAL!    Patients discharged on the day of surgery will not be allowed to drive home.  Someone NEEDS to stay with you for the first 24 hours after anesthesia.   Special Instructions: Bring a copy of your healthcare power of attorney and living will documents the day of surgery if you haven't scanned them before.              Please read over the following fact sheets you were given: IF YOU HAVE QUESTIONS ABOUT YOUR PRE-OP INSTRUCTIONS PLEASE CALL [phone] Rosey Bath   If you received a COVID test during your pre-op visit  it is requested  that you wear a mask when out in public, stay away from anyone that may not be feeling well and notify your surgeon if you develop symptoms. If you test positive for Covid or have been in contact with anyone that has tested positive in the last 10 days please notify you surgeon.    [place] - Preparing for Surgery Before surgery, you can play an important role.  Because skin is not sterile, your skin needs to be as free of germs as possible.  You can reduce the number of germs on your skin by washing with CHG (chlorahexidine gluconate) soap before surgery.  CHG is an antiseptic cleaner which kills germs and bonds with the skin to continue killing germs even after washing. Please DO NOT use if you have an allergy to CHG or antibacterial soaps.  If your skin becomes reddened/irritated stop using the CHG and inform your nurse when you arrive at Short Stay. Do not shave (including legs and underarms) for at least 48 hours prior to the first CHG shower.  You may shave your face/neck.  Please follow these instructions carefully:  1.  Shower with CHG Soap the night before surgery and the  morning of surgery.  2.  If you choose to wash your hair, wash your hair first as usual with your normal  shampoo.  3.  After you shampoo, rinse your hair and body thoroughly to remove the shampoo.                             4.  Use CHG as you would any  other liquid soap.  You can apply chg directly to the skin and wash.  Gently with a scrungie or clean washcloth.  5.  Apply the CHG Soap to your body ONLY FROM THE NECK DOWN.   Do   not use on face/ open                           Wound or open sores. Avoid contact with eyes, ears mouth and   genitals (private parts).                       Wash face,  Genitals (private parts) with your normal soap.             6.  Wash thoroughly, paying special attention to the area where your    surgery  will be performed.  7.  Thoroughly rinse your body with warm water from the neck  down.  8.  DO NOT shower/wash with your normal soap after using and rinsing off the CHG Soap.                9.  Pat yourself dry with a clean towel.            10.  Wear clean pajamas.            11.  Place clean sheets on your bed the night of your first shower and do not  sleep with pets. Day of Surgery : Do not apply any lotions/deodorants the morning of surgery.  Please wear clean clothes to the hospital/surgery center.  FAILURE TO FOLLOW THESE INSTRUCTIONS MAY RESULT IN THE CANCELLATION OF YOUR SURGERY  PATIENT SIGNATURE_________________________________  NURSE SIGNATURE__________________________________  ________________________________________________________________________ WHAT IS A BLOOD TRANSFUSION? Blood Transfusion Information  A transfusion is the replacement of blood or some of its parts. Blood is made up of multiple cells which provide different functions. Red blood cells carry oxygen and are used for blood loss replacement. White blood cells fight against infection. Platelets control bleeding. Plasma helps clot blood. Other blood products are available for specialized needs, such as hemophilia or other clotting disorders. BEFORE THE TRANSFUSION  Who gives blood for transfusions?  Healthy volunteers who are fully evaluated to make sure their blood is safe. This is blood bank blood. Transfusion therapy is the safest it has ever been in the practice of medicine. Before blood is taken from a donor, a complete history is taken to make sure that person has no history of diseases nor engages in risky social behavior (examples are intravenous drug use or sexual activity with multiple partners). The donor's travel history is screened to minimize risk of transmitting infections, such as malaria. The donated blood is tested for signs of infectious diseases, such as HIV and hepatitis. The blood is then tested to be sure it is compatible with you in order to minimize the chance of a  transfusion reaction. If you or a relative donates blood, this is often done in anticipation of surgery and is not appropriate for emergency situations. It takes many days to process the donated blood. RISKS AND COMPLICATIONS Although transfusion therapy is very safe and saves many lives, the main dangers of transfusion include:  Getting an infectious disease. Developing a transfusion reaction. This is an allergic reaction to something in the blood you were given. Every precaution is taken to prevent this. The decision to have a blood transfusion has been considered carefully by  your caregiver before blood is given. Blood is not given unless the benefits outweigh the risks. AFTER THE TRANSFUSION Right after receiving a blood transfusion, you will usually feel much better and more energetic. This is especially true if your red blood cells have gotten low (anemic). The transfusion raises the level of the red blood cells which carry oxygen, and this usually causes an energy increase. The nurse administering the transfusion will monitor you carefully for complications. HOME CARE INSTRUCTIONS  No special instructions are needed after a transfusion. You may find your energy is better. Speak with your caregiver about any limitations on activity for underlying diseases you may have. SEEK MEDICAL CARE IF:  Your condition is not improving after your transfusion. You develop redness or irritation at the intravenous (IV) site. SEEK IMMEDIATE MEDICAL CARE IF:  Any of the following symptoms occur over the next 12 hours: Shaking chills. You have a temperature by mouth above 102 F (38.9 C), not controlled by medicine. Chest, back, or muscle pain. People around you feel you are not acting correctly or are confused. Shortness of breath or difficulty breathing. Dizziness and fainting. You get a rash or develop hives. You have a decrease in urine output. Your urine turns a dark color or changes to pink, red,  or brown. Any of the following symptoms occur over the next 10 days: You have a temperature by mouth above 102 F (38.9 C), not controlled by medicine. Shortness of breath. Weakness after normal activity. The white part of the eye turns yellow (jaundice). You have a decrease in the amount of urine or are urinating less often. Your urine turns a dark color or changes to pink, red, or brown. Document Released: 12/09/2000 Document Revised: 03/05/2012 Document Reviewed: 07/28/2008 [facility] Patient Information 2014 [place], [place].

## 2023-12-23 ENCOUNTER — Other Ambulatory Visit: Payer: Self-pay | Admitting: Physician Assistant

## 2023-12-23 ENCOUNTER — Other Ambulatory Visit: Payer: Self-pay | Admitting: Family Medicine

## 2023-12-23 DIAGNOSIS — D500 Iron deficiency anemia secondary to blood loss (chronic): Secondary | ICD-10-CM

## 2023-12-23 DIAGNOSIS — K219 Gastro-esophageal reflux disease without esophagitis: Secondary | ICD-10-CM

## 2023-12-23 DIAGNOSIS — G8929 Other chronic pain: Secondary | ICD-10-CM

## 2023-12-25 ENCOUNTER — Other Ambulatory Visit: Payer: Self-pay

## 2023-12-25 ENCOUNTER — Other Ambulatory Visit: Payer: Self-pay | Admitting: Psychiatry

## 2023-12-25 ENCOUNTER — Encounter (HOSPITAL_COMMUNITY)
Admission: RE | Admit: 2023-12-25 | Discharge: 2023-12-25 | Disposition: A | Discharge status: Home / Self Care | Payer: 59 | Source: Ambulatory Visit | Attending: Psychiatry | Admitting: Psychiatry

## 2023-12-25 ENCOUNTER — Encounter (HOSPITAL_COMMUNITY): Payer: Self-pay

## 2023-12-25 VITALS — BP 146/89 | HR 85 | Temp 98.0°F | Resp 16 | Ht 65.0 in | Wt 272.0 lb

## 2023-12-25 DIAGNOSIS — N8502 Endometrial intraepithelial neoplasia [EIN]: Secondary | ICD-10-CM | POA: Insufficient documentation

## 2023-12-25 DIAGNOSIS — E119 Type 2 diabetes mellitus without complications: Secondary | ICD-10-CM | POA: Insufficient documentation

## 2023-12-25 DIAGNOSIS — Z01812 Encounter for preprocedural laboratory examination: Secondary | ICD-10-CM | POA: Diagnosis present

## 2023-12-25 HISTORY — DX: Headache, unspecified: R51.9

## 2023-12-25 LAB — CBC
HCT: 41.4 % (ref 36.0–46.0)
Hemoglobin: 12.7 g/dL (ref 12.0–15.0)
MCH: 25.5 pg — ABNORMAL LOW (ref 26.0–34.0)
MCHC: 30.7 g/dL (ref 30.0–36.0)
MCV: 83.1 fL (ref 80.0–100.0)
Platelets: 376 10*3/uL (ref 150–400)
RBC: 4.98 MIL/uL (ref 3.87–5.11)
RDW: 16.4 % — ABNORMAL HIGH (ref 11.5–15.5)
WBC: 10.1 10*3/uL (ref 4.0–10.5)
nRBC: 0 % (ref 0.0–0.2)

## 2023-12-25 LAB — COMPREHENSIVE METABOLIC PANEL
ALT: 47 U/L — ABNORMAL HIGH (ref 0–44)
AST: 24 U/L (ref 15–41)
Albumin: 3.9 g/dL (ref 3.5–5.0)
Alkaline Phosphatase: 71 U/L (ref 38–126)
Anion gap: 10 (ref 5–15)
BUN: 13 mg/dL (ref 6–20)
CO2: 23 mmol/L (ref 22–32)
Calcium: 9.3 mg/dL (ref 8.9–10.3)
Chloride: 104 mmol/L (ref 98–111)
Creatinine, Ser: 0.76 mg/dL (ref 0.44–1.00)
GFR, Estimated: >60 mL/min (ref >60)
Glucose, Bld: 94 mg/dL (ref 70–99)
Potassium: 4.2 mmol/L (ref 3.5–5.1)
Sodium: 137 mmol/L (ref 135–145)
Total Bilirubin: 0.4 mg/dL (ref 0.0–1.2)
Total Protein: 7.7 g/dL (ref 6.5–8.1)

## 2023-12-25 MED ORDER — NORETHINDRONE ACETATE 5 MG PO TABS: 5.0000 mg | ORAL_TABLET | Freq: Three times a day (TID) | ORAL | 1 refills | Status: DC

## 2023-12-26 LAB — HEMOGLOBIN A1C
Hgb A1c MFr Bld: 6.1 % — ABNORMAL HIGH (ref 4.8–5.6)
Mean Plasma Glucose: 128 mg/dL

## 2023-12-26 NOTE — Telephone Encounter (Signed)
 Requested medication (s) are due for refill today: No  Requested medication (s) are on the active medication list: Yes  Last refill:  10/30/23, #60, 0RF  Future visit scheduled: No  Notes to clinic:  Unable to refill per protocol due to diagnosis code needed. Pharmacy requesting 90-day supply     Requested Prescriptions  Pending Prescriptions Disp Refills   pramipexole  (MIRAPEX ) 0.5 MG tablet [Pharmacy Med Name: PRAMIPEXOLE  0.5 MG TABLET] 90 tablet 1    Sig: TAKE 1 TABLET BY MOUTH AT BEDTIME.     Neurology:  Parkinsonian Agents Failed - 12/26/2023 10:18 AM      Failed - Last BP in normal range    BP Readings from Last 1 Encounters:  12/25/23 (!) 146/89         Passed - Last Heart Rate in normal range    Pulse Readings from Last 1 Encounters:  12/25/23 85         Passed - Valid encounter within last 12 months    Recent Outpatient Visits           2 months ago Class 3 severe obesity due to excess calories with serious comorbidity and body mass index (BMI) of 40.0 to 44.9 in adult [facility])   [place] [facility] Mecum, Rocky BRAVO, PA-C   6 months ago Morbid obesity [facility])   [place] [facility] Glenard Mire, MD   1 year ago Morbid obesity [facility])    [facility] Leavy Mole, PA-C   1 year ago Abnormal uterine bleeding   [facility] Gareth Mliss FALCON, FNP   1 year ago Abnormal uterine bleeding   [facility] Madelon Donald HERO, [place]

## 2023-12-28 NOTE — Telephone Encounter (Signed)
 Requested medication (s) are due for refill today: yes  Requested medication (s) are on the active medication list: yes    Last refill: 10/30/23  #90  0 refills  Future visit scheduled no  Notes to clinic:Not delegated, please review.  Requested Prescriptions  Pending Prescriptions Disp Refills   cyclobenzaprine  (FLEXERIL ) 10 MG tablet [Pharmacy Med Name: CYCLOBENZAPRINE  10 MG TABLET] 90 tablet 0    Sig: TAKE 1 TABLET BY MOUTH THREE TIMES A DAY AS NEEDED FOR MUSCLE SPASMS     Not Delegated - Analgesics:  Muscle Relaxants Failed - 12/28/2023 12:12 PM      Failed - This refill cannot be delegated      Passed - Valid encounter within last 6 months    Recent Outpatient Visits           2 months ago Class 3 severe obesity due to excess calories with serious comorbidity and body mass index (BMI) of 40.0 to 44.9 in adult [facility])   [place] [facility] Mecum, Rocky BRAVO, PA-C   7 months ago Morbid obesity [facility])   [place] [facility] Sowles, Krichna, MD   1 year ago Morbid obesity [facility])   [place] [facility] Leavy Mole, PA-C   1 year ago Abnormal uterine bleeding   [facility] Gareth Mliss FALCON, FNP   1 year ago Abnormal uterine bleeding   [facility] Madelon Donald HERO, [place]

## 2023-12-29 NOTE — Progress Notes (Signed)
 I was informted that this patient currently uses public transportation and may have issues with finding a ride home after surgery on 1/7.  Spoke with Eleanor Epps NP with Dr Eldonna,  she stated she would follow up with the patient to find out if there are transportation troubles and change the patient to stay overnight if needed.

## 2024-01-01 ENCOUNTER — Telehealth: Payer: Self-pay | Admitting: *Deleted

## 2024-01-01 NOTE — Telephone Encounter (Signed)
 Telephone call to check on pre-operative status.  Patient compliant with pre-operative instructions.  Reinforced nothing to eat after midnight. Clear liquids until 0415. Patient to arrive at 0515. Pt states she is having a friend drive her to the hospital in the morning and that same friend will pick her up after surgery and take her home.  No questions or concerns voiced.  Instructed to call for any needs.

## 2024-01-01 NOTE — Anesthesia Preprocedure Evaluation (Addendum)
 Anesthesia Evaluation  Patient identified by MRN, date of birth, ID band Patient awake    Reviewed: Allergy & Precautions, NPO status , Patient's Chart, lab work & pertinent test results  Airway Mallampati: II  TM Distance: >3 FB Neck ROM: Full    Dental no notable dental hx.    Pulmonary neg pulmonary ROS   Pulmonary exam normal        Cardiovascular negative cardio ROS  Rhythm:Regular Rate:Normal     Neuro/Psych  Headaches  negative psych ROS   GI/Hepatic Neg liver ROS,GERD  ,,  Endo/Other  diabetes  Class 3 obesity  Renal/GU negative Renal ROS  Female GU complaint     Musculoskeletal negative musculoskeletal ROS (+)    Abdominal Normal abdominal exam  (+)   Peds  Hematology  (+) Blood dyscrasia, anemia   Anesthesia Other Findings PCOS  Reproductive/Obstetrics                             Anesthesia Physical Anesthesia Plan  ASA: 3  Anesthesia Plan: General   Post-op Pain Management: Celebrex  PO (pre-op)*, Tylenol  PO (pre-op)* and Gabapentin  PO (pre-op)*   Induction: Intravenous  PONV Risk Score and Plan: 3 and Ondansetron , Dexamethasone , Midazolam  and Treatment may vary due to age or medical condition  Airway Management Planned: Mask and Oral ETT  Additional Equipment: None  Intra-op Plan:   Post-operative Plan: Extubation in OR  Informed Consent: I have reviewed the patients History and Physical, chart, labs and discussed the procedure including the risks, benefits and alternatives for the proposed anesthesia with the patient or authorized representative who has indicated his/her understanding and acceptance.     Dental advisory given  Plan Discussed with: CRNA  Anesthesia Plan Comments:        Anesthesia Quick Evaluation

## 2024-01-02 ENCOUNTER — Ambulatory Visit (HOSPITAL_COMMUNITY): Payer: 59 | Admitting: Anesthesiology

## 2024-01-02 ENCOUNTER — Other Ambulatory Visit: Payer: Self-pay

## 2024-01-02 ENCOUNTER — Encounter (HOSPITAL_COMMUNITY)
Admission: RE | Disposition: A | Discharge status: Home / Self Care | Payer: Self-pay | Source: Ambulatory Visit | Attending: Psychiatry

## 2024-01-02 ENCOUNTER — Encounter (HOSPITAL_COMMUNITY): Payer: Self-pay | Admitting: Psychiatry

## 2024-01-02 ENCOUNTER — Ambulatory Visit (HOSPITAL_BASED_OUTPATIENT_CLINIC_OR_DEPARTMENT_OTHER): Payer: 59 | Admitting: Anesthesiology

## 2024-01-02 ENCOUNTER — Ambulatory Visit (HOSPITAL_COMMUNITY)
Admission: RE | Admit: 2024-01-02 | Discharge: 2024-01-02 | Disposition: A | Discharge status: Home / Self Care | Payer: 59 | Source: Ambulatory Visit | Attending: Psychiatry | Admitting: Psychiatry

## 2024-01-02 DIAGNOSIS — N72 Inflammatory disease of cervix uteri: Secondary | ICD-10-CM | POA: Insufficient documentation

## 2024-01-02 DIAGNOSIS — N8502 Endometrial intraepithelial neoplasia [EIN]: Secondary | ICD-10-CM | POA: Diagnosis present

## 2024-01-02 DIAGNOSIS — E119 Type 2 diabetes mellitus without complications: Secondary | ICD-10-CM

## 2024-01-02 DIAGNOSIS — K219 Gastro-esophageal reflux disease without esophagitis: Secondary | ICD-10-CM | POA: Insufficient documentation

## 2024-01-02 DIAGNOSIS — E282 Polycystic ovarian syndrome: Secondary | ICD-10-CM | POA: Insufficient documentation

## 2024-01-02 DIAGNOSIS — Z6841 Body Mass Index (BMI) 40.0 and over, adult: Secondary | ICD-10-CM | POA: Insufficient documentation

## 2024-01-02 DIAGNOSIS — Z793 Long term (current) use of hormonal contraceptives: Secondary | ICD-10-CM | POA: Diagnosis not present

## 2024-01-02 DIAGNOSIS — D251 Intramural leiomyoma of uterus: Secondary | ICD-10-CM | POA: Insufficient documentation

## 2024-01-02 DIAGNOSIS — E66813 Obesity, class 3: Secondary | ICD-10-CM | POA: Diagnosis not present

## 2024-01-02 HISTORY — PX: ROBOTIC ASSISTED LAPAROSCOPIC HYSTERECTOMY AND SALPINGECTOMY: SHX6379

## 2024-01-02 HISTORY — PX: SENTINEL NODE BIOPSY: SHX6608

## 2024-01-02 LAB — ABO/RH: ABO/RH(D): A POS

## 2024-01-02 LAB — TYPE AND SCREEN
ABO/RH(D): A POS
Antibody Screen: NEGATIVE

## 2024-01-02 LAB — POCT PREGNANCY, URINE: Preg Test, Ur: NEGATIVE

## 2024-01-02 SURGERY — XI ROBOTIC ASSISTED LAPAROSCOPIC HYSTERECTOMY AND SALPINGECTOMY
Anesthesia: General

## 2024-01-02 MED ORDER — PHENYLEPHRINE HCL (PRESSORS) 10 MG/ML IV SOLN
INTRAVENOUS | Status: DC | PRN
  Administered 2024-01-02 (×3): 80 ug via INTRAVENOUS

## 2024-01-02 MED ORDER — BUPIVACAINE HCL 0.25 % IJ SOLN
INTRAMUSCULAR | Status: DC | PRN
  Administered 2024-01-02: 32 mL

## 2024-01-02 MED ORDER — PHENYLEPHRINE HCL-NACL 20-0.9 MG/250ML-% IV SOLN
INTRAVENOUS | Status: DC | PRN
Start: 2024-01-02 — End: 2024-01-02
  Administered 2024-01-02: 15 ug/min via INTRAVENOUS

## 2024-01-02 MED ORDER — GABAPENTIN 300 MG PO CAPS
300.0000 mg | ORAL_CAPSULE | ORAL | Status: AC
  Administered 2024-01-02: 300 mg via ORAL
  Filled 2024-01-02: qty 1

## 2024-01-02 MED ORDER — ACETAMINOPHEN 500 MG PO TABS
1000.0000 mg | ORAL_TABLET | ORAL | Status: AC
Start: 2024-01-02 — End: 2024-01-02
  Administered 2024-01-02: 1000 mg via ORAL
  Filled 2024-01-02: qty 2

## 2024-01-02 MED ORDER — PROPOFOL 10 MG/ML IV BOLUS
INTRAVENOUS | Status: DC | PRN
  Administered 2024-01-02: 200 mg via INTRAVENOUS

## 2024-01-02 MED ORDER — ESMOLOL HCL 100 MG/10ML IV SOLN
INTRAVENOUS | Status: DC | PRN
  Administered 2024-01-02: 20 mg via INTRAVENOUS

## 2024-01-02 MED ORDER — SUGAMMADEX SODIUM 200 MG/2ML IV SOLN
INTRAVENOUS | Status: DC | PRN
  Administered 2024-01-02: 200 mg via INTRAVENOUS

## 2024-01-02 MED ORDER — ORAL CARE MOUTH RINSE: 15.0000 mL | Freq: Once | OROMUCOSAL | Status: AC

## 2024-01-02 MED ORDER — PHENYLEPHRINE HCL (PRESSORS) 10 MG/ML IV SOLN
INTRAVENOUS | Status: AC
  Filled 2024-01-02: qty 1

## 2024-01-02 MED ORDER — PROPOFOL 10 MG/ML IV BOLUS
INTRAVENOUS | Status: AC
  Filled 2024-01-02: qty 20

## 2024-01-02 MED ORDER — SODIUM CHLORIDE 0.9 % IV SOLN
3.0000 g | INTRAVENOUS | Status: AC
Start: 2024-01-02 — End: 2024-01-02
  Administered 2024-01-02: 3 g via INTRAVENOUS
  Filled 2024-01-02: qty 3

## 2024-01-02 MED ORDER — KETAMINE HCL 10 MG/ML IJ SOLN
INTRAMUSCULAR | Status: DC | PRN
  Administered 2024-01-02: 30 mg via INTRAVENOUS

## 2024-01-02 MED ORDER — ONDANSETRON HCL 4 MG/2ML IJ SOLN
INTRAMUSCULAR | Status: DC | PRN
  Administered 2024-01-02: 4 mg via INTRAVENOUS

## 2024-01-02 MED ORDER — CHLORHEXIDINE GLUCONATE 0.12 % MT SOLN
15.0000 mL | Freq: Once | OROMUCOSAL | Status: AC
  Administered 2024-01-02: 15 mL via OROMUCOSAL

## 2024-01-02 MED ORDER — PHENYLEPHRINE 80 MCG/ML (10ML) SYRINGE FOR IV PUSH (FOR BLOOD PRESSURE SUPPORT)
PREFILLED_SYRINGE | INTRAVENOUS | Status: AC
  Filled 2024-01-02: qty 10

## 2024-01-02 MED ORDER — OXYCODONE HCL 5 MG PO TABS
ORAL_TABLET | ORAL | Status: AC
  Filled 2024-01-02: qty 1

## 2024-01-02 MED ORDER — STERILE WATER FOR INJECTION IJ SOLN
INTRAMUSCULAR | Status: DC | PRN
  Administered 2024-01-02: 4 mL via INTRAMUSCULAR

## 2024-01-02 MED ORDER — LACTATED RINGERS IV SOLN: INTRAVENOUS | Status: DC | PRN

## 2024-01-02 MED ORDER — LACTATED RINGERS IV SOLN
INTRAVENOUS | Status: DC
Start: 2024-01-02 — End: 2024-01-02

## 2024-01-02 MED ORDER — STERILE WATER FOR IRRIGATION IR SOLN
Status: DC | PRN
  Administered 2024-01-02: 1000 mL

## 2024-01-02 MED ORDER — FENTANYL CITRATE PF 50 MCG/ML IJ SOSY
PREFILLED_SYRINGE | INTRAMUSCULAR | Status: AC
  Filled 2024-01-02: qty 2

## 2024-01-02 MED ORDER — CELECOXIB 200 MG PO CAPS
200.0000 mg | ORAL_CAPSULE | Freq: Once | ORAL | Status: AC
  Administered 2024-01-02: 200 mg via ORAL
  Filled 2024-01-02: qty 1

## 2024-01-02 MED ORDER — ROCURONIUM BROMIDE 100 MG/10ML IV SOLN
INTRAVENOUS | Status: DC | PRN
  Administered 2024-01-02: 10 mg via INTRAVENOUS
  Administered 2024-01-02: 20 mg via INTRAVENOUS
  Administered 2024-01-02: 70 mg via INTRAVENOUS

## 2024-01-02 MED ORDER — ESMOLOL HCL 100 MG/10ML IV SOLN
INTRAVENOUS | Status: AC
  Filled 2024-01-02: qty 10

## 2024-01-02 MED ORDER — SCOPOLAMINE 1 MG/3DAYS TD PT72
1.0000 | MEDICATED_PATCH | TRANSDERMAL | Status: DC
  Administered 2024-01-02: 1.5 mg via TRANSDERMAL
  Filled 2024-01-02: qty 1

## 2024-01-02 MED ORDER — BUPIVACAINE HCL 0.25 % IJ SOLN
INTRAMUSCULAR | Status: AC
  Filled 2024-01-02: qty 1

## 2024-01-02 MED ORDER — SODIUM CHLORIDE 0.9% FLUSH: 3.0000 mL | Freq: Two times a day (BID) | INTRAVENOUS | Status: DC

## 2024-01-02 MED ORDER — DROPERIDOL 2.5 MG/ML IJ SOLN: 0.6250 mg | Freq: Once | INTRAMUSCULAR | Status: DC | PRN

## 2024-01-02 MED ORDER — FENTANYL CITRATE (PF) 100 MCG/2ML IJ SOLN
INTRAMUSCULAR | Status: DC | PRN
  Administered 2024-01-02: 50 ug via INTRAVENOUS
  Administered 2024-01-02: 100 ug via INTRAVENOUS

## 2024-01-02 MED ORDER — ONDANSETRON HCL 4 MG/2ML IJ SOLN
INTRAMUSCULAR | Status: AC
  Filled 2024-01-02: qty 2

## 2024-01-02 MED ORDER — KETAMINE HCL 50 MG/5ML IJ SOSY
PREFILLED_SYRINGE | INTRAMUSCULAR | Status: AC
  Filled 2024-01-02: qty 5

## 2024-01-02 MED ORDER — STERILE WATER FOR INJECTION IJ SOLN
INTRAMUSCULAR | Status: DC | PRN
  Administered 2024-01-02: 10 mL

## 2024-01-02 MED ORDER — STERILE WATER FOR INJECTION IJ SOLN
INTRAMUSCULAR | Status: AC
  Filled 2024-01-02: qty 10

## 2024-01-02 MED ORDER — LEVONORGESTREL 20 MCG/DAY IU IUD
1.0000 | INTRAUTERINE_SYSTEM | INTRAUTERINE | Status: DC
Start: 2024-01-02 — End: 2024-01-02
  Filled 2024-01-02: qty 1

## 2024-01-02 MED ORDER — OXYCODONE HCL 5 MG PO TABS
5.0000 mg | ORAL_TABLET | Freq: Once | ORAL | Status: AC | PRN
  Administered 2024-01-02: 5 mg via ORAL

## 2024-01-02 MED ORDER — HEPARIN SODIUM (PORCINE) 5000 UNIT/ML IJ SOLN
5000.0000 [IU] | INTRAMUSCULAR | Status: AC
Start: 2024-01-02 — End: 2024-01-02
  Administered 2024-01-02: 5000 [IU] via SUBCUTANEOUS
  Filled 2024-01-02: qty 1

## 2024-01-02 MED ORDER — DEXAMETHASONE SODIUM PHOSPHATE 4 MG/ML IJ SOLN
4.0000 mg | INTRAMUSCULAR | Status: AC
Start: 2024-01-02 — End: 2024-01-02
  Administered 2024-01-02: 8 mg via INTRAVENOUS

## 2024-01-02 MED ORDER — SODIUM CHLORIDE 0.9% FLUSH: 3.0000 mL | INTRAVENOUS | Status: DC | PRN

## 2024-01-02 MED ORDER — ACETAMINOPHEN 500 MG PO TABS: 1000.0000 mg | ORAL_TABLET | Freq: Once | ORAL | Status: DC

## 2024-01-02 MED ORDER — FENTANYL CITRATE (PF) 250 MCG/5ML IJ SOLN
INTRAMUSCULAR | Status: AC
  Filled 2024-01-02: qty 5

## 2024-01-02 MED ORDER — MIDAZOLAM HCL 5 MG/5ML IJ SOLN
INTRAMUSCULAR | Status: DC | PRN
  Administered 2024-01-02: 2 mg via INTRAVENOUS

## 2024-01-02 MED ORDER — MIDAZOLAM HCL 2 MG/2ML IJ SOLN
INTRAMUSCULAR | Status: AC
  Filled 2024-01-02: qty 2

## 2024-01-02 MED ORDER — FENTANYL CITRATE PF 50 MCG/ML IJ SOSY
25.0000 ug | PREFILLED_SYRINGE | INTRAMUSCULAR | Status: DC | PRN
  Administered 2024-01-02 (×2): 50 ug via INTRAVENOUS

## 2024-01-02 MED ORDER — LIDOCAINE HCL (CARDIAC) PF 100 MG/5ML IV SOSY
PREFILLED_SYRINGE | INTRAVENOUS | Status: DC | PRN
  Administered 2024-01-02: 80 mg via INTRAVENOUS

## 2024-01-02 MED ORDER — DEXAMETHASONE SODIUM PHOSPHATE 10 MG/ML IJ SOLN
INTRAMUSCULAR | Status: AC
  Filled 2024-01-02: qty 1

## 2024-01-02 MED ORDER — METRONIDAZOLE 500 MG/100ML IV SOLN
500.0000 mg | INTRAVENOUS | Status: AC
  Administered 2024-01-02: 500 mg via INTRAVENOUS
  Filled 2024-01-02: qty 100

## 2024-01-02 SURGICAL SUPPLY — 87 items
APPLICATOR SURGIFLO ENDO (HEMOSTASIS) IMPLANT
BAG COUNTER SPONGE SURGICOUNT (BAG) ×2 IMPLANT
BAG LAPAROSCOPIC 12 15 PORT 16 (BASKET) IMPLANT
BAG RETRIEVAL 12/15 (BASKET)
BLADE SURG SZ10 CARB STEEL (BLADE) IMPLANT
CATH ROBINSON RED A/P 16FR (CATHETERS) ×2 IMPLANT
COVER BACK TABLE 60X90IN (DRAPES) ×2 IMPLANT
COVER TIP SHEARS 8 DVNC (MISCELLANEOUS) ×2 IMPLANT
DERMABOND ADVANCED .7 DNX12 (GAUZE/BANDAGES/DRESSINGS) ×2 IMPLANT
DEVICE MYOSURE LITE (MISCELLANEOUS) IMPLANT
DEVICE MYOSURE REACH (MISCELLANEOUS) IMPLANT
DILATOR CANAL MILEX (MISCELLANEOUS) IMPLANT
DRAPE ARM DVNC X/XI (DISPOSABLE) ×8 IMPLANT
DRAPE COLUMN DVNC XI (DISPOSABLE) ×2 IMPLANT
DRAPE SHEET LG 3/4 BI-LAMINATE (DRAPES) ×2 IMPLANT
DRAPE SURG IRRIG POUCH 19X23 (DRAPES) ×2 IMPLANT
DRIVER NDL MEGA SUTCUT DVNCXI (INSTRUMENTS) ×2 IMPLANT
DRIVER NDLE MEGA SUTCUT DVNCXI (INSTRUMENTS) ×2
DRSG OPSITE POSTOP 4X6 (GAUZE/BANDAGES/DRESSINGS) IMPLANT
DRSG OPSITE POSTOP 4X8 (GAUZE/BANDAGES/DRESSINGS) IMPLANT
ELECT PENCIL ROCKER SW 15FT (MISCELLANEOUS) IMPLANT
ELECT REM PT RETURN 15FT ADLT (MISCELLANEOUS) ×2 IMPLANT
FORCEPS BPLR FENES DVNC XI (FORCEP) ×2 IMPLANT
FORCEPS PROGRASP DVNC XI (FORCEP) ×2 IMPLANT
GAUZE 4X4 16PLY ~~LOC~~+RFID DBL (SPONGE) ×2 IMPLANT
GLOVE BIO SURGEON STRL SZ 6 (GLOVE) ×8 IMPLANT
GLOVE BIO SURGEON STRL SZ 6.5 (GLOVE) ×2 IMPLANT
GLOVE BIOGEL PI IND STRL 6.5 (GLOVE) ×4 IMPLANT
GOWN STRL REUS W/ TWL LRG LVL3 (GOWN DISPOSABLE) ×8 IMPLANT
GRASPER SUT TROCAR 14GX15 (MISCELLANEOUS) IMPLANT
HOLDER FOLEY CATH W/STRAP (MISCELLANEOUS) IMPLANT
IRRIG SUCT STRYKERFLOW 2 WTIP (MISCELLANEOUS) ×2
IRRIGATION SUCT STRKRFLW 2 WTP (MISCELLANEOUS) ×2 IMPLANT
IV NS IRRIG 3000ML ARTHROMATIC (IV SOLUTION) ×2 IMPLANT
KIT PROCEDURE DVNC SI (MISCELLANEOUS) IMPLANT
KIT PROCEDURE FLUENT (KITS) IMPLANT
KIT TURNOVER KIT A (KITS) IMPLANT
LIGASURE IMPACT 36 18CM CVD LR (INSTRUMENTS) IMPLANT
LOOP CUTTING BIPOLAR 21FR (ELECTRODE) IMPLANT
MANIPULATOR ADVINCU DEL 3.0 PL (MISCELLANEOUS) IMPLANT
MANIPULATOR ADVINCU DEL 3.5 PL (MISCELLANEOUS) IMPLANT
MANIPULATOR UTERINE 4.5 ZUMI (MISCELLANEOUS) IMPLANT
MYOSURE XL FIBROID (MISCELLANEOUS)
NDL HYPO 21X1.5 SAFETY (NEEDLE) ×2 IMPLANT
NDL INSUFFLATION 14GA 120MM (NEEDLE) IMPLANT
NDL SPNL 20GX3.5 QUINCKE YW (NEEDLE) IMPLANT
NEEDLE HYPO 21X1.5 SAFETY (NEEDLE) ×2
NEEDLE INSUFFLATION 14GA 120MM (NEEDLE)
NEEDLE SPNL 20GX3.5 QUINCKE YW (NEEDLE) ×2
OBTURATOR OPTICAL STND 8 DVNC (TROCAR) ×2
OBTURATOR OPTICALSTD 8 DVNC (TROCAR) ×2 IMPLANT
PACK LITHOTOMY IV (CUSTOM PROCEDURE TRAY) ×2 IMPLANT
PACK ROBOT GYN CUSTOM WL (TRAY / TRAY PROCEDURE) ×2 IMPLANT
PACK VAGINAL WOMENS (CUSTOM PROCEDURE TRAY) ×2 IMPLANT
PAD ARMBOARD 7.5X6 YLW CONV (MISCELLANEOUS) ×2 IMPLANT
PAD OB MATERNITY 11 LF (PERSONAL CARE ITEMS) IMPLANT
PAD POSITIONING PINK XL (MISCELLANEOUS) ×2 IMPLANT
PORT ACCESS TROCAR AIRSEAL 12 (TROCAR) IMPLANT
SCISSORS MNPLR CVD DVNC XI (INSTRUMENTS) ×2 IMPLANT
SCRUB CHG 4% DYNA-HEX 4OZ (MISCELLANEOUS) ×4 IMPLANT
SEAL ROD LENS SCOPE MYOSURE (ABLATOR) IMPLANT
SEAL UNIV 5-12 XI (MISCELLANEOUS) ×8 IMPLANT
SET TRI-LUMEN FLTR TB AIRSEAL (TUBING) ×2 IMPLANT
SPIKE FLUID TRANSFER (MISCELLANEOUS) ×2 IMPLANT
SPONGE T-LAP 18X18 ~~LOC~~+RFID (SPONGE) IMPLANT
SURGIFLO W/THROMBIN 8M KIT (HEMOSTASIS) IMPLANT
SUT MNCRL AB 4-0 PS2 18 (SUTURE) IMPLANT
SUT PDS AB 1 TP1 54 (SUTURE) IMPLANT
SUT VIC AB 0 CT1 27XBRD ANTBC (SUTURE) IMPLANT
SUT VIC AB 2-0 CT1 TAPERPNT 27 (SUTURE) IMPLANT
SUT VIC AB 4-0 PS2 18 (SUTURE) ×4 IMPLANT
SUT VICRYL 0 27 CT2 27 ABS (SUTURE) ×2 IMPLANT
SUT VLOC 180 0 9IN GS21 (SUTURE) IMPLANT
SYR 10ML LL (SYRINGE) IMPLANT
SYS BAG RETRIEVAL 10MM (BASKET)
SYS WOUND ALEXIS 18CM MED (MISCELLANEOUS)
SYSTEM BAG RETRIEVAL 10MM (BASKET) IMPLANT
SYSTEM TISS REMOVAL MYOSURE XL (MISCELLANEOUS) IMPLANT
SYSTEM WOUND ALEXIS 18CM MED (MISCELLANEOUS) IMPLANT
TOWEL OR 17X26 10 PK STRL BLUE (TOWEL DISPOSABLE) ×2 IMPLANT
TRAP SPECIMEN MUCUS 40CC (MISCELLANEOUS) IMPLANT
TRAY FOLEY MTR SLVR 16FR STAT (SET/KITS/TRAYS/PACK) ×2 IMPLANT
TROCAR PORT AIRSEAL 5X120 (TROCAR) IMPLANT
UNDERPAD 30X36 HEAVY ABSORB (UNDERPADS AND DIAPERS) ×4 IMPLANT
WATER STERILE IRR 1000ML POUR (IV SOLUTION) ×2 IMPLANT
WATER STERILE IRR 500ML POUR (IV SOLUTION) ×2 IMPLANT
YANKAUER SUCT BULB TIP 10FT TU (MISCELLANEOUS) IMPLANT

## 2024-01-02 NOTE — H&P (Signed)
 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   Consent obtained in office is accurate. Risks, benefits, and alternatives to surgery were reviewed, and all questions were answered.  Proceed to the OR as planned.     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 She was recently seen in clinic, where a detailed HPI can be found. She was noted to benefit from: Procedure(s) (LRB): XI ROBOTIC ASSISTED LAPAROSCOPIC HYSTERECTOMY AND BILATERAL SALPINGECTOMY (Bilateral) SENTINEL NODE BIOPSY (N/A) POSSIBLE DILATATION AND CURETTAGE (N/A) POSSIBLE MIRENA  INTRAUTERINE DEVICE (IUD) INSERTION (N/A).   Past Medical History:  Diagnosis Date   Allergic rhinitis    Allergic rhinitis with postnasal drip    Congenital osteopetrosis    previously managed by specialist at [facility], periodic hearing tests   Diabetes mellitus without complication (HCC)    Epigastric pain    Fibroids    Foot fracture, right    Jones fx 5th metatarsal   Headache    Impaired fasting glucose    Insomnia    Low back pain    LUQ pain    Microcytosis    Morbid obesity with BMI of 40.0-44.9, adult (HCC)    PCOS (polycystic ovarian syndrome)    Past Surgical History:  Procedure Laterality Date   NASAL SINUS SURGERY      Allergies Patient has no known allergies.  Medications   Current Facility-Administered Medications  Medication Dose Route Frequency  Provider Last Rate Last Admin   ceFAZolin  (ANCEF ) 3 g in sodium chloride  0.9 % 100 mL IVPB  3 g Intravenous On Call to OR Cross, Melissa D, NP       dexamethasone  (DECADRON ) injection 4 mg  4 mg Intravenous On Call to OR Cross, Eleanor BIRCH, NP       lactated ringers  infusion   Intravenous Continuous Gwenith Ryder, MD 10 mL/hr at 01/02/24 0630 New Bag at 01/02/24 0630   levonorgestrel  (MIRENA ) 20 MCG/DAY IUD 1 each  1 each Intrauterine To OR Cross, Melissa D, NP       metroNIDAZOLE  (FLAGYL ) IVPB 500 mg  500 mg Intravenous On Call to OR Cross, Melissa D, NP       scopolamine  (TRANSDERM-SCOP) 1 MG/3DAYS 1.5 mg  1 patch Transdermal On Call to OR Cross, Melissa D, NP   1.5 mg at 01/02/24 9380    Vital Signs BP (!) 157/95   Pulse 97   Temp 97.6 F (36.4 C) (Oral)   Resp 18   Ht 5' 5 (1.651 m)   Wt 272 lb (123.4 kg)   LMP 10/30/2023 (Exact Date)   SpO2 98%   BMI 45.26 kg/m  Facility age limit for growth %iles is 20 years. Facility age limit for growth %iles is 20 years..   Physical Exam General: Well developed, appears stated age, in no acute distress  Mental status: Alert and oriented x3 Cardiovascular: Normal Pulmonary: Symmetric chest rise, unlabored breathing Relevant System for Surgery: Surgical site examination deferred to the OR   Labs and Studies: Lab Results  Component  Value Date   WBC 10.1 12/25/2023   HGB 12.7 12/25/2023   HCT 41.4 12/25/2023   PLT 376 12/25/2023    No results found for: INR, APTT \

## 2024-01-02 NOTE — Anesthesia Postprocedure Evaluation (Signed)
 Anesthesia Post Note  Patient: Connie Sims  Procedure(s) Performed: XI ROBOTIC ASSISTED LAPAROSCOPIC HYSTERECTOMY AND BILATERAL SALPINGECTOMY (Bilateral) BILATERAL SENTINEL NODE BIOPSY     Patient location during evaluation: PACU Anesthesia Type: General Level of consciousness: awake and alert Pain management: pain level controlled Vital Signs Assessment: post-procedure vital signs reviewed and stable Respiratory status: spontaneous breathing, nonlabored ventilation, respiratory function stable and patient connected to nasal cannula oxygen Cardiovascular status: blood pressure returned to baseline and stable Postop Assessment: no apparent nausea or vomiting Anesthetic complications: no   No notable events documented.  Last Vitals:  Vitals:   01/02/24 1200 01/02/24 1235  BP: 132/78   Pulse: 76 88  Resp: 14   Temp: 36.7 C   SpO2: 95% 96%    Last Pain:  Vitals:   01/02/24 1235  TempSrc:   PainSc: 4                  Tanish Sinkler P Doreene Forrey

## 2024-01-02 NOTE — Anesthesia Procedure Notes (Signed)
 Procedure Name: Intubation Date/Time: 01/02/2024 7:59 AM  Performed by: Dartha Meckel, CRNAPre-anesthesia Checklist: Patient identified, Emergency Drugs available, Suction available and Patient being monitored Patient Re-evaluated:Patient Re-evaluated prior to induction Oxygen Delivery Method: Circle system utilized Preoxygenation: Pre-oxygenation with 100% oxygen Induction Type: IV induction Ventilation: Mask ventilation without difficulty Laryngoscope Size: Glidescope and 3 Grade View: Grade I Tube type: Oral Tube size: 7.0 mm Number of attempts: 1 Airway Equipment and Method: Stylet and Oral airway Placement Confirmation: ETT inserted through vocal cords under direct vision, positive ETCO2 and breath sounds checked- equal and bilateral Secured at: 21 cm Tube secured with: Tape Dental Injury: Teeth and Oropharynx as per pre-operative assessment

## 2024-01-02 NOTE — Progress Notes (Signed)
 Patient met criteria to leave Phase II, but he transportation will not arrive until 3:30 pm. Patient does not have anyone else to take her home.

## 2024-01-02 NOTE — Op Note (Signed)
 GYNECOLOGIC ONCOLOGY OPERATIVE NOTE  Date of Service: 01/02/2024  Preoperative Diagnosis: Endometrial Intraepithelial Neoplasia, Morbid obesity (BMI 45)  Postoperative Diagnosis: Same  Procedures: Robotic-assisted total laparoscopic hysterectomy, bilateral salpingectomy, bilateral sentinel lymph node evaluation and biopsy  Surgeon: Hoy Masters, MD  Assistants: Olam Mill, MD and (an MD assistant was necessary for tissue manipulation, management of robotic instrumentation, retraction and positioning due to the complexity of the case and hospital policies)  Anesthesia: General  Estimated Blood Loss: 30 mL    Fluids: 1100  ml, crystalloid  Urine Output: 200 ml, clear yellow  Findings: Normal upper abdominal survey with normal liver surface and diaphragm. Normal appearing small and large bowel. Normal uterus, tubes, and ovaries. Approximate 2cm simple cyst on right ovary, possible follicle. No evidence of peritoneal disease, ascites, or carcinomatosis. Sentinel mapping on right to the external iliac; sentinel mapping on left to the external iliac. Additional area crossing the right obturator space that was likely a dilated channel proximal to the right external iliac sentinel lymph node, but this was removed in case this represented the true sentinel lymph node.    Specimens:  ID Type Source Tests Collected by Time Destination  1 : Right External Iliac Sentinel Lymph Node Tissue PATH Sentinel Lymph Node SURGICAL PATHOLOGY Masters Hoy, MD 01/02/2024 0840   2 : Right Obturator Sentinel Lymph Node Tissue PATH Sentinel Lymph Node SURGICAL PATHOLOGY Masters Hoy, MD 01/02/2024 [phone]   3 : left external iliac sentinel lymph node Tissue PATH Sentinel Lymph Node SURGICAL PATHOLOGY Masters Hoy, MD 01/02/2024 [phone]   4 : Uterus, Cervix, Bilateral Tubes Tissue PATH Gyn tumor resection SURGICAL PATHOLOGY Masters Hoy, MD 01/02/2024 [phone]     Complications:  None  Indications for  Procedure: Connie Sims is a 43 y.o. woman with and endometrial biopsy with EIN.  Prior to the procedure, all risks, benefits, and alternatives were discussed and informed surgical consent was signed.  Procedure: Patient was taken to the operating room where general anesthesia was achieved.  She was positioned in dorsal lithotomy and prepped and draped.  A foley catheter was inserted into the bladder. 1 ml of dilute Indo-Cyanine dye was was injected at 1cm and 1mm deep at 3 and 9 o'clock in the cervical stroma.  The cervix was dilated and an Advincula uterine manipulator with a colpotomy ring was inserted into the uterus.  A 12 mm incision was made in the left upper quadrant near Palmer's point.  The abdomen was entered with a 5 mm OptiView trocar under direct visualization.  The abdomen was insufflated, the patient placed in steep Trendelenburg, and additional trocars were placed as follows: an 8mm trocar superior to the umbilicus, two 8 mm robotic trocars in the right abdomen, and one 8 mm robotic trocar in the left abdomen.  The left upper quadrant trocar was removed and replaced with a 12 mm airseal trocar.  All trocars were placed under direct visualization.  The bowels were moved into the upper abdomen.  The DaVinci robotic surgical system was brought to the patient's bedside and docked.  The right round ligament was transected and the retroperitoneum entered.The right ureter was identified. The paravesical and pararectal spaces were opened, and the node was found to be located at the right external iliac vein. Proximal to this was likely a dilated channel leading to the sentinel lymph node, crossing the obturator space. A sentinel lymph node dissection was performed taking care to avoid injury to the ureter, superior vesicle artery or obturator  nerve. The likely dilated channel was also excised in case this were to represent the true sentinel lymph node but did not appear grossing to involve lymph  node tissue but given increased retroperitoneal adiposity this was difficult to discern A similar procedure was performed on left with the sentinel lymph node identified at the left external iliac vessels. The sentinel lymph nodes mentioned above were identified and removed through the assistant trocar.  The left ureter was again identified. The mesosalpinx underlying the left fallopian tube was cauterized and transected from the fimbriae to the uterine cornua. A window was made in the medial leaf of the broad near the cornua. The left utero-ovarian ligament was isolated, cauterized and transected, removing the left ovary from the uterus. The posterior peritoneum was opened to the colpotomy ring. The anterior peritoneum was opened and the bladder flap was created. The left uterine artery was skeletonized, cauterized, and transected at the level of the colpotomy ring. Additional cautery was used in a C-shaped fashion to allow the remainder of the broad, cardinal, and uterosacral ligaments with the uterine vessels to be transected and fall away from the colpotomy ring.  A similar procedure was performed on the contralateral side. A colpotomy was made circumferentially following the contours of the colpotomy ring.  The uterine specimen was removed through the vagina.   The vaginal cuff was closed with a running stitch of 0 Vicryl suture.  The pelvis was irrigated and all operative sites were found to be hemostatic.  All instruments were removed and the robot was taken from the patient's bedside. The fascia at the 12 mm incision was closed with 0 Vicryl using a PMI device. The abdomen was desufflated and all ports were removed. The skin at all incisions was closed with 4-0 Vicryl to reapproximate the subcutaneous tissue and 4-0 monocryl in a subcuticular fashion followed by surgical glue.  Patient tolerated the procedure well. Sponge, lap, and instrument counts were correct.  Patient received 3 gm of Ancef  and  500mg  of metronidazole  prior to skin incision for routine perioperative antibiotic prophylaxis.  She was extubated and taken to the PACU in stable condition.  Hoy Masters, MD Gynecologic Oncology

## 2024-01-02 NOTE — Discharge Instructions (Addendum)
 AFTER SURGERY INSTRUCTIONS   Return to work: 4-6 weeks if applicable   Activity: 1. Be up and out of the bed during the day.  Take a nap if needed.  You may walk up steps but be careful and use the hand rail.  Stair climbing will tire you more than you think, you may need to stop part way and rest.    2. No lifting or straining for 6 weeks over 10 pounds. No pushing, pulling, straining for 6 weeks.   3. No driving for around 5-78 days when the following criteria have been met.  Do not drive if you are taking narcotic pain medicine and make sure that your reaction time has returned.    4. You can shower as soon as the next day after surgery. Shower daily.  Use your regular soap and water (not directly on the incision) and pat your incision(s) dry afterwards; don't rub.  No tub baths or submerging your body in water until cleared by your surgeon. If you have the soap that was given to you by pre-surgical testing that was used before surgery, you do not need to use it afterwards because this can irritate your incisions.    5. No sexual activity and nothing in the vagina for 12 weeks.   6. You may experience a small amount of clear drainage from your incisions, which is normal.  If the drainage persists, increases, or changes color please call the office.   7. Do not use creams, lotions, or ointments such as neosporin on your incisions after surgery until advised by your surgeon because they can cause removal of the dermabond glue on your incisions.     8. You may experience vaginal spotting after surgery or when the stitches at the top of the vagina begin to dissolve.  The spotting is normal but if you experience heavy bleeding, call our office.   9. Take Tylenol or ibuprofen first for pain if you are able to take these medications and only use narcotic pain medication for severe pain not relieved by the Tylenol or Ibuprofen.  Monitor your Tylenol intake to a max of 4,000 mg in a 24 hour period.  You can alternate these medications after surgery.   Diet: 1. Low sodium Heart Healthy Diet is recommended but you are cleared to resume your normal (before surgery) diet after your procedure.   2. It is safe to use a laxative, such as Miralax or Colace, if you have difficulty moving your bowels. You have been prescribed Sennakot-S to take at bedtime every evening after surgery to keep bowel movements regular and to prevent constipation.     Wound Care: 1. Keep clean and dry.  Shower daily.   Reasons to call the Doctor: Fever - Oral temperature greater than 100.4 degrees Fahrenheit Foul-smelling vaginal discharge Difficulty urinating Nausea and vomiting Increased pain at the site of the incision that is unrelieved with pain medicine. Difficulty breathing with or without chest pain New calf pain especially if only on one side Sudden, continuing increased vaginal bleeding with or without clots.   Contacts: For questions or concerns you should contact:   Dr. Clide Cliff at [phone]   Warner Mccreedy, NP at [phone]   After Hours: call [phone] and have the GYN Oncologist paged/contacted (after 5 pm or on the weekends). You will speak with an after hours RN and let he or she know you have had surgery.   Messages sent via mychart are for non-urgent  matters and are not responded to after hours so for urgent needs, please call the after hours number.

## 2024-01-02 NOTE — Transfer of Care (Signed)
 Immediate Anesthesia Transfer of Care Note  Patient: Connie Sims  Procedure(s) Performed: XI ROBOTIC ASSISTED LAPAROSCOPIC HYSTERECTOMY AND BILATERAL SALPINGECTOMY (Bilateral) BILATERAL SENTINEL NODE BIOPSY  Patient Location: PACU  Anesthesia Type:General  Level of Consciousness: awake and alert   Airway & Oxygen Therapy: Patient Spontanous Breathing and Patient connected to face mask oxygen  Post-op Assessment: Report given to RN and Post -op Vital signs reviewed and stable  Post vital signs: Reviewed and stable  Last Vitals:  Vitals Value Taken Time  BP 122/62 01/02/24 1004  Temp    Pulse 87 01/02/24 1005  Resp 23 01/02/24 1005  SpO2 100 % 01/02/24 1005  Vitals shown include unfiled device data.  Last Pain:  Vitals:   01/02/24 0619  TempSrc: Oral  PainSc:          Complications: No notable events documented.

## 2024-01-03 ENCOUNTER — Other Ambulatory Visit: Payer: Self-pay | Admitting: Physician Assistant

## 2024-01-03 ENCOUNTER — Telehealth: Payer: Self-pay | Admitting: *Deleted

## 2024-01-03 ENCOUNTER — Encounter (HOSPITAL_COMMUNITY): Payer: Self-pay | Admitting: Psychiatry

## 2024-01-03 DIAGNOSIS — G4761 Periodic limb movement disorder: Secondary | ICD-10-CM

## 2024-01-03 NOTE — Telephone Encounter (Signed)
 Spoke with Connie Sims this morning. She states she is eating, drinking and urinating well. She has not had a BM yet but is passing some gas. She is taking senokot as prescribed and encouraged her to drink plenty of water . She denies fever or chills. Incisions are dry and intact. She rates her pain 3/10. Her pain is controlled with tramadol  she took x1  last night.     Instructed to call office with any fever, chills, purulent drainage, uncontrolled pain or any other questions or concerns. Patient verbalizes understanding.   Pt aware of post op appointments as well as the office number [phone] and after hours number [phone] to call if she has any questions or concerns

## 2024-01-04 ENCOUNTER — Encounter: Payer: Self-pay | Admitting: Psychiatry

## 2024-01-05 ENCOUNTER — Other Ambulatory Visit: Payer: Self-pay | Admitting: Physician Assistant

## 2024-01-05 DIAGNOSIS — G4761 Periodic limb movement disorder: Secondary | ICD-10-CM

## 2024-01-05 LAB — SURGICAL PATHOLOGY

## 2024-01-05 MED ORDER — PRAMIPEXOLE DIHYDROCHLORIDE 0.5 MG PO TABS: 0.5000 mg | ORAL_TABLET | Freq: Every day | ORAL | 0 refills | Status: DC

## 2024-01-08 NOTE — Telephone Encounter (Signed)
 Duplicate request- filled 01/05/24 Requested Prescriptions  Pending Prescriptions Disp Refills   pramipexole  (MIRAPEX ) 0.5 MG tablet [Pharmacy Med Name: PRAMIPEXOLE  0.5 MG TABLET] 60 tablet 0    Sig: TAKE 1 TABLET BY MOUTH AT BEDTIME.     Neurology:  Parkinsonian Agents Passed - 01/08/2024  9:18 AM      Passed - Last BP in normal range    BP Readings from Last 1 Encounters:  01/02/24 132/78         Passed - Last Heart Rate in normal range    Pulse Readings from Last 1 Encounters:  01/02/24 88         Passed - Valid encounter within last 12 months    Recent Outpatient Visits           3 months ago Class 3 severe obesity due to excess calories with serious comorbidity and body mass index (BMI) of 40.0 to 44.9 in adult [facility])   [facility] Mecum, Rocky BRAVO, PA-C   7 months ago Morbid obesity [facility])   [place] [facility] Glenard Mire, MD   1 year ago Morbid obesity [facility])   [place] [facility] Leavy Mole, PA-C   1 year ago Abnormal uterine bleeding   [facility] Gareth Mliss FALCON, FNP   1 year ago Abnormal uterine bleeding   [facility] Madelon Donald HERO, [place]

## 2024-01-15 ENCOUNTER — Inpatient Hospital Stay: Payer: 59 | Attending: Psychiatry | Admitting: Psychiatry

## 2024-01-15 ENCOUNTER — Encounter: Payer: Self-pay | Admitting: Psychiatry

## 2024-01-15 VITALS — BP 127/81 | HR 81 | Temp 97.8°F | Resp 16 | Ht 65.0 in | Wt 271.6 lb

## 2024-01-15 DIAGNOSIS — Z9079 Acquired absence of other genital organ(s): Secondary | ICD-10-CM

## 2024-01-15 DIAGNOSIS — Z90722 Acquired absence of ovaries, bilateral: Secondary | ICD-10-CM

## 2024-01-15 DIAGNOSIS — Z90710 Acquired absence of both cervix and uterus: Secondary | ICD-10-CM

## 2024-01-15 DIAGNOSIS — N8502 Endometrial intraepithelial neoplasia [EIN]: Secondary | ICD-10-CM

## 2024-01-15 NOTE — Progress Notes (Signed)
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  Postop: - Pt recovering well from surgery and healing appropriately postoperatively - Intraoperative findings and pathology results reviewed, benign - Ongoing postoperative expectations and precautions reviewed. Continue with no lifting >15lbs through 6 weeks postoperatively - Okay to return to work at 4 weeks with no lifting >15lbs. No lifting restrictions at 6 weeks. - Okay to return to routine care.   RTC prn.  Clide Cliff, MD Gynecologic Oncology   ----------------------- Reason for Visit: Postop  Interval History: Pt reports that she is recovering well from surgery. She is not needing anything for pain currently. She is eating and drinking well. She is voiding without issue and having regular bowel movements. No bleeding. Doing well.   Past Medical/Surgical History: Past Medical History:  Diagnosis Date   Allergic rhinitis    Allergic rhinitis with postnasal drip    Congenital osteopetrosis    previously managed by specialist at [facility], periodic hearing tests   Diabetes mellitus without complication (HCC)    Epigastric pain    Fibroids    Foot fracture, right    Jones fx 5th metatarsal   Headache    Impaired fasting glucose    Insomnia    Low back pain    LUQ pain    Microcytosis    Morbid obesity with BMI of 40.0-44.9, adult (HCC)    PCOS (polycystic ovarian syndrome)     Past Surgical History:  Procedure Laterality Date   NASAL SINUS SURGERY     ROBOTIC ASSISTED LAPAROSCOPIC HYSTERECTOMY AND SALPINGECTOMY Bilateral 01/02/2024   Procedure: XI ROBOTIC ASSISTED LAPAROSCOPIC HYSTERECTOMY AND BILATERAL SALPINGECTOMY;  Surgeon: Clide Cliff, MD;  Location: [facility];  Service: Gynecology;  Laterality:  Bilateral;   SENTINEL NODE BIOPSY N/A 01/02/2024   Procedure: BILATERAL SENTINEL NODE BIOPSY;  Surgeon: Clide Cliff, MD;  Location: [facility];  Service: Gynecology;  Laterality: N/A;    Family History  Problem Relation Age of Onset   Liver disease Mother        fatty liver, s/p liver biopsy   Alcohol abuse Mother    Uterine cancer Maternal Aunt    Hypertension Maternal Grandmother    Glaucoma Maternal Grandmother    Diabetes Maternal Grandfather    Heart disease Maternal Grandfather    Breast cancer Neg Hx    Ovarian cancer Neg Hx    Colon cancer Neg Hx    Pancreatic cancer Neg Hx    Prostate cancer Neg Hx     Social History   Socioeconomic History   Marital status: Married    Spouse name: Not on file   Number of children: 0   Years of education: Masters   Highest education level: Not on file  Occupational History   Occupation: Art gallery manager   Tobacco Use   Smoking status: Never   Smokeless tobacco: Never  Vaping Use   Vaping status: Never Used  Substance and Sexual Activity   Alcohol use: No    Alcohol/week: 0.0 standard drinks of alcohol   Drug use: No   Sexual activity: Not Currently    Birth control/protection: Condom  Other Topics Concern   Not on file  Social History Narrative   Drinks 2- 20oz pepsi a day   Works for [employer] as an Art gallery manager  She was at [employer] band   She plays the saxophone    Dating someone from [place]    Social Drivers of Health   Financial Resource Strain: Unknown (07/02/2021)   Overall Financial Resource Strain (CARDIA)    Difficulty of Paying Living Expenses: Patient declined  Food Insecurity: Unknown (07/02/2021)   Hunger Vital Sign    Worried About Running Out of Food in the Last Year: Patient declined    Ran Out of Food in the Last Year: Patient declined  Transportation Needs: No Transportation Needs (11/15/2023)   PRAPARE - Administrator, Civil Service (Medical): No    Lack of Transportation (Non-Medical): No   Physical Activity: Inactive (07/02/2021)   Exercise Vital Sign    Days of Exercise per Week: 0 days    Minutes of Exercise per Session: 0 min  Stress: No Stress Concern Present (07/02/2021)   [employer] of Occupational Health - Occupational Stress Questionnaire    Feeling of Stress : Not at all  Social Connections: Moderately Isolated (07/02/2021)   Social Connection and Isolation Panel [NHANES]    Frequency of Communication with Friends and Family: More than three times a week    Frequency of Social Gatherings with Friends and Family: More than three times a week    Attends Religious Services: Never    Database administrator or Organizations: No    Attends Engineer, structural: Never    Marital Status: Married    Current Medications:  Current Outpatient Medications:    cyclobenzaprine (FLEXERIL) 10 MG tablet, TAKE 1 TABLET BY MOUTH THREE TIMES A DAY AS NEEDED FOR MUSCLE SPASMS, Disp: 90 tablet, Rfl: 0   Daridorexant HCl (QUVIVIQ) 25 MG TABS, Take 1 tablet (25 mg total) by mouth at bedtime., Disp: 30 tablet, Rfl: 3   ferrous sulfate 325 (65 FE) MG tablet, TAKE 325 MG BY MOUTH 3 (THREE) TIMES DAILY. (Patient taking differently: daily. Once daily), Disp: 90 tablet, Rfl: 0   pantoprazole (PROTONIX) 40 MG tablet, TAKE 1 TABLET BY MOUTH DAILY BEFORE BREAKFAST, Disp: 30 tablet, Rfl: 0   pramipexole (MIRAPEX) 0.5 MG tablet, Take 1 tablet (0.5 mg total) by mouth at bedtime., Disp: 60 tablet, Rfl: 0   senna-docusate (SENOKOT-S) 8.6-50 MG tablet, Take 2 tablets by mouth at bedtime. For AFTER surgery, do not take if having diarrhea, Disp: 30 tablet, Rfl: 0   tirzepatide (ZEPBOUND) 5 MG/0.5ML Pen, Inject 5 mg into the skin once a week., Disp: 2 mL, Rfl: 0  Review of Symptoms: Complete 10-system review is negative except as above in Interval History.  Physical Exam: BP 127/81 (BP Location: Left Arm, Patient Position: Sitting)   Pulse 81   Temp 97.8 F (36.6 C) (Oral)   Resp 16    Ht 5\' 5"  (1.651 m)   Wt 271 lb 9.6 oz (123.2 kg)   LMP 10/30/2023 (Exact Date)   SpO2 97%   BMI 45.20 kg/m  General: Alert, oriented, no acute distress. HEENT: Normocephalic, atraumatic. Neck symmetric without masses. Sclera anicteric.  Chest: Normal work of breathing. Clear to auscultation bilaterally.   Cardiovascular: Regular rate and rhythm, no murmurs. Abdomen: Soft, nontender.  Normoactive bowel sounds.  No masses appreciated.  Well-healing incisions. Extremities: Grossly normal range of motion.  Warm, well perfused.  No edema bilaterally. Skin: No rashes or lesions noted. GU: Normal appearing external genitalia without erythema, excoriation, or lesions.  Speculum exam reveals intact well healing cuff.  Bimanual exam reveals intact  cuff. Exam chaperoned by Warner Mccreedy, NP   Laboratory & Radiologic Studies: Surgical pathology (01/02/24): A. RIGHT EXTERNAL ILIAC SENTINEL LYMPH NODE, EXCISION: One benign lymph node, negative for carcinoma (0/1)  B. RIGHT OBTURATOR SENTINEL LYMPH NODE, EXCISION: Benign fibroadipose tissue Negative for lymph node  C. LEFT EXTERNAL ILIAC SENTINEL LYMPH NODE, EXCISION: One benign lymph node, negative for carcinoma (0/1)  D. UTERUS WITH RIGHT LEFT FALLOPIAN TUBE, HYSTERECTOMY AND BILATERAL SALPINGECTOMY: Benign inactive endometrium with progestational effect Negative for atypical hyperplasia (EIN) and carcinoma Benign leiomyoma, intramural, measuring 0.8 cm in greatest dimension Chronic cervicitis with squamous metaplasia Benign fallopian tubes  COMMENT:  An immunohistochemical stain for pancytokeratin (AE1/AE3) performed on each of the sentinel lymph nodes (specimen A and C) is negative with adequate control.

## 2024-01-15 NOTE — Patient Instructions (Addendum)
It was a pleasure to see you in clinic today. - Healing well from surgery - No lifting >15lb until 6 weeks after surgery. At 6 weeks, no restrictions - Nothing in the vagina until 10 weeks after surgery.  -No follow up needed with Dr. Alvester Morin unless needs arise in the future.  Thank you very much for allowing me to provide care for you today.  I appreciate your confidence in choosing our Gynecologic Oncology team at [facility].  If you have any questions about your visit today please call our office or send [place] a MyChart message and we will get back to you as soon as possible.

## 2024-01-16 ENCOUNTER — Other Ambulatory Visit: Payer: Self-pay | Admitting: Family Medicine

## 2024-01-16 DIAGNOSIS — D500 Iron deficiency anemia secondary to blood loss (chronic): Secondary | ICD-10-CM

## 2024-01-16 NOTE — Telephone Encounter (Signed)
Request too soon for refill, last refill 12/25/23 for 30 days.  Requested Prescriptions  Pending Prescriptions Disp Refills   ferrous sulfate 325 (65 FE) MG tablet [Pharmacy Med Name: FERROUS SULFATE 325 MG TABLET] 270 tablet 1    Sig: TAKE 325 MG BY MOUTH 3 (THREE) TIMES DAILY.     Endocrinology:  Minerals - Iron Supplementation Failed - 01/16/2024  9:04 AM      Failed - Fe (serum) in normal range and within 360 days    Iron  Date Value Ref Range Status  05/31/2023 39 (L) 40 - 190 mcg/dL Final   %SAT  Date Value Ref Range Status  05/31/2023 8 (L) 16 - 45 % (calc) Final         Passed - HGB in normal range and within 360 days    Hemoglobin  Date Value Ref Range Status  12/25/2023 12.7 12.0 - 15.0 g/dL Final  16/09/9603 54.0 11.1 - 15.9 g/dL Final         Passed - HCT in normal range and within 360 days    HCT  Date Value Ref Range Status  12/25/2023 41.4 36.0 - 46.0 % Final   Hematocrit  Date Value Ref Range Status  02/01/2016 37.3 34.0 - 46.6 % Final         Passed - RBC in normal range and within 360 days    RBC  Date Value Ref Range Status  12/25/2023 4.98 3.87 - 5.11 MIL/uL Final         Passed - Ferritin in normal range and within 360 days    Ferritin  Date Value Ref Range Status  05/31/2023 16 16 - 232 ng/mL Final  02/01/2016 40 15 - 150 ng/mL Final         Passed - Valid encounter within last 12 months    Recent Outpatient Visits           3 months ago Class 3 severe obesity due to excess calories with serious comorbidity and body mass index (BMI) of 40.0 to 44.9 in adult [facility])   [place] [facility] Mecum, Oswaldo Conroy, PA-C   7 months ago Morbid obesity [facility])   [place] [facility] Alba Cory, MD   1 year ago Morbid obesity [facility])   [place] [facility] Danelle Berry, PA-C   1 year ago Abnormal uterine bleeding   [facility] Berniece Salines, FNP   1 year ago Abnormal  uterine bleeding   [facility] Caro Laroche, DO

## 2024-02-02 ENCOUNTER — Telehealth: Payer: Self-pay

## 2024-02-02 NOTE — Telephone Encounter (Signed)
 Opened in error

## 2024-02-02 NOTE — Telephone Encounter (Signed)
 Connie Sims dropped off FMLA disability forms stating her return to work date is 02/14/24. Forms have been filled out and faxed to [place] at [phone].   LVM for patient to call office to make her aware forms have been filled out and faxed.

## 2024-02-04 ENCOUNTER — Other Ambulatory Visit: Payer: Self-pay | Admitting: Physician Assistant

## 2024-02-05 ENCOUNTER — Encounter: Payer: Self-pay | Admitting: Family Medicine

## 2024-02-05 ENCOUNTER — Other Ambulatory Visit: Payer: Self-pay

## 2024-02-05 ENCOUNTER — Telehealth: Payer: Self-pay

## 2024-02-05 MED ORDER — ZEPBOUND 5 MG/0.5ML ~~LOC~~ SOAJ: 5.0000 mg | SUBCUTANEOUS | 0 refills | Status: DC

## 2024-02-05 NOTE — Telephone Encounter (Signed)
 S/P Robotic-assisted total laparoscopic hysterectomy, bilateral salpingectomy, bilateral sentinel lymph node evaluation and biopsy on 01/02/24 with Dr. Daisey Dryer.   Pt called office today stating since her surgery she has been experiencing vaginal discharge. It is clear but sometimes a light brown. Reports no odor, no cramping, no pain, no bleeding. Reports normal BM's, no fever/chills. Reports nothing in the vagina and no heavy lifting greater than 10 pounds.   Pt aware this is normal healing with sutures still in place, they do dissolve. Aware to monitor and call if worsens. Pt aware I will make provider aware and will call if any other advice.

## 2024-02-05 NOTE — Telephone Encounter (Signed)
 LVM for patient to call back. ?

## 2024-02-05 NOTE — Telephone Encounter (Signed)
 Requested medication (s) are due for refill today: Yes  Requested medication (s) are on the active medication list: Yes  Last refill:  12/15/23  Future visit scheduled: No  Notes to clinic:  Unable to refill due to no refill protocol for this medication.      Requested Prescriptions  Pending Prescriptions Disp Refills   ZEPBOUND  5 MG/0.5ML Pen [Pharmacy Med Name: ZEPBOUND  PEN 5MG /0.5ML] 2 mL 0    Sig: INJECT 5 MG INTO THE SKIN WEEKLY     Off-Protocol Failed - 02/05/2024  1:12 PM      Failed - Medication not assigned to a protocol, review manually.      Passed - Valid encounter within last 12 months    Recent Outpatient Visits           3 months ago Class 3 severe obesity due to excess calories with serious comorbidity and body mass index (BMI) of 40.0 to 44.9 in adult [facility])   [place] [facility] Mecum, Pearla Bottom, PA-C   8 months ago Morbid obesity [facility])   [place] [facility] Sowles, Krichna, MD   1 year ago Morbid obesity [facility])   [place] [facility] Adeline Hone, PA-C   1 year ago Abnormal uterine bleeding   [facility] Quinton Buckler, FNP   1 year ago Abnormal uterine bleeding   [facility] Kandis Ormond, [place]

## 2024-03-08 ENCOUNTER — Other Ambulatory Visit: Payer: Self-pay | Admitting: Physician Assistant

## 2024-03-08 DIAGNOSIS — M545 Low back pain, unspecified: Secondary | ICD-10-CM

## 2024-03-08 DIAGNOSIS — G4761 Periodic limb movement disorder: Secondary | ICD-10-CM

## 2024-03-08 NOTE — Telephone Encounter (Signed)
 Requested Prescriptions  Pending Prescriptions Disp Refills   pramipexole (MIRAPEX) 0.5 MG tablet [Pharmacy Med Name: PRAMIPEXOLE 0.5 MG TABLET] 90 tablet 0    Sig: TAKE 1 TABLET BY MOUTH AT BEDTIME.     Neurology:  Parkinsonian Agents Passed - 03/08/2024  2:57 PM      Passed - Last BP in normal range    BP Readings from Last 1 Encounters:  01/15/24 127/81         Passed - Last Heart Rate in normal range    Pulse Readings from Last 1 Encounters:  01/15/24 81         Passed - Valid encounter within last 12 months    Recent Outpatient Visits           5 months ago Class 3 severe obesity due to excess calories with serious comorbidity and body mass index (BMI) of 40.0 to 44.9 in adult [facility])   [place] [facility] Mecum, Oswaldo Conroy, PA-C   9 months ago Morbid obesity [facility])   [place] [facility] Alba Cory, MD   1 year ago Morbid obesity [facility])   [place] [facility] Danelle Berry, PA-C   1 year ago Abnormal uterine bleeding   [facility] Della Goo F, FNP   2 years ago Abnormal uterine bleeding   [place] [facility] M, DO               cyclobenzaprine (FLEXERIL) 10 MG tablet [Pharmacy Med Name: CYCLOBENZAPRINE 10 MG TABLET] 90 tablet 0    Sig: TAKE 1 TABLET BY MOUTH THREE TIMES A DAY AS NEEDED FOR MUSCLE SPASMS     Not Delegated - Analgesics:  Muscle Relaxants Failed - 03/08/2024  2:57 PM      Failed - This refill cannot be delegated      Passed - Valid encounter within last 6 months    Recent Outpatient Visits           5 months ago Class 3 severe obesity due to excess calories with serious comorbidity and body mass index (BMI) of 40.0 to 44.9 in adult [facility])   [place] [facility] Mecum, Oswaldo Conroy, PA-C   9 months ago Morbid obesity [facility])   [place] [facility] Alba Cory, MD   1 year ago Morbid obesity [facility])    [place] [facility] Danelle Berry, PA-C   1 year ago Abnormal uterine bleeding   [facility] Berniece Salines, FNP   2 years ago Abnormal uterine bleeding   [facility] Caro Laroche, DO

## 2024-03-08 NOTE — Telephone Encounter (Signed)
 Requested medication (s) are due for refill today: yes  Requested medication (s) are on the active medication list: yes  Last refill:  01/20/24  Future visit scheduled: no  Notes to clinic:  Unable to refill per protocol, cannot delegate.      Requested Prescriptions  Pending Prescriptions Disp Refills   cyclobenzaprine (FLEXERIL) 10 MG tablet [Pharmacy Med Name: CYCLOBENZAPRINE 10 MG TABLET] 90 tablet 0    Sig: TAKE 1 TABLET BY MOUTH THREE TIMES A DAY AS NEEDED FOR MUSCLE SPASMS     Not Delegated - Analgesics:  Muscle Relaxants Failed - 03/08/2024  2:57 PM      Failed - This refill cannot be delegated      Passed - Valid encounter within last 6 months    Recent Outpatient Visits           5 months ago Class 3 severe obesity due to excess calories with serious comorbidity and body mass index (BMI) of 40.0 to 44.9 in adult [facility])   [place] [facility] Mecum, Erin E, PA-C   9 months ago Morbid obesity [facility])   [place] [facility] Alba Cory, MD   1 year ago Morbid obesity [facility])   [place] [facility] Danelle Berry, PA-C   1 year ago Abnormal uterine bleeding   [facility] Della Goo F, FNP   2 years ago Abnormal uterine bleeding   [facility] Caro Laroche, DO              Signed Prescriptions Disp Refills   pramipexole (MIRAPEX) 0.5 MG tablet 90 tablet 0    Sig: TAKE 1 TABLET BY MOUTH AT BEDTIME.     Neurology:  Parkinsonian Agents Passed - 03/08/2024  2:57 PM      Passed - Last BP in normal range    BP Readings from Last 1 Encounters:  01/15/24 127/81         Passed - Last Heart Rate in normal range    Pulse Readings from Last 1 Encounters:  01/15/24 81         Passed - Valid encounter within last 12 months    Recent Outpatient Visits           5 months ago Class 3 severe obesity due to excess calories with serious comorbidity and body mass  index (BMI) of 40.0 to 44.9 in adult [facility]   [place] [facility] Mecum, Oswaldo Conroy, PA-C   9 months ago Morbid obesity [facility])   [place] [facility] Alba Cory, MD   1 year ago Morbid obesity [facility])    [facility] Danelle Berry, PA-C   1 year ago Abnormal uterine bleeding   [facility] Berniece Salines, FNP   2 years ago Abnormal uterine bleeding   [facility] Caro Laroche, [place]

## 2024-03-24 ENCOUNTER — Other Ambulatory Visit: Payer: Self-pay | Admitting: Family Medicine

## 2024-03-24 DIAGNOSIS — K219 Gastro-esophageal reflux disease without esophagitis: Secondary | ICD-10-CM

## 2024-04-10 ENCOUNTER — Other Ambulatory Visit: Payer: Self-pay | Admitting: Physician Assistant

## 2024-04-10 ENCOUNTER — Other Ambulatory Visit: Payer: Self-pay | Admitting: Family Medicine

## 2024-04-10 DIAGNOSIS — M545 Low back pain, unspecified: Secondary | ICD-10-CM

## 2024-04-10 MED ORDER — CYCLOBENZAPRINE HCL 10 MG PO TABS: 10.0000 mg | ORAL_TABLET | Freq: Every day | ORAL | 0 refills | Status: DC

## 2024-04-22 ENCOUNTER — Telehealth: Payer: Self-pay

## 2024-04-22 NOTE — Telephone Encounter (Signed)
 PA initiated

## 2024-04-22 NOTE — Telephone Encounter (Signed)
 Prior Auth on tirzepatide  (ZEPBOUND ) 5 MG/0.5ML Pen   Key [id number]

## 2024-04-26 ENCOUNTER — Ambulatory Visit (INDEPENDENT_AMBULATORY_CARE_PROVIDER_SITE_OTHER): Admitting: Family Medicine

## 2024-04-26 DIAGNOSIS — G4709 Other insomnia: Secondary | ICD-10-CM

## 2024-04-26 DIAGNOSIS — Q782 Osteopetrosis: Secondary | ICD-10-CM

## 2024-04-26 DIAGNOSIS — K219 Gastro-esophageal reflux disease without esophagitis: Secondary | ICD-10-CM | POA: Diagnosis not present

## 2024-04-26 DIAGNOSIS — E8881 Metabolic syndrome: Secondary | ICD-10-CM

## 2024-04-26 DIAGNOSIS — Z90710 Acquired absence of both cervix and uterus: Secondary | ICD-10-CM | POA: Diagnosis not present

## 2024-04-26 DIAGNOSIS — G4761 Periodic limb movement disorder: Secondary | ICD-10-CM

## 2024-04-26 DIAGNOSIS — G8929 Other chronic pain: Secondary | ICD-10-CM

## 2024-04-26 DIAGNOSIS — M545 Low back pain, unspecified: Secondary | ICD-10-CM

## 2024-04-26 MED ORDER — ZEPBOUND 5 MG/0.5ML ~~LOC~~ SOAJ: 5.0000 mg | SUBCUTANEOUS | 0 refills | Status: DC

## 2024-04-26 MED ORDER — TIRZEPATIDE-WEIGHT MANAGEMENT 2.5 MG/0.5ML ~~LOC~~ SOLN: 2.5000 mg | SUBCUTANEOUS | 0 refills | Status: DC

## 2024-04-26 MED ORDER — PRAMIPEXOLE DIHYDROCHLORIDE 0.5 MG PO TABS: 0.5000 mg | ORAL_TABLET | Freq: Every day | ORAL | 0 refills | Status: DC

## 2024-04-26 MED ORDER — TIRZEPATIDE-WEIGHT MANAGEMENT 7.5 MG/0.5ML ~~LOC~~ SOLN: 7.5000 mg | SUBCUTANEOUS | 0 refills | Status: DC

## 2024-04-26 MED ORDER — PANTOPRAZOLE SODIUM 40 MG PO TBEC: 40.0000 mg | DELAYED_RELEASE_TABLET | Freq: Every day | ORAL | 0 refills | Status: DC

## 2024-04-26 MED ORDER — QUVIVIQ 25 MG PO TABS: 25.0000 mg | ORAL_TABLET | Freq: Every day | ORAL | 0 refills | Status: DC

## 2024-04-26 MED ORDER — CYCLOBENZAPRINE HCL 10 MG PO TABS: 10.0000 mg | ORAL_TABLET | Freq: Every day | ORAL | 0 refills | Status: DC

## 2024-04-26 NOTE — Progress Notes (Signed)
 Name: Connie Sims   MRN: [id number]    DOB: 1981-02-06   Date:04/26/2024       Progress Note  Subjective  Chief Complaint  Chief Complaint  Patient presents with   Medication Refill   Medical Management of Chronic Issues   Discussed the use of AI scribe software for clinical note transcription with the patient, who gave verbal consent to proceed.  History of Present Illness Connie Sims is a 43 year old female with obesity and metabolic syndrome who presents for follow-up on weight management and medication issues.  She has struggled with obesity since her teenage years, with her current weight at 277 pounds. She has metabolic syndrome and a history of polycystic ovary syndrome (PCOS). Her A1c has been slightly elevated, and she has fatty liver and high triglycerides. She has attempted various weight loss methods, including intermittent fasting and meal programs, and has tried to reduce soda intake, though she has relapsed. She was prescribed Zepbound  for weight loss but has faced issues with pharmacy refills, preventing consistent use. She initially started on Ozempic  in June 2021 but last year was  switched to Zepbound  due to insurance requirements but has not gone over 5 mg and not more than one month of 5 mg dose which she has been out for a couple of months now. She has not experienced any side effects from Zepbound  when she was able to take it.  She experiences gastroesophageal reflux disease (GERD) symptoms, primarily at night, and has been on pantoprazole , which was effective. She is currently out of the medication and experiences heartburn and indigestion, especially when lying down. She manages symptoms by sleeping on her left side and avoiding eating before bed.  She has chronic low back pain and hip pain due to osteopetrosis. She takes cyclobenzaprine  at night to help relax her muscles, which she finds effective. She does not take any pain medication regularly.  She has  restless leg syndrome and takes pramipexole , which helps manage her symptoms effectively.  For sleep, she takes a medication Quviviq   that helps her fall and stay asleep, and she typically receives a 30-day supply.    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   Gastroesophageal reflux disease without esophagitis 05/31/2023   Metabolic syndrome 05/31/2023   Menorrhagia, premenopausal 05/31/2023   Iron  deficiency anemia due to chronic blood loss 05/31/2023   Right ovarian cyst 06/27/2018   Uterine leiomyoma 06/14/2018   Wheat intolerance 02/15/2018   Dysfunctional uterine bleeding 06/29/2017   Highly echogenic liver on ultrasound 06/15/2017   PLMD (periodic limb movement disorder) 02/01/2016   Microcytosis 02/01/2016   Vitamin D  deficiency 02/01/2016   Elevated alanine aminotransferase (ALT) level 02/01/2016   Hypertriglyceridemia 10/23/2015   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   PCO (polycystic ovaries)    Allergic rhinitis    Class 3 severe obesity with serious comorbidity and body mass index (BMI) of 40.0 to 44.9 in adult    Insomnia    Low back pain    Impaired fasting glucose     Past Surgical History:  Procedure Laterality Date   NASAL SINUS SURGERY     ROBOTIC ASSISTED LAPAROSCOPIC HYSTERECTOMY AND SALPINGECTOMY Bilateral 01/02/2024   Procedure: XI ROBOTIC ASSISTED LAPAROSCOPIC HYSTERECTOMY AND BILATERAL SALPINGECTOMY;  Surgeon: Derrel Flies, MD;  Location: [facility];  Service: Gynecology;  Laterality: Bilateral;   SENTINEL NODE BIOPSY N/A 01/02/2024   Procedure: BILATERAL SENTINEL NODE BIOPSY;  Surgeon: Derrel Flies,  MD;  Location: [facility];  Service: Gynecology;  Laterality: N/A;    Family History  Problem Relation Age of Onset   Liver disease Mother        fatty liver, s/p liver biopsy   Alcohol abuse Mother    Uterine cancer Maternal Aunt    Hypertension Maternal Grandmother    Glaucoma Maternal Grandmother     Diabetes Maternal Grandfather    Heart disease Maternal Grandfather    Breast cancer Neg Hx    Ovarian cancer Neg Hx    Colon cancer Neg Hx    Pancreatic cancer Neg Hx    Prostate cancer Neg Hx     Social History   Tobacco Use   Smoking status: Never   Smokeless tobacco: Never  Substance Use Topics   Alcohol use: No    Alcohol/week: 0.0 standard drinks of alcohol     Current Outpatient Medications:    cyclobenzaprine  (FLEXERIL ) 10 MG tablet, Take 1 tablet (10 mg total) by mouth at bedtime., Disp: 30 tablet, Rfl: 0   Daridorexant  HCl (QUVIVIQ ) 25 MG TABS, Take 1 tablet (25 mg total) by mouth at bedtime., Disp: 30 tablet, Rfl: 3   ferrous sulfate  325 (65 FE) MG tablet, TAKE 325 MG BY MOUTH 3 (THREE) TIMES DAILY. (Patient taking differently: daily. Once daily), Disp: 90 tablet, Rfl: 0   pantoprazole  (PROTONIX ) 40 MG tablet, TAKE 1 TABLET BY MOUTH DAILY BEFORE BREAKFAST, Disp: 30 tablet, Rfl: 0   tirzepatide  (ZEPBOUND ) 5 MG/0.5ML Pen, Inject 5 mg into the skin once a week., Disp: 2 mL, Rfl: 0   pramipexole  (MIRAPEX ) 0.5 MG tablet, TAKE 1 TABLET BY MOUTH AT BEDTIME. (Patient not taking: Reported on 04/26/2024), Disp: 90 tablet, Rfl: 0   senna-docusate (SENOKOT-S) 8.6-50 MG tablet, Take 2 tablets by mouth at bedtime. For AFTER surgery, do not take if having diarrhea, Disp: 30 tablet, Rfl: 0  No Known Allergies  I personally reviewed active problem list, medication list, allergies, family history with the patient/caregiver today.   ROS  Ten systems reviewed and is negative except as mentioned in HPI    Objective Physical Exam Constitutional: Patient appears well-developed and well-nourished. Obese  No distress.  HEENT: head atraumatic, normocephalic, pupils equal and reactive to light, neck supple Cardiovascular: Normal rate, regular rhythm and normal heart sounds.  No murmur heard. Non pitting  BLE edema. Pulmonary/Chest: Effort normal and breath sounds normal. No respiratory  distress. Abdominal: Soft.  There is no tenderness. Psychiatric: Patient has a normal mood and affect. behavior is normal. Judgment and thought content normal.   Vitals:   04/26/24 1049  BP: 118/72  Pulse: 98  Resp: 16  SpO2: 95%  Weight: 277 lb 1.6 oz (125.7 kg)  Height: 5\' 5"  (1.651 m)    Body mass index is 46.11 kg/m.  No results found for this or any previous visit (from the past 2160 hours).   PHQ2/9:    04/26/2024   10:48 AM 10/19/2023    3:47 PM 10/19/2023    8:25 AM 10/11/2023    1:48 PM 10/10/2023    1:13 PM  Depression screen PHQ 2/9  Decreased Interest 0 0 0 0 0  Down, Depressed, Hopeless 0 0 0 0 0  PHQ - 2 Score 0 0 0 0 0  Altered sleeping 0 2 0 1 3  Tired, decreased energy 0 0 0 1 1  Change in appetite 0 0 0 0 0  Feeling bad or failure about yourself  0 0 0 0 0  Trouble concentrating 0 0 0 0 0  Moving slowly or fidgety/restless 0 0 0 0 0  Suicidal thoughts 0 0 0 0 0  PHQ-9 Score 0 2 0 2 4  Difficult doing work/chores Not difficult at all    Somewhat difficult    phq 9 is negative  Fall Risk:    10/10/2023    1:12 PM 05/31/2023    9:11 AM 10/28/2022    8:42 AM 04/04/2022    9:41 AM 03/07/2022   10:23 AM  Fall Risk   Falls in the past year? 0 0 0 0 0  Number falls in past yr:   0 0 0  Injury with Fall?   0 0   Risk for fall due to : No Fall Risks No Fall Risks No Fall Risks    Follow up Falls prevention discussed Falls prevention discussed Falls prevention discussed;Education provided;Falls evaluation completed Falls evaluation completed Falls evaluation completed      Assessment & Plan Morbid Obesity  Obesity with metabolic syndrome and PCOS. Weight 277.1 lbs. Insurance requires 5% weight loss for continued coverage. - Prescribe Zepbound  with titration: 2.5 mg, 5 mg, and 7.5 mg. - Advise dietary changes, specifically reducing sugary beverages. - Schedule follow-up in three months to assess weight loss and medication efficacy.  Metabolic  syndrome Metabolic syndrome with elevated A1c and triglycerides. Intolerance to metformin  and pioglitazone . Managed with GLP-1 agonist therapy. - Continue Zepbound  as part of obesity management plan.  Gastroesophageal reflux disease (GERD) GERD with heartburn and indigestion, worsens when lying down and with late meals and sugary beverages. - Prescribe pantoprazole  for 90 days. - Advise taking pantoprazole  before the last meal of the day. - Recommend lifestyle modifications: avoid eating and drinking before bed, elevate head of bed, and reduce soda intake.  Chronic low back pain Chronic low back pain associated with osteopetrosis. Managed with cyclobenzaprine . - Continue cyclobenzaprine  at bedtime for muscle relaxation.  Osteopetrosis Osteopetrosis contributing to chronic low back pain. - Continue current management with cyclobenzaprine  for associated muscle pain.  Insomnia -continue Quviviq    Restless legs syndrome Restless legs syndrome managed with pramipexole , effectively controlling symptoms. - Continue pramipexole  for restless legs syndrome.

## 2024-05-08 ENCOUNTER — Ambulatory Visit: Admitting: Family Medicine

## 2024-06-03 ENCOUNTER — Ambulatory Visit (INDEPENDENT_AMBULATORY_CARE_PROVIDER_SITE_OTHER): Admitting: Family Medicine

## 2024-06-03 ENCOUNTER — Encounter: Payer: Self-pay | Admitting: Family Medicine

## 2024-06-03 VITALS — BP 102/68 | HR 98 | Resp 16 | Ht 64.0 in | Wt 278.0 lb

## 2024-06-03 DIAGNOSIS — E8881 Metabolic syndrome: Secondary | ICD-10-CM | POA: Diagnosis not present

## 2024-06-03 DIAGNOSIS — N6312 Unspecified lump in the right breast, upper inner quadrant: Secondary | ICD-10-CM

## 2024-06-03 DIAGNOSIS — Z1231 Encounter for screening mammogram for malignant neoplasm of breast: Secondary | ICD-10-CM | POA: Diagnosis not present

## 2024-06-03 DIAGNOSIS — E785 Hyperlipidemia, unspecified: Secondary | ICD-10-CM

## 2024-06-03 DIAGNOSIS — D229 Melanocytic nevi, unspecified: Secondary | ICD-10-CM

## 2024-06-03 DIAGNOSIS — E559 Vitamin D deficiency, unspecified: Secondary | ICD-10-CM

## 2024-06-03 DIAGNOSIS — Z862 Personal history of diseases of the blood and blood-forming organs and certain disorders involving the immune mechanism: Secondary | ICD-10-CM | POA: Diagnosis not present

## 2024-06-03 DIAGNOSIS — Z0000 Encounter for general adult medical examination without abnormal findings: Secondary | ICD-10-CM | POA: Diagnosis not present

## 2024-06-03 NOTE — Progress Notes (Signed)
 Name: Connie Sims   MRN: [id number]    DOB: 28-Aug-1981   Date:06/03/2024       Progress Note  Subjective  Chief Complaint  Chief Complaint  Patient presents with   Annual Exam    HPI  Patient presents for annual CPE.  Diet: eats at the cafeteria at work, cooks at home, she is trying to cut down on sodas. She is currently drinking 3 cans a day  Exercise: only walking at work   Last Eye Exam: she will schedule it  Last Dental Exam: not seen a dentist since before COVID  Flowsheet Row [facility] Visit from [facility]  AUDIT-C Score 0      Depression: Phq 9 is  negative    06/03/2024   10:41 AM 04/26/2024   10:48 AM 10/19/2023    3:47 PM 10/19/2023    8:25 AM 10/11/2023    1:48 PM  Depression screen PHQ 2/9  Decreased Interest 0 0 0 0 0  Down, Depressed, Hopeless 0 0 0 0 0  PHQ - 2 Score 0 0 0 0 0  Altered sleeping 0 0 2 0 1  Tired, decreased energy 0 0 0 0 1  Change in appetite 0 0 0 0 0  Feeling bad or failure about yourself  0 0 0 0 0  Trouble concentrating 0 0 0 0 0  Moving slowly or fidgety/restless 0 0 0 0 0  Suicidal thoughts 0 0 0 0 0  PHQ-9 Score 0 0 2 0 2  Difficult doing work/chores Not difficult at all Not difficult at all      Hypertension: BP Readings from Last 3 Encounters:  06/03/24 102/68  04/26/24 118/72  01/15/24 127/81   Obesity: Wt Readings from Last 3 Encounters:  06/03/24 278 lb (126.1 kg)  04/26/24 277 lb 1.6 oz (125.7 kg)  01/15/24 271 lb 9.6 oz (123.2 kg)   BMI Readings from Last 3 Encounters:  06/03/24 47.72 kg/m  04/26/24 46.11 kg/m  01/15/24 45.20 kg/m     Vaccines: reviewed with the patient.   Hep C Screening: completed STD testing and prevention (HIV/chl/gon/syphilis): N/A Intimate partner violence: negative screen  Sexual History : not currently  Menstrual History/LMP/Abnormal Bleeding: s/p hysterectomy  Discussed importance of follow up if any post-menopausal bleeding: not  applicable  Incontinence Symptoms: positive for mild stress  symptoms   Breast cancer:  - Last Mammogram: 10/2023 - BRCA gene screening: N/A  Osteoporosis Prevention : Discussed high calcium and vitamin D  supplementation, weight bearing exercises Bone density :not applicable   Cervical cancer screening: not applicable due to hysterectomy  Skin cancer: Discussed monitoring for atypical lesions  Colorectal cancer: start at age 45   Lung cancer:  Low Dose CT Chest recommended if Age 13-80 years, 20 pack-year currently smoking OR have quit w/in 15years. Patient does not qualify for screen   ECG: consider if lipid panel remains abnormal   Advanced Care Planning: A voluntary discussion about advance care planning including the explanation and discussion of advance directives.  Discussed health care proxy and Living will, and the patient was able to identify a health care proxy as husband .  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   Gastroesophageal reflux disease  without esophagitis 05/31/2023   Metabolic syndrome 05/31/2023   Iron  deficiency anemia due to chronic blood loss 05/31/2023   Right ovarian cyst 06/27/2018   Wheat intolerance 02/15/2018   PLMD (periodic limb movement disorder) 02/01/2016   Microcytosis 02/01/2016   Vitamin D  deficiency 02/01/2016   Elevated alanine aminotransferase (ALT) level 02/01/2016   Hypertriglyceridemia 10/23/2015   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   PCO (polycystic ovaries)    Allergic rhinitis    Class 3 severe obesity with serious comorbidity and body mass index (BMI) of 40.0 to 44.9 in adult    Insomnia    Low back pain     Past Surgical History:  Procedure Laterality Date   ABDOMINAL HYSTERECTOMY  Jan 02, 2024    NASAL SINUS SURGERY     ROBOTIC ASSISTED LAPAROSCOPIC HYSTERECTOMY AND SALPINGECTOMY Bilateral 01/02/2024   Procedure: XI ROBOTIC ASSISTED LAPAROSCOPIC HYSTERECTOMY AND BILATERAL SALPINGECTOMY;  Surgeon: Derrel Flies, MD;  Location: [facility];  Service: Gynecology;  Laterality: Bilateral;   SENTINEL NODE BIOPSY N/A 01/02/2024   Procedure: BILATERAL SENTINEL NODE BIOPSY;  Surgeon: Derrel Flies, MD;  Location: [facility];  Service: Gynecology;  Laterality: N/A;    Family History  Problem Relation Age of Onset   Liver disease Mother        fatty liver, s/p liver biopsy   Alcohol abuse Mother    Uterine cancer Maternal Aunt    Hypertension Maternal Grandmother    Glaucoma Maternal Grandmother    Diabetes Maternal Grandfather    Heart disease Maternal Grandfather    Breast cancer Neg Hx    Ovarian cancer Neg Hx    Colon cancer Neg Hx    Pancreatic cancer Neg Hx    Prostate cancer Neg Hx     Social History   Socioeconomic History   Marital status: Married    Spouse name: Not on file   Number of children: 0   Years of education: Masters   Highest education level: Master's degree (e.g., MA, MS, MEng, MEd, MSW, MBA)  Occupational History   Occupation: Art gallery manager   Tobacco Use   Smoking status: Never   Smokeless tobacco: Never  Vaping Use   Vaping status: Never Used  Substance and Sexual Activity   Alcohol use: Never   Drug use: Never   Sexual activity: Not Currently    Birth control/protection: None    Comment: Hysterectomy  Other Topics Concern   Not on file  Social History Narrative   Drinks 2- 20oz pepsi a day   Works for [employer] as an Art gallery manager    She was at [employer] band   She plays the saxophone    Dating someone from [place]    Social Drivers of Health   Financial Resource Strain: Low Risk  (06/03/2024)   Overall Financial Resource Strain (CARDIA)    Difficulty of Paying Living Expenses: Not hard at all  Food Insecurity: No Food Insecurity (06/03/2024)   Hunger  Vital Sign    Worried About Running Out of Food in the Last Year: Never true    Ran Out of Food in the Last Year: Never true  Transportation Needs: No Transportation Needs (04/26/2024)   PRAPARE - Administrator, Civil Service (Medical): No    Lack of Transportation (Non-Medical): No  Physical Activity: Inactive (06/03/2024)   Exercise Vital Sign    Days of Exercise per Week: 0 days    Minutes of Exercise per Session: 0 min  Stress: No Stress Concern Present (06/03/2024)  [employer] of Occupational Health - Occupational Stress Questionnaire    Feeling of Stress : Only a little  Social Connections: Unknown (04/26/2024)   Social Connection and Isolation Panel [NHANES]    Frequency of Communication with Friends and Family: Patient declined    Frequency of Social Gatherings with Friends and Family: Patient declined    Attends Religious Services: Patient declined    Database administrator or Organizations: Yes    Attends Engineer, structural: More than 4 times per year    Marital Status: Married  Catering manager Violence: Not At Risk (06/03/2024)   Humiliation, Afraid, Rape, and Kick questionnaire    Fear of Current or Ex-Partner: No    Emotionally Abused: No    Physically Abused: No    Sexually Abused: No     Current Outpatient Medications:    cyclobenzaprine  (FLEXERIL ) 10 MG tablet, Take 1 tablet (10 mg total) by mouth at bedtime., Disp: 90 tablet, Rfl: 0   Daridorexant  HCl (QUVIVIQ ) 25 MG TABS, Take 1 tablet (25 mg total) by mouth at bedtime., Disp: 90 tablet, Rfl: 0   pantoprazole  (PROTONIX ) 40 MG tablet, Take 1 tablet (40 mg total) by mouth daily before breakfast., Disp: 90 tablet, Rfl: 0   pramipexole  (MIRAPEX ) 0.5 MG tablet, Take 1 tablet (0.5 mg total) by mouth at bedtime., Disp: 90 tablet, Rfl: 0   tirzepatide  (ZEPBOUND ) 5 MG/0.5ML Pen, Inject 5 mg into the skin once a week., Disp: 2 mL, Rfl: 0   tirzepatide  (ZEPBOUND ) 2.5 MG/0.5ML injection vial, Inject  2.5 mg into the skin once a week. (Patient not taking: Reported on 06/03/2024), Disp: 2 mL, Rfl: 0   tirzepatide  7.5 MG/0.5ML injection vial, Inject 7.5 mg into the skin once a week. (Patient not taking: Reported on 06/03/2024), Disp: 2 mL, Rfl: 0  No Known Allergies   ROS  Constitutional: Negative for fever or weight change.  Respiratory: Negative for cough and shortness of breath.   Cardiovascular: Negative for chest pain or palpitations.  Gastrointestinal: Negative for abdominal pain, no bowel changes.  Musculoskeletal: Negative for gait problem or joint swelling.  Skin: Negative for rash.  Neurological: Negative for dizziness or headache.  No other specific complaints in a complete review of systems (except as listed in HPI above).   Objective  Vitals:   06/03/24 1043  BP: 102/68  Pulse: 98  Resp: 16  SpO2: 95%  Weight: 278 lb (126.1 kg)  Height: 5\' 4"  (1.626 m)    Body mass index is 47.72 kg/m.  Physical Exam  Constitutional: Patient appears well-developed and well-nourished. No distress.  HENT: Head: Normocephalic and atraumatic. Ears: B TMs ok, no erythema or effusion; Nose: Nose normal. Mouth/Throat: Oropharynx is clear and moist. No oropharyngeal exudate.  Eyes: Conjunctivae and EOM are normal. Pupils are equal, round, and reactive to light. No scleral icterus.  Neck: Normal range of motion. Neck supple. No JVD present. No thyromegaly present.  Cardiovascular: Normal rate, regular rhythm and normal heart sounds.  No murmur heard. No BLE edema. Pulmonary/Chest: Effort normal and breath sounds normal. No respiratory distress. Abdominal: Soft. Bowel sounds are normal, no distension. There is no tenderness. no masses Breast: lumpy breast but a round nodule at 1 o'clock right breast about 3 inches from areola, no nipple discharge or rashes FEMALE GENITALIA:  Not done  RECTAL: not done  Musculoskeletal: Normal range of motion, no joint effusions. No gross  deformities Neurological: he is alert and oriented to person, place, and  time. No cranial nerve deficit. Coordination, balance, strength, speech and gait are normal.  Skin: Skin is warm and dry. multiple atypical moles , specially on her back  Psychiatric: Patient has a normal mood and affect. behavior is normal. Judgment and thought content normal.     Assessment & Plan  1. Well adult exam (Primary)  - MM 3D SCREENING MAMMOGRAM BILATERAL BREAST; Future - Lipid panel - Comprehensive metabolic panel with GFR - CBC with Differential/Platelet - Iron , TIBC and Ferritin Panel - Hemoglobin A1c - VITAMIN D  25 Hydroxy (Vit-D Deficiency, Fractures)  2. Encounter for screening mammogram for malignant neoplasm of breast  - MM 3D SCREENING MAMMOGRAM BILATERAL BREAST; Future  3. Metabolic syndrome  - Comprehensive metabolic panel with GFR - Hemoglobin A1c  4. History of iron  deficiency anemia  - CBC with Differential/Platelet - Iron , TIBC and Ferritin Panel  5. Vitamin D  deficiency  - VITAMIN D  25 Hydroxy (Vit-D Deficiency, Fractures)  6. Dyslipidemia  - Lipid panel - Comprehensive metabolic panel with GFR   7. Multiple atypical skin moles  - Ambulatory referral to Dermatology  8. Breast lump on right side at 1 o'clock position  - MM 3D DIAGNOSTIC MAMMOGRAM BILATERAL BREAST; Future - US  LIMITED ULTRASOUND INCLUDING AXILLA RIGHT BREAST; Future   -USPSTF grade A and B recommendations reviewed with patient; age-appropriate recommendations, preventive care, screening tests, etc discussed and encouraged; healthy living encouraged; see AVS for patient education given to patient -Discussed importance of 150 minutes of physical activity weekly, eat two servings of fish weekly, eat one serving of tree nuts ( cashews, pistachios, pecans, almonds.Aaron Aas) every other day, eat 6 servings of fruit/vegetables daily and drink plenty of water  and avoid sweet beverages.   -Reviewed Health  Maintenance: Yes.

## 2024-06-04 ENCOUNTER — Ambulatory Visit: Payer: Self-pay | Admitting: Family Medicine

## 2024-06-04 LAB — COMPREHENSIVE METABOLIC PANEL WITH GFR
AG Ratio: 1.4 (calc) (ref 1.0–2.5)
ALT: 44 U/L — ABNORMAL HIGH (ref 6–29)
AST: 26 U/L (ref 10–30)
Albumin: 4.2 g/dL (ref 3.6–5.1)
Alkaline phosphatase (APISO): 112 U/L (ref 31–125)
BUN: 11 mg/dL (ref 7–25)
CO2: 26 mmol/L (ref 20–32)
Calcium: 9.3 mg/dL (ref 8.6–10.2)
Chloride: 102 mmol/L (ref 98–110)
Creat: 0.83 mg/dL (ref 0.50–0.99)
Globulin: 2.9 g/dL (ref 1.9–3.7)
Glucose, Bld: 95 mg/dL (ref 65–99)
Potassium: 4.7 mmol/L (ref 3.5–5.3)
Sodium: 138 mmol/L (ref 135–146)
Total Bilirubin: 0.4 mg/dL (ref 0.2–1.2)
Total Protein: 7.1 g/dL (ref 6.1–8.1)
eGFR: 89 mL/min/{1.73_m2} (ref >=60)

## 2024-06-04 LAB — CBC WITH DIFFERENTIAL/PLATELET
Absolute Lymphocytes: 1620 {cells}/uL (ref 850–3900)
Absolute Monocytes: 481 {cells}/uL (ref 200–950)
Basophils Absolute: 53 {cells}/uL (ref 0–200)
Basophils Relative: 0.6 %
Eosinophils Absolute: 205 {cells}/uL (ref 15–500)
Eosinophils Relative: 2.3 %
HCT: 41.1 % (ref 35.0–45.0)
Hemoglobin: 12.8 g/dL (ref 11.7–15.5)
MCH: 26 pg — ABNORMAL LOW (ref 27.0–33.0)
MCHC: 31.1 g/dL — ABNORMAL LOW (ref 32.0–36.0)
MCV: 83.4 fL (ref 80.0–100.0)
MPV: 10 fL (ref 7.5–12.5)
Monocytes Relative: 5.4 %
Neutro Abs: 6542 {cells}/uL (ref 1500–7800)
Neutrophils Relative %: 73.5 %
Platelets: 386 10*3/uL (ref 140–400)
RBC: 4.93 10*6/uL (ref 3.80–5.10)
RDW: 15.7 % — ABNORMAL HIGH (ref 11.0–15.0)
Total Lymphocyte: 18.2 %
WBC: 8.9 10*3/uL (ref 3.8–10.8)

## 2024-06-04 LAB — LIPID PANEL
Cholesterol: 184 mg/dL (ref <200)
HDL: 49 mg/dL — ABNORMAL LOW (ref >=50)
LDL Cholesterol (Calc): 107 mg/dL — ABNORMAL HIGH
Non-HDL Cholesterol (Calc): 135 mg/dL — ABNORMAL HIGH (ref <130)
Total CHOL/HDL Ratio: 3.8 (calc) (ref <5.0)
Triglycerides: 165 mg/dL — ABNORMAL HIGH (ref <150)

## 2024-06-04 LAB — IRON,TIBC AND FERRITIN PANEL
%SAT: 10 % — ABNORMAL LOW (ref 16–45)
Ferritin: 60 ng/mL (ref 16–232)
Iron: 47 ug/dL (ref 40–190)
TIBC: 459 ug/dL — ABNORMAL HIGH (ref 250–450)

## 2024-06-04 LAB — HEMOGLOBIN A1C
Hgb A1c MFr Bld: 6 % — ABNORMAL HIGH (ref <5.7)
Mean Plasma Glucose: 126 mg/dL
eAG (mmol/L): 7 mmol/L

## 2024-06-04 LAB — VITAMIN D 25 HYDROXY (VIT D DEFICIENCY, FRACTURES): Vit D, 25-Hydroxy: 22 ng/mL — ABNORMAL LOW (ref 30–100)

## 2024-06-29 ENCOUNTER — Encounter: Payer: Self-pay | Admitting: Family Medicine

## 2024-07-01 ENCOUNTER — Other Ambulatory Visit (HOSPITAL_COMMUNITY): Payer: Self-pay

## 2024-07-01 ENCOUNTER — Telehealth: Payer: Self-pay | Admitting: Pharmacy Technician

## 2024-07-01 NOTE — Telephone Encounter (Signed)
 Pharmacy Patient Advocate Encounter   Received notification from Patient Advice Request messages that prior authorization for Zepbound  5MG /0.5ML pen-injectors is required/requested.   Insurance verification completed.   The patient is insured through [facility] .   Per test claim: PA required; PA submitted to above mentioned insurance via CoverMyMeds Key/confirmation #/EOC [id number] Status is pending

## 2024-07-01 NOTE — Telephone Encounter (Signed)
 Submitted and pending

## 2024-07-01 NOTE — Telephone Encounter (Signed)
 Pharmacy Patient Advocate Encounter  Received notification from [employer] that Prior Authorization for Zepbound  5MG /0.5ML pen-injectors  has been DENIED.  Full denial letter will be uploaded to the media tab. See denial reason below.      PA #/Case ID/Reference #: : [id number]

## 2024-07-05 ENCOUNTER — Other Ambulatory Visit (HOSPITAL_COMMUNITY): Payer: Self-pay

## 2024-07-15 ENCOUNTER — Other Ambulatory Visit: Payer: Self-pay | Admitting: Family Medicine

## 2024-07-15 DIAGNOSIS — N6312 Unspecified lump in the right breast, upper inner quadrant: Secondary | ICD-10-CM

## 2024-07-17 ENCOUNTER — Encounter: Payer: Self-pay | Admitting: Family Medicine

## 2024-07-17 DIAGNOSIS — D229 Melanocytic nevi, unspecified: Secondary | ICD-10-CM

## 2024-08-06 ENCOUNTER — Ambulatory Visit: Admitting: Family Medicine

## 2024-08-23 ENCOUNTER — Encounter: Payer: Self-pay | Admitting: Family Medicine

## 2024-08-23 ENCOUNTER — Ambulatory Visit (INDEPENDENT_AMBULATORY_CARE_PROVIDER_SITE_OTHER): Admitting: Family Medicine

## 2024-08-23 DIAGNOSIS — G4761 Periodic limb movement disorder: Secondary | ICD-10-CM

## 2024-08-23 DIAGNOSIS — E8881 Metabolic syndrome: Secondary | ICD-10-CM

## 2024-08-23 DIAGNOSIS — M545 Low back pain, unspecified: Secondary | ICD-10-CM

## 2024-08-23 DIAGNOSIS — K219 Gastro-esophageal reflux disease without esophagitis: Secondary | ICD-10-CM

## 2024-08-23 DIAGNOSIS — E559 Vitamin D deficiency, unspecified: Secondary | ICD-10-CM

## 2024-08-23 DIAGNOSIS — G8929 Other chronic pain: Secondary | ICD-10-CM

## 2024-08-23 DIAGNOSIS — G4709 Other insomnia: Secondary | ICD-10-CM

## 2024-08-23 DIAGNOSIS — E785 Hyperlipidemia, unspecified: Secondary | ICD-10-CM

## 2024-08-23 MED ORDER — CYCLOBENZAPRINE HCL 10 MG PO TABS: 10.0000 mg | ORAL_TABLET | Freq: Every day | ORAL | 0 refills | Status: DC

## 2024-08-23 MED ORDER — ZEPBOUND 5 MG/0.5ML ~~LOC~~ SOAJ: 5.0000 mg | SUBCUTANEOUS | 0 refills | Status: DC

## 2024-08-23 MED ORDER — QUVIVIQ 25 MG PO TABS: 25.0000 mg | ORAL_TABLET | Freq: Every day | ORAL | 0 refills | Status: DC

## 2024-08-23 MED ORDER — PRAMIPEXOLE DIHYDROCHLORIDE 0.5 MG PO TABS: 0.5000 mg | ORAL_TABLET | Freq: Every day | ORAL | 0 refills | Status: DC

## 2024-08-23 NOTE — Progress Notes (Signed)
 Name: Connie Sims   MRN: [id number]    DOB: 02-18-1981   Date:08/23/2024       Progress Note  Subjective  Chief Complaint  Chief Complaint  Patient presents with   Medical Management of Chronic Issues    Unable to get 7.5 dose due to insurance not covering it   Discussed the use of AI scribe software for clinical note transcription with the patient, who gave verbal consent to proceed.  History of Present Illness Connie Sims is a 43 year old female with obesity, metabolic syndrome, and polycystic ovarian syndrome who presents for a follow-up on weight management.  She has struggled with obesity since age 16. Her weight was 277 pounds in May, and she has faced challenges with weight management. She has been on and off medications like Zepbound  and Ozempic  since June of last year, with inconsistent access due to insurance issues. Despite these challenges, she is trying her best to continue the weight loss journey by  recently by reducing soda intake and monitoring her diet. Her current weight is 286.9 pounds.  She has a history of metabolic syndrome, polycystic ovarian syndrome, prediabetes, fatty liver disease, and high triglycerides. She has attempted various weight loss strategies, including intermittent fasting and meal prepping, but often relapses. Her diet includes avoiding fried foods and considering intermittent fasting again. She typically has breakfast at work, consisting of eggs, bacon, and a biscuit, and is considering cutting out the biscuit to reduce carbs. Lunch is usually a vegetable and protein, and dinner is often chicken and vegetables, as she cannot tolerate [place].  She experiences gastroesophageal reflux, which is generally well-controlled unless she consumes tomato-based foods. She takes pantoprazole  as needed for reflux. She has periodic leg movements that affect her sleep, for which she takes Mirapex  as needed to help her sleep. She also uses cyclobenzaprine  for  chronic back pain without sciatica, which she finds helpful.  She is not currently taking medication for dyslipidemia but is managing it through diet. She has been inconsistent with her vitamin D  supplementation. For sleep, she occasionally uses Quviviq   when she has difficulty turning her mind off at night. She consumes two cans of regular Pepsi daily and has tried [employer], but it upsets her stomach. She has a Mudlogger at home and is considering using it to reduce sugar intake by mixing with club soda.    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   PCO (polycystic ovaries)    Allergic rhinitis    Class 3 severe obesity with serious comorbidity and body mass index (BMI) of 40.0 to 44.9 in adult    Insomnia    Low back pain     Past Surgical History:  Procedure Laterality Date   ABDOMINAL HYSTERECTOMY  Jan 02, 2024   NASAL SINUS SURGERY     ROBOTIC ASSISTED LAPAROSCOPIC HYSTERECTOMY AND SALPINGECTOMY Bilateral 01/02/2024   Procedure: XI ROBOTIC ASSISTED LAPAROSCOPIC HYSTERECTOMY AND BILATERAL SALPINGECTOMY;  Surgeon: Eldonna Mays, MD;  Location:  [facility];  Service: Gynecology;  Laterality: Bilateral;   SENTINEL NODE BIOPSY N/A 01/02/2024   Procedure: BILATERAL SENTINEL NODE BIOPSY;  Surgeon: Eldonna Mays, MD;  Location: [facility];  Service: Gynecology;  Laterality: N/A;    Family History  Problem Relation Age of Onset    Liver disease Mother        fatty liver, s/p liver biopsy   Alcohol abuse Mother    Uterine cancer Maternal Aunt    Hypertension Maternal Grandmother    Glaucoma Maternal Grandmother    Diabetes Maternal Grandfather    Heart disease Maternal Grandfather    Breast cancer Neg Hx    Ovarian cancer Neg Hx    Colon cancer Neg Hx    Pancreatic cancer Neg Hx    Prostate cancer Neg Hx     Social History   Tobacco Use   Smoking status: Never   Smokeless tobacco: Never  Substance Use Topics   Alcohol use: Never     Current Outpatient Medications:    cyclobenzaprine  (FLEXERIL ) 10 MG tablet, Take 1 tablet (10 mg total) by mouth at bedtime., Disp: 90 tablet, Rfl: 0   Daridorexant  HCl (QUVIVIQ ) 25 MG TABS, Take 1 tablet (25 mg total) by mouth at bedtime., Disp: 90 tablet, Rfl: 0   pantoprazole  (PROTONIX ) 40 MG tablet, Take 1 tablet (40 mg total) by mouth daily before breakfast., Disp: 90 tablet, Rfl: 0   pramipexole  (MIRAPEX ) 0.5 MG tablet, Take 1 tablet (0.5 mg total) by mouth at bedtime., Disp: 90 tablet, Rfl: 0   tirzepatide  (ZEPBOUND ) 5 MG/0.5ML Pen, Inject 5 mg into the skin once a week., Disp: 2 mL, Rfl: 0   tirzepatide  7.5 MG/0.5ML injection vial, Inject 7.5 mg into the skin once a week. (Patient not taking: Reported on 08/23/2024), Disp: 2 mL, Rfl: 0  No Known Allergies  I personally reviewed active problem list, medication list, allergies, family history with the patient/caregiver today.   ROS  Ten systems reviewed and is negative except as mentioned in HPI    Objective Physical Exam CONSTITUTIONAL: Patient appears well-developed and well-nourished.  No distress. HEENT: Head atraumatic, normocephalic, neck supple. CARDIOVASCULAR: Normal rate, regular rhythm and normal heart sounds.  No murmur heard. No BLE edema. PULMONARY: Effort normal and breath sounds normal. No respiratory distress. ABDOMINAL: There is no tenderness or distention. MUSCULOSKELETAL: Normal gait.  Without gross motor or sensory deficit. PSYCHIATRIC: Patient has a normal mood and affect. behavior is normal. Judgment and thought content normal.  Vitals:   08/23/24 1039  BP: 114/74  Pulse: 91  Resp: 16  SpO2: 96%  Weight: 286 lb 14.4 oz (130.1 kg)  Height: 5' 4 (1.626 m)    Body mass index is 49.25 kg/m.  Recent Results (from the past 2160 hours)  Lipid panel     Status: Abnormal   Collection Time: 06/03/24 11:22 AM  Result Value Ref Range   Cholesterol 184 <200 mg/dL   HDL 49 (L) > OR = 50 mg/dL   Triglycerides 834 (H) <150 mg/dL   LDL Cholesterol (Calc) 107 (H) mg/dL (calc)    Comment: Reference range: <100 . Desirable range <100 mg/dL for primary prevention;   <70 mg/dL for patients with CHD or diabetic patients  with > or = 2 CHD risk factors. SABRA LDL-C is now calculated using the Martin-Hopkins  calculation, which is a validated novel method providing  better accuracy than the Friedewald equation in the  estimation of LDL-C.  Gladis APPLETHWAITE et al.  JAMA. 7986;689(80): [phone]  (http://education.[facility].com/faq/FAQ164)    Total CHOL/HDL Ratio 3.8 <5.0 (calc)   Non-HDL Cholesterol (Calc) 135 (H) <130 mg/dL (calc)    Comment: For patients with diabetes plus 1 major ASCVD risk  factor, treating to a non-HDL-C goal of <100 mg/dL  (LDL-C of <29 mg/dL) is considered a therapeutic  option.   Comprehensive metabolic panel with GFR     Status: Abnormal   Collection Time: 06/03/24 11:22 AM  Result Value Ref Range   Glucose, Bld 95 65 - 99 mg/dL    Comment: .            Fasting reference interval .    BUN 11 7 - 25 mg/dL   Creat 9.16 9.49 - 9.00 mg/dL   eGFR 89 > OR = 60 fO/fpw/8.26f7   BUN/Creatinine Ratio SEE NOTE: 6 - 22 (calc)    Comment:    Not Reported: BUN and Creatinine are within    reference range. .    Sodium 138 135 - 146 mmol/L   Potassium 4.7 3.5 - 5.3 mmol/L   Chloride 102 98 - 110 mmol/L   CO2 26 20 - 32 mmol/L   Calcium 9.3 8.6 - 10.2  mg/dL   Total Protein 7.1 6.1 - 8.1 g/dL   Albumin 4.2 3.6 - 5.1 g/dL   Globulin 2.9 1.9 - 3.7 g/dL (calc)   AG Ratio 1.4 1.0 - 2.5 (calc)   Total Bilirubin 0.4 0.2 - 1.2 mg/dL   Alkaline phosphatase (APISO) 112 31 - 125 U/L   AST 26 10 - 30 U/L   ALT 44 (H) 6 - 29 U/L  CBC with Differential/Platelet     Status: Abnormal   Collection Time: 06/03/24 11:22 AM  Result Value Ref Range   WBC 8.9 3.8 - 10.8 Thousand/uL   RBC 4.93 3.80 - 5.10 Million/uL   Hemoglobin 12.8 11.7 - 15.5 g/dL   HCT 58.8 64.9 - 54.9 %   MCV 83.4 80.0 - 100.0 fL   MCH 26.0 (L) 27.0 - 33.0 pg   MCHC 31.1 (L) 32.0 - 36.0 g/dL    Comment: For adults, a slight decrease in the calculated MCHC value (in the range of 30 to 32 g/dL) is most likely not clinically significant; however, it should be interpreted with caution in correlation with other red cell parameters and the patient's clinical condition.    RDW 15.7 (H) 11.0 - 15.0 %   Platelets 386 140 - 400 Thousand/uL   MPV 10.0 7.5 - 12.5 fL   Neutro Abs 6,542 1,500 - 7,800 cells/uL   Absolute Lymphocytes 1,620 850 - 3,900 cells/uL   Absolute Monocytes 481 200 - 950 cells/uL   Eosinophils Absolute 205 15 - 500 cells/uL   Basophils Absolute 53 0 - 200 cells/uL   Neutrophils Relative % 73.5 %   Total Lymphocyte 18.2 %   Monocytes Relative 5.4 %   Eosinophils Relative 2.3 %   Basophils Relative 0.6 %  Iron , TIBC and Ferritin Panel     Status: Abnormal   Collection Time: 06/03/24 11:22 AM  Result Value Ref Range   Iron  47 40 - 190 mcg/dL   TIBC 540 (H) 250 - 450 mcg/dL (calc)   %SAT 10 (L) 16 - 45 % (calc)   Ferritin 60 16 - 232 ng/mL  Hemoglobin A1c     Status: Abnormal   Collection Time: 06/03/24 11:22 AM  Result Value Ref Range   Hgb A1c MFr Bld 6.0 (H) <5.7 %  Comment: For someone without known diabetes, a hemoglobin  A1c value between 5.7% and 6.4% is consistent with prediabetes and should be confirmed with a  follow-up test. . For someone with  known diabetes, a value <7% indicates that their diabetes is well controlled. A1c targets should be individualized based on duration of diabetes, age, comorbid conditions, and other considerations. . This assay result is consistent with an increased risk of diabetes. . Currently, no consensus exists regarding use of hemoglobin A1c for diagnosis of diabetes for children. .    Mean Plasma Glucose 126 mg/dL   eAG (mmol/L) 7.0 mmol/L  VITAMIN D  25 Hydroxy (Vit-D Deficiency, Fractures)     Status: Abnormal   Collection Time: 06/03/24 11:22 AM  Result Value Ref Range   Vit D, 25-Hydroxy 22 (L) 30 - 100 ng/mL    Comment: Vitamin D  Status         25-OH Vitamin D : . Deficiency:                    <20 ng/mL Insufficiency:             20 - 29 ng/mL Optimal:                 > or = 30 ng/mL . For 25-OH Vitamin D  testing on patients on  D2-supplementation and patients for whom quantitation  of D2 and D3 fractions is required, the [facility](TM) 25-OH VIT D, (D2,D3), LC/MS/MS is recommended: order  code 07111 (patients >44yrs). . See Note 1 . Note 1 . For additional information, please refer to  http://education.[facility].com/faq/FAQ199  (This link is being provided for informational/ educational purposes only.)     Diabetic Foot Exam:     PHQ2/9:    08/23/2024   10:39 AM 06/03/2024   10:41 AM 04/26/2024   10:48 AM 10/19/2023    3:47 PM 10/19/2023    8:25 AM  Depression screen PHQ 2/9  Decreased Interest 0 0 0 0 0  Down, Depressed, Hopeless 0 0 0 0 0  PHQ - 2 Score 0 0 0 0 0  Altered sleeping 0 0 0 2 0  Tired, decreased energy 0 0 0 0 0  Change in appetite 0 0 0 0 0  Feeling bad or failure about yourself  0 0 0 0 0  Trouble concentrating 0 0 0 0 0  Moving slowly or fidgety/restless 0 0 0 0 0  Suicidal thoughts 0 0 0 0 0  PHQ-9 Score 0 0 0 2 0  Difficult doing work/chores Not difficult at all Not difficult at all Not difficult at all      phq 9 is  negative  Fall Risk:    08/23/2024   10:39 AM 06/03/2024   10:41 AM 10/10/2023    1:12 PM 05/31/2023    9:11 AM 10/28/2022    8:42 AM  Fall Risk   Falls in the past year? 0 0 0 0 0  Number falls in past yr: 0 0   0  Injury with Fall? 0 0   0  Risk for fall due to : No Fall Risks No Fall Risks No Fall Risks No Fall Risks No Fall Risks  Follow up Falls evaluation completed Falls prevention discussed;Education provided;Falls evaluation completed Falls prevention discussed Falls prevention discussed Falls prevention discussed;Education provided;Falls evaluation completed      Data saved with a previous flowsheet row definition      Assessment & Plan Morbid obesity due to  excess calories with metabolic syndrome, prediabetes, and dyslipidemia Weight increased to 286.9 lbs. Insurance requires documentation of weight loss efforts. Previous inconsistent GLP-1 agonist use due to insurance. Discussed reducing soda and increasing activity. Explained ultra-processed food addiction and eliminating sweet beverages. - Provide Mounjaro  samples of 2.5 mg  as Zepbound  substitute. - Submit prior authorization for Zepbound . - Encourage portion control and reduction of sweet beverages. - Recommend tracking physical activity and dietary intake. - Schedule follow-up in three months with a goal of losing 8 pounds.  Periodic limb movement disorder and insomnia Periodic limb movement disorder managed with Mirapex . Insomnia managed with QVV as needed. - Continue Mirapex  as needed for periodic limb movement disorder. - Refill QVV for insomnia management.  Chronic low back pain Chronic low back pain managed with cyclobenzaprine  for muscle spasms. - Continue cyclobenzaprine  for muscle spasms.  Gastroesophageal reflux disease (GERD) GERD well-controlled with pantoprazole  as needed. - Continue pantoprazole  as needed for GERD.  Vitamin D  deficiency Vitamin D  deficiency not regularly supplemented. - Resume  vitamin D  supplementation.

## 2024-08-27 ENCOUNTER — Other Ambulatory Visit (HOSPITAL_COMMUNITY): Payer: Self-pay

## 2024-08-28 ENCOUNTER — Other Ambulatory Visit (HOSPITAL_COMMUNITY): Payer: Self-pay

## 2024-08-29 ENCOUNTER — Other Ambulatory Visit (HOSPITAL_COMMUNITY): Payer: Self-pay

## 2024-09-15 ENCOUNTER — Other Ambulatory Visit: Payer: Self-pay | Admitting: Family Medicine

## 2024-09-15 DIAGNOSIS — K219 Gastro-esophageal reflux disease without esophagitis: Secondary | ICD-10-CM

## 2024-09-17 ENCOUNTER — Other Ambulatory Visit (HOSPITAL_COMMUNITY): Payer: Self-pay

## 2024-09-19 ENCOUNTER — Encounter: Payer: Self-pay | Admitting: Family Medicine

## 2024-09-25 ENCOUNTER — Ambulatory Visit (INDEPENDENT_AMBULATORY_CARE_PROVIDER_SITE_OTHER): Admitting: Podiatry

## 2024-09-25 ENCOUNTER — Ambulatory Visit

## 2024-09-25 DIAGNOSIS — M7672 Peroneal tendinitis, left leg: Secondary | ICD-10-CM

## 2024-09-25 DIAGNOSIS — M722 Plantar fascial fibromatosis: Secondary | ICD-10-CM | POA: Diagnosis not present

## 2024-09-25 NOTE — Progress Notes (Signed)
 Subjective:  Patient ID: Connie Sims, female    DOB: 04/27/1981,  MRN: [id number] HPI Chief Complaint  Patient presents with   Foot Pain    Pain in sole of left foot.    43 y.o. female presents with the above complaint.   ROS: Denies fever chills nausea mobic muscle aches pains calf pain back pain chest pain shortness of breath.  States that has been going on for couple of weeks however Monday was at its worst.  She states is feeling better now.  Past Medical History:  Diagnosis Date   Allergic rhinitis    Allergic rhinitis with postnasal drip    Allergy    Dust, pollen, weeds   Congenital osteopetrosis    previously managed by specialist at [facility], periodic hearing tests   Diabetes mellitus without complication (HCC)    Epigastric pain    Fibroids    Foot fracture, right    Jones fx 5th metatarsal   GERD (gastroesophageal reflux disease)    Headache    Impaired fasting glucose    Insomnia    Low back pain    LUQ pain    Microcytosis    Morbid obesity with BMI of 40.0-44.9, adult (HCC)    PCOS (polycystic ovarian syndrome)    Past Surgical History:  Procedure Laterality Date   ABDOMINAL HYSTERECTOMY  Jan 02, 2024   NASAL SINUS SURGERY     ROBOTIC ASSISTED LAPAROSCOPIC HYSTERECTOMY AND SALPINGECTOMY Bilateral 01/02/2024   Procedure: XI ROBOTIC ASSISTED LAPAROSCOPIC HYSTERECTOMY AND BILATERAL SALPINGECTOMY;  Surgeon: Eldonna Mays, MD;  Location: [facility];  Service: Gynecology;  Laterality: Bilateral;   SENTINEL NODE BIOPSY N/A 01/02/2024   Procedure: BILATERAL SENTINEL NODE BIOPSY;  Surgeon: Eldonna Mays, MD;  Location: [facility];  Service: Gynecology;  Laterality: N/A;    Current Outpatient Medications:    cyclobenzaprine  (FLEXERIL ) 10 MG tablet, Take 1 tablet (10 mg total) by mouth at bedtime., Disp: 90 tablet, Rfl: 0   Daridorexant  HCl (QUVIVIQ ) 25 MG TABS, Take 1 tablet (25 mg total) by mouth at bedtime., Disp: 90 tablet, Rfl: 0   pantoprazole  (PROTONIX ) 40 MG  tablet, TAKE 1 TABLET BY MOUTH DAILY BEFORE BREAKFAST, Disp: 90 tablet, Rfl: 0   pramipexole  (MIRAPEX ) 0.5 MG tablet, Take 1 tablet (0.5 mg total) by mouth at bedtime., Disp: 90 tablet, Rfl: 0   tirzepatide  (ZEPBOUND ) 5 MG/0.5ML Pen, Inject 5 mg into the skin once a week., Disp: 2 mL, Rfl: 0  No Known Allergies Review of Systems Objective:  There were no vitals filed for this visit.  General: Well developed, nourished, in no acute distress, alert and oriented x3   Dermatological: Skin is warm, dry and supple bilateral. Nails x 10 are well maintained; remaining integument appears unremarkable at this time. There are no open sores, no preulcerative lesions, no rash or signs of infection present.  Vascular: Dorsalis Pedis artery and Posterior Tibial artery pedal pulses are 2/4 bilateral with immedate capillary fill time. Pedal hair growth present. No varicosities and no lower extremity edema present bilateral.   Neruologic: Grossly intact via light touch bilateral. Vibratory intact via tuning fork bilateral. Protective threshold with Semmes Wienstein monofilament intact to all pedal sites bilateral. Patellar and Achilles deep tendon reflexes 2+ bilateral. No Babinski or clonus noted bilateral.   Musculoskeletal: No gross boney pedal deformities bilateral. No pain, crepitus, or limitation noted with foot and ankle range of motion bilateral. Muscular strength 5/5 in all groups tested bilateral.  No pain on  palpation or range of motion of the foot other than plantarflexion and eversion and that is in the plantar surface of the foot near the base of the fourth metatarsal.  Most likely peroneus longus tendon.  Gait: Unassisted, Nonantalgic.    Radiographs:  Radiographs taken today demonstrate osseously mature individual with no significant osseous abnormalities good bone mineralization and no arthritic changes.  No acute findings are noted.  Assessment & Plan:   Assessment: Peroneus longus  tendinitis left  Plan: I recommended oral anti-inflammatories nonsteroidals.  She is also going to obtain a boot from [employer].  And we discussed icing.  I like to follow-up with her in 1 month.     Lasonja Lakins T. [place], [place]

## 2024-10-09 ENCOUNTER — Other Ambulatory Visit (HOSPITAL_COMMUNITY): Payer: Self-pay

## 2024-10-10 ENCOUNTER — Other Ambulatory Visit (HOSPITAL_COMMUNITY): Payer: Self-pay

## 2024-10-23 ENCOUNTER — Ambulatory Visit: Admitting: Podiatry

## 2024-11-04 ENCOUNTER — Ambulatory Visit

## 2024-11-25 ENCOUNTER — Ambulatory Visit: Admitting: Family Medicine

## 2024-11-25 DIAGNOSIS — E785 Hyperlipidemia, unspecified: Secondary | ICD-10-CM

## 2024-11-25 DIAGNOSIS — G4709 Other insomnia: Secondary | ICD-10-CM

## 2024-11-25 DIAGNOSIS — K219 Gastro-esophageal reflux disease without esophagitis: Secondary | ICD-10-CM

## 2024-11-25 DIAGNOSIS — E8881 Metabolic syndrome: Secondary | ICD-10-CM

## 2024-11-25 DIAGNOSIS — Z23 Encounter for immunization: Secondary | ICD-10-CM

## 2024-11-25 DIAGNOSIS — G4761 Periodic limb movement disorder: Secondary | ICD-10-CM

## 2024-11-25 DIAGNOSIS — G8929 Other chronic pain: Secondary | ICD-10-CM

## 2024-11-25 DIAGNOSIS — E559 Vitamin D deficiency, unspecified: Secondary | ICD-10-CM

## 2024-11-25 MED ORDER — PANTOPRAZOLE SODIUM 40 MG PO TBEC: 40.0000 mg | DELAYED_RELEASE_TABLET | Freq: Every day | ORAL | 0 refills | Status: AC

## 2024-11-25 MED ORDER — QUVIVIQ 25 MG PO TABS: 25.0000 mg | ORAL_TABLET | Freq: Every day | ORAL | 0 refills | Status: AC

## 2024-11-25 MED ORDER — PRAMIPEXOLE DIHYDROCHLORIDE 0.5 MG PO TABS: 0.5000 mg | ORAL_TABLET | Freq: Every day | ORAL | 1 refills | Status: AC

## 2024-11-25 MED ORDER — CYCLOBENZAPRINE HCL 10 MG PO TABS: 10.0000 mg | ORAL_TABLET | Freq: Every day | ORAL | 1 refills | Status: AC

## 2024-11-25 MED ORDER — PHENTERMINE-TOPIRAMATE ER 3.75-23 MG PO CP24: 1.0000 | ORAL_CAPSULE | ORAL | 0 refills | Status: AC

## 2024-11-25 MED ORDER — PHENTERMINE-TOPIRAMATE ER 7.5-46 MG PO CP24: 1.0000 | ORAL_CAPSULE | ORAL | 1 refills | Status: AC

## 2024-11-25 NOTE — Progress Notes (Signed)
 Name: Connie Sims   MRN: [id number]    DOB: 02-Feb-1981   Date:11/25/2024       Progress Note  Subjective  Chief Complaint  Chief Complaint  Patient presents with   Medical Management of Chronic Issues   Discussed the use of AI scribe software for clinical note transcription with the patient, who gave verbal consent to proceed.  History of Present Illness Connie Sims is a 43 year old female with morbid obesity and metabolic syndrome who presents for a follow-up regarding Zepbound  treatment.  She has been unable to maintain Zepbound  treatment for weight management due to insurance issues, despite experiencing appetite suppression while on the medication. She is exploring other options, including microdosing at clinics, but is uncertain about their efficacy.  She has a history of metabolic syndrome with a slightly elevated A1c, dyslipidemia with elevated LDL and triglycerides, and low HDL. Her BMI is 49.57, and she has gained approximately 10 pounds since June. She acknowledges the need to increase physical activity and has a treadmill at home but has not been using it regularly.  She has a history of vitamin D  deficiency and has not been taking her supplements regularly.  She has restless leg syndrome, for which she takes Mirapex  0.5 mg, which is effective. She also has a prescription for Quviviq  for sleep but has not been using it frequently but still helps her fall and stay asleep, she has tried multiple medications but Quviviq  works the best for her.. She generally sleeps well, usually falling asleep by 11 PM  She experiences lower back pain localized to the lower lumbar area, which she attributes to inflammation and her weight. She uses cyclobenzaprine  for muscle spasms related to this pain.  She has a history of gastroesophageal reflux disease (GERD) and uses pantoprazole  as needed, particularly when consuming foods that may trigger symptoms, such as tomato-based products. She  avoids spicy foods due to intolerance.  She has a history of anemia, which has resolved, and her iron  storage levels are currently adequate. No current bleeding issues. She has a history of negative sleep apnea testing but was diagnosed with restless leg syndrome during the study. No current symptoms of sleep apnea.    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   PCO (polycystic ovaries)    Allergic rhinitis    Class 3 severe obesity with serious comorbidity and body mass index (BMI) of 40.0 to 44.9 in adult [facility])    Insomnia    Low back pain     Past Surgical History:  Procedure Laterality Date   ABDOMINAL HYSTERECTOMY  Jan 02, 2024   NASAL SINUS SURGERY     ROBOTIC ASSISTED LAPAROSCOPIC HYSTERECTOMY AND SALPINGECTOMY Bilateral 01/02/2024   Procedure: XI ROBOTIC ASSISTED LAPAROSCOPIC HYSTERECTOMY AND BILATERAL SALPINGECTOMY;  Surgeon: Eldonna Mays, MD;  Location: [facility];  Service: Gynecology;  Laterality: Bilateral;   SENTINEL NODE BIOPSY N/A 01/02/2024   Procedure: BILATERAL SENTINEL NODE BIOPSY;  Surgeon: Eldonna Mays, MD;  Location: [facility];  Service: Gynecology;  Laterality: N/A;    Family History  Problem Relation Age of Onset   Liver disease Mother        fatty liver, s/p liver biopsy   Alcohol abuse Mother    Uterine cancer Maternal Aunt    Hypertension Maternal Grandmother    Glaucoma Maternal Grandmother     Diabetes Maternal Grandfather    Heart disease Maternal Grandfather    Breast cancer Neg Hx    Ovarian cancer Neg Hx    Colon cancer Neg Hx    Pancreatic cancer Neg Hx    Prostate cancer Neg Hx     Social History   Tobacco Use   Smoking status: Never   Smokeless tobacco: Never  Substance Use Topics   Alcohol use: Never     Current Outpatient Medications:    cyclobenzaprine  (FLEXERIL ) 10 MG tablet, Take 1 tablet (10 mg total) by mouth at bedtime., Disp: 90 tablet, Rfl: 0   Daridorexant  HCl (QUVIVIQ ) 25 MG TABS, Take 1 tablet (25 mg total) by mouth at bedtime., Disp: 90 tablet, Rfl: 0   pantoprazole  (PROTONIX ) 40 MG tablet, TAKE 1 TABLET BY MOUTH DAILY BEFORE BREAKFAST, Disp: 90 tablet, Rfl: 0   pramipexole  (MIRAPEX ) 0.5 MG tablet, Take 1 tablet (0.5 mg total) by mouth at bedtime., Disp: 90 tablet, Rfl: 0  No Known Allergies  I personally reviewed active problem list, medication list, allergies, family history with the patient/caregiver today.   ROS  Ten systems reviewed and is negative except as mentioned in HPI    Objective Physical Exam MEASUREMENTS: Weight- 288, BMI- 49.57. CONSTITUTIONAL: Patient appears well-developed and well-nourished.  No distress. HEENT: Head atraumatic, normocephalic, neck supple. CARDIOVASCULAR: Normal rate, regular rhythm and normal heart sounds.  No murmur heard. No BLE edema. PULMONARY: Effort normal and breath sounds normal. No respiratory distress. ABDOMINAL: There is no tenderness or distention. MUSCULOSKELETAL: Normal gait. Without gross motor or sensory deficit. PSYCHIATRIC: Patient has a normal mood and affect. behavior is normal. Judgment and thought content normal.  Vitals:   11/25/24 1533  BP: 128/78  Pulse: 65  Resp: 16  SpO2: 97%  Weight: 288 lb 12.8 oz (131 kg)  Height: 5' 4 (1.626 m)    Body mass index is 49.57 kg/m.    PHQ2/9:    11/25/2024    3:31 PM 08/23/2024   10:39 AM 06/03/2024   10:41 AM 04/26/2024    10:48 AM 10/19/2023    3:47 PM  Depression screen PHQ 2/9  Decreased Interest 0 0 0 0 0  Down, Depressed, Hopeless 0 0 0 0 0  PHQ - 2 Score 0 0 0 0 0  Altered sleeping  0 0 0 2  Tired, decreased energy  0 0 0 0  Change in appetite  0 0 0 0  Feeling bad or failure about yourself   0 0 0 0  Trouble concentrating  0 0 0 0  Moving slowly or fidgety/restless  0 0 0 0  Suicidal thoughts  0 0 0 0  PHQ-9 Score  0  0  0  2   Difficult doing work/chores  Not difficult at all Not difficult at all Not difficult at all      Data saved with a previous flowsheet row definition    phq 9 is negative  Fall Risk:    11/25/2024    3:31 PM 08/23/2024   10:39 AM 06/03/2024   10:41 AM 10/10/2023    1:12  PM 05/31/2023    9:11 AM  Fall Risk   Falls in the past year? 0 0 0 0 0  Number falls in past yr: 0 0 0    Injury with Fall? 0 0  0     Risk for fall due to : No Fall Risks No Fall Risks No Fall Risks No Fall Risks No Fall Risks  Follow up Falls evaluation completed Falls evaluation completed Falls prevention discussed;Education provided;Falls evaluation completed Falls prevention discussed Falls prevention discussed     Data saved with a previous flowsheet row definition     Assessment & Plan Morbid obesity with metabolic syndrome and dyslipidemia Morbid obesity with BMI 49-57, metabolic syndrome, and dyslipidemia. Previous Zepbound  use was effective but unsustainable due to insurance. Discussed Rybelsus  as a more affordable option. Emphasized weight loss for metabolic improvement. Considered Qsymia with prior authorization, requiring 5% weight loss in three months for continued approval. - Prescribed Qsymia with prior authorization, starting with a lower dose. - Encouraged routine exercise, starting with 5-10 minutes daily. - Advised dietary modifications including portion control and reducing soda intake. - Discussed potential use of Rybelsus  for weight loss.  Vitamin D  deficiency Noted  deficiency with no current supplementation. Emphasized year-round supplementation, especially in winter. - Recommended resuming vitamin D  supplementation year-round.  Gastroesophageal reflux disease Managed with pantoprazole  as needed for trigger foods like tomato-based products. - Prescribed pantoprazole  90 tablets for as-needed use.  Periodic limb movement disorder and insomnia Periodic limb movement disorder managed with Mirapex  0.5 mg effectively. Insomnia managed with Quviviq  as needed, reduced use due to effective Mirapex . - Continue Mirapex  0.5 mg for restless leg syndrome. - Prescribed Quviviq  25 mg as needed for sleep.  Chronic low back pain Managed with Flexeril  for muscle spasms, likely exacerbated by obesity. - Continue Flexeril  for muscle spasms as needed.  Flu shot today

## 2024-11-26 ENCOUNTER — Other Ambulatory Visit (HOSPITAL_COMMUNITY): Payer: Self-pay

## 2024-11-26 ENCOUNTER — Telehealth: Payer: Self-pay | Admitting: Pharmacy Technician

## 2024-11-26 NOTE — Telephone Encounter (Signed)
 Pharmacy Patient Advocate Encounter  Received notification from [facility] that Prior Authorization for Phentermine-Topiramate ER 3.75-23MG  er capsules has been APPROVED from 11/26/24 to 05/27/25. Ran test claim, Copay is $0.00. This test claim was processed through [facility]- copay amounts may vary at other pharmacies due to pharmacy/plan contracts, or as the patient moves through the different stages of their insurance plan.   PA #/Case ID/Reference #: [id number]

## 2024-11-26 NOTE — Telephone Encounter (Signed)
 Pharmacy Patient Advocate Encounter   Received notification from CoverMyMeds that prior authorization for Phentermine-Topiramate ER 3.75-23MG  er capsules is required/requested.   Insurance verification completed.   The patient is insured through [facility].   Per test claim: PA required; PA started via CoverMyMeds. KEY [id number] . Waiting for clinical questions to populate.

## 2024-11-27 ENCOUNTER — Other Ambulatory Visit (HOSPITAL_COMMUNITY): Payer: Self-pay

## 2024-11-27 ENCOUNTER — Telehealth: Payer: Self-pay | Admitting: Pharmacy Technician

## 2024-11-27 NOTE — Telephone Encounter (Signed)
 Pharmacy Patient Advocate Encounter   Received notification from Onbase that prior authorization for Quviviq  25MG  tabletsis required/requested.   Insurance verification completed.   The patient is insured through [facility].   Per test claim: PA required; PA started via CoverMyMeds. KEY [id number] . Waiting for clinical questions to populate.

## 2024-11-29 ENCOUNTER — Other Ambulatory Visit (HOSPITAL_COMMUNITY): Payer: Self-pay

## 2024-11-29 NOTE — Telephone Encounter (Signed)
 Pharmacy Patient Advocate Encounter  Received notification from [employer] that Prior Authorization for Quviviq  25MG  tablets has been DENIED.  Full denial letter will be uploaded to the media tab. See denial reason below.    PA #/Case ID/Reference #: [id number]

## 2024-12-03 ENCOUNTER — Other Ambulatory Visit (HOSPITAL_COMMUNITY): Payer: Self-pay

## 2024-12-10 ENCOUNTER — Ambulatory Visit
Admission: RE | Admit: 2024-12-10 | Discharge: 2024-12-10 | Disposition: A | Source: Ambulatory Visit | Attending: Family Medicine | Admitting: Family Medicine

## 2024-12-10 DIAGNOSIS — N6312 Unspecified lump in the right breast, upper inner quadrant: Secondary | ICD-10-CM

## 2024-12-10 DIAGNOSIS — Z1231 Encounter for screening mammogram for malignant neoplasm of breast: Secondary | ICD-10-CM | POA: Insufficient documentation

## 2024-12-20 ENCOUNTER — Other Ambulatory Visit (HOSPITAL_COMMUNITY): Payer: Self-pay

## 2025-01-14 ENCOUNTER — Other Ambulatory Visit (HOSPITAL_COMMUNITY): Payer: Self-pay

## 2025-02-24 ENCOUNTER — Ambulatory Visit: Admitting: Family Medicine
